# Patient Record
Sex: Male | Born: 1950
Health system: Southern US, Community
[De-identification: ages and names within clinical notes are randomized; demographics above are authoritative.]

## PROBLEM LIST (undated history)

## (undated) DIAGNOSIS — I209 Angina pectoris, unspecified: Secondary | ICD-10-CM

## (undated) DIAGNOSIS — M1990 Unspecified osteoarthritis, unspecified site: Secondary | ICD-10-CM

## (undated) DIAGNOSIS — E0590 Thyrotoxicosis, unspecified without thyrotoxic crisis or storm: Secondary | ICD-10-CM

## (undated) DIAGNOSIS — E7800 Pure hypercholesterolemia, unspecified: Secondary | ICD-10-CM

## (undated) DIAGNOSIS — K8020 Calculus of gallbladder without cholecystitis without obstruction: Secondary | ICD-10-CM

## (undated) DIAGNOSIS — K219 Gastro-esophageal reflux disease without esophagitis: Secondary | ICD-10-CM

## (undated) DIAGNOSIS — J449 Chronic obstructive pulmonary disease, unspecified: Secondary | ICD-10-CM

## (undated) DIAGNOSIS — D126 Benign neoplasm of colon, unspecified: Secondary | ICD-10-CM

## (undated) DIAGNOSIS — R51 Headache: Secondary | ICD-10-CM

## (undated) HISTORY — PX: BLADDER NECK RECONSTRUCTION: SHX1239

## (undated) HISTORY — DX: Benign neoplasm of colon, unspecified: D12.6

## (undated) HISTORY — PX: HAND SURGERY: SHX662

## (undated) HISTORY — DX: Calculus of gallbladder without cholecystitis without obstruction: K80.20

## (undated) HISTORY — PX: HERNIA REPAIR: SHX51

## (undated) HISTORY — PX: CARDIAC CATHETERIZATION: SHX172

## (undated) HISTORY — PX: TONSILLECTOMY AND ADENOIDECTOMY: SUR1326

---
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
## 1979-02-26 HISTORY — PX: HAND SURGERY: SHX662

## 1991-06-28 HISTORY — PX: CARPAL TUNNEL RELEASE: SHX101

## 1992-06-27 HISTORY — PX: ANTERIOR CERVICAL DECOMP/DISCECTOMY FUSION: SHX1161

## 1999-09-07 ENCOUNTER — Encounter: Payer: Self-pay | Admitting: Cardiology

## 1999-09-07 ENCOUNTER — Inpatient Hospital Stay (HOSPITAL_COMMUNITY): Admission: AD | Admit: 1999-09-07 | Discharge: 1999-09-08 | Payer: Self-pay | Admitting: Cardiology

## 2000-02-19 ENCOUNTER — Emergency Department (HOSPITAL_COMMUNITY): Admission: EM | Admit: 2000-02-19 | Discharge: 2000-02-19 | Payer: Self-pay | Admitting: Emergency Medicine

## 2000-02-19 ENCOUNTER — Encounter: Payer: Self-pay | Admitting: Emergency Medicine

## 2001-10-12 ENCOUNTER — Emergency Department (HOSPITAL_COMMUNITY): Admission: EM | Admit: 2001-10-12 | Discharge: 2001-10-12 | Payer: Self-pay | Admitting: Emergency Medicine

## 2001-10-12 ENCOUNTER — Encounter: Payer: Self-pay | Admitting: Emergency Medicine

## 2002-08-10 ENCOUNTER — Encounter: Payer: Self-pay | Admitting: Emergency Medicine

## 2002-08-10 ENCOUNTER — Emergency Department (HOSPITAL_COMMUNITY): Admission: EM | Admit: 2002-08-10 | Discharge: 2002-08-10 | Payer: Self-pay | Admitting: Emergency Medicine

## 2003-09-20 ENCOUNTER — Emergency Department (HOSPITAL_COMMUNITY): Admission: EM | Admit: 2003-09-20 | Discharge: 2003-09-21 | Payer: Self-pay | Admitting: Emergency Medicine

## 2005-08-02 ENCOUNTER — Ambulatory Visit: Payer: Self-pay

## 2007-10-22 ENCOUNTER — Ambulatory Visit: Payer: Self-pay | Admitting: Gastroenterology

## 2007-10-26 DIAGNOSIS — D126 Benign neoplasm of colon, unspecified: Secondary | ICD-10-CM

## 2007-10-26 HISTORY — DX: Benign neoplasm of colon, unspecified: D12.6

## 2007-11-02 ENCOUNTER — Ambulatory Visit: Payer: Self-pay | Admitting: Gastroenterology

## 2007-11-02 ENCOUNTER — Encounter: Payer: Self-pay | Admitting: Gastroenterology

## 2007-11-05 ENCOUNTER — Encounter: Payer: Self-pay | Admitting: Gastroenterology

## 2008-11-10 ENCOUNTER — Encounter: Admission: RE | Admit: 2008-11-10 | Discharge: 2008-11-10 | Payer: Self-pay | Admitting: Neurological Surgery

## 2008-11-15 ENCOUNTER — Emergency Department (HOSPITAL_COMMUNITY): Admission: EM | Admit: 2008-11-15 | Discharge: 2008-11-15 | Payer: Self-pay | Admitting: Emergency Medicine

## 2008-11-24 ENCOUNTER — Emergency Department (HOSPITAL_COMMUNITY): Admission: EM | Admit: 2008-11-24 | Discharge: 2008-11-25 | Payer: Self-pay | Admitting: Emergency Medicine

## 2010-11-12 NOTE — Cardiovascular Report (Signed)
[place]  Patient:    Gerald Salas, Gerald Salas                     MRN: [id number] Proc. Date: 09/08/99 Adm. Date:  [id number] Attending:  Talitha Givens CC:         Moshe Cipro. Gayla Doss, M.D.             Lewayne Bunting, M.D.                        Cardiac Catheterization  REFERRING PHYSICIAN:  Moshe Cipro. Gayla Doss, M.D.  DATE OF BIRTH:  July 27, 1950  DIAGNOSES: 1. No flow-limiting coronary artery disease. 2. Normal left ventricular contraction.  INDICATIONS:  [name] is a 60 year old white male with a history of tobacco use and no prior heart disease who presented with increased substernal chest pain over the last several days.  The patient was initially seen in the office and was given antianginal therapy.  Due to continued pain, however, the patient was admitted to the hospital and referred for diagnostic catheterization to access his coronary anatomy.  DESCRIPTION OF PROCEDURE:  After informed consent was obtained, the patient was  brought to the catheterization laboratory.  The right groin was sterilely prepared and draped.  Lidocaine 1% was used to infiltrate the right groin and a 6 French  arterial sheath was placed using modified Seldinger technique.  Subsequently, JL 3.5 and JR4 catheters were used to engage the left and right coronary arteries.  Selective angiography was performed in various projections using manual injections of contrast.  After coronary angiography, attention was turned to ventriculography and a 6 French pigtail catheter was advanced to the femoral artery into the left ventricle.  Appropriate left heart hemodynamics were obtained.  A left ventriculogram was then performed using power injections of contrast.  The catheter was then pulled back to evaluate for an aortic valve gradient.  After removal of the pigtail catheter, the sheath was removed and manual pressure applied until adequate hemostasis was  achieved.  The patient tolerated the procedure well and was transferred to the floor in stable condition.  FINDINGS: 1. Hemodynamics:  LV pressure is 85/16 mmHg.  Aortic pressure is 95/52 mmHg. 2. Ventriculography performed in single plane RAO projection.  No wall motion    abnormalities.  Left ventricular ejection fraction was 60%.  SELECTIVE CORONARY ANGIOGRAPHY: 1. Left main coronary artery was a large caliber vessel with no flow-limiting    disease. 2. Left anterior descending was a small caliber vessel with rapid tapering but    no flow-limiting coronary arteries. 3. Left circumflex coronary artery was a large vessel with two obtuse marginal    branches without flow-limiting coronary arteries. 4. Right coronary artery:  The coronary system is right dominant with large    posterior descending artery, 2+ marginal branches taken off from distal    right coronary artery.  There was no flow-limiting coronary artery disease.  CONCLUSIONS: 1. No evidence of flow-limiting epicardial coronary artery disease. 2. Normal left ventricular systolic function.  RECOMMENDATIONS:  The results have been discussed with the patient and his wife. The plan is to discharge the patient later tonight after adequate hemostasis has been achieved.  The patient will be followed by Dr. Andee Lineman in the future and will need further risk factor modification.  It appears that the patients substernal  chest pain, however, is not related to atherosclerotic coronary artery  disease.  DD:  09/08/99 TD:  09/08/99 Job: [id number]

## 2010-11-12 NOTE — Discharge Summary (Signed)
[place]  Patient:    Gerald Salas, Gerald Salas                     MRN: [id number] Adm. Date:  [id number] Disc. Date: 09/08/99 Attending:  Talitha Givens Dictator:   Joellyn Rued, P.A.C. CC:         Lewayne Bunting, M.D.             Moshe Cipro Gayla Doss, M.D. - Rosiland Oz                           Discharge Summary  BRIEF HISTORY:  [name] is a 60 year old white male who was referred by Dr. Lolita Lenz for evaluation and chest discomfort.  He describes the discomfort as increasing in frequency, for the past several weeks.  He called it a jabbing pain that suddenly occurs in the mid chest and lasts for only a second.  He has also noticed increasing fatigue over the last few weeks unassociated with the above symptoms.  He also describes the discomfort as a knife sticking in his chest.  He does not have any exertional component.  It is also reproducible on an abdominal exam.  PAST HISTORY:  Notable for a neck fracture in 1994 due to a work injury, tonsillectomy, hand surgery, tobacco abuse.  He was referred for a stress test but evidently due to his continuing chest discomfort he showed up at the emergency room for further evaluation.  LABORATORY DATA:  Serial CKs and enzymes negative for myocardial infarction. WBC 8.7, H&H 15.7/44.4 normal indices.  Platelets 258, sodium 140, potassium 4.2, glucose 92, BUN 12, creatinine 1.1.  Fasting cholesterols elevated 211, triglycerides at 239, HDL 35, LDL 128, ratio 6.0.  X-rays are not on the chart.  EKGs show sinus bradycardia, and LVH.  HOSPITAL COURSE:  [name] was admitted for cardiac catheterization.  This was performed on September 08, 1999.  This did not show any coronary artery disease and the ejection fraction was 60%.  Post sheath removal and bed rest it was felt that the patient could be discharged home with the diagnosis of:  DISCHARGE DIAGNOSES: 1. Noncardiac chest discomfort. 2.  Hyperlipidemia.  DISPOSITION:  He is discharged home and asked to continue his home medications which include Zantac.  He was asked not to take the beta blocker.  He is advised no lifting, driving, sexual activity, or heavy exertion for 2 days.  DIET: Maintain low salt, fat, cholesterol diet.  WOUND CARE:  If he has difficulty with his catheterization site he was asked to call immediately and advised to discontinue tobacco.  FOLLOWUP:  He will arrange a follow up appointment with Dr. Lolita Lenz.  We will leave it up to Dr. Lolita Lenz if he would like to begin cholesterol therapy. DD:  09/08/99 TD:  09/08/99 Job: [id number]

## 2010-12-25 ENCOUNTER — Emergency Department (HOSPITAL_COMMUNITY)
Admission: EM | Admit: 2010-12-25 | Discharge: 2010-12-25 | Disposition: A | Discharge status: Home / Self Care | Payer: Medicare Other | Attending: Emergency Medicine | Admitting: Emergency Medicine

## 2010-12-25 DIAGNOSIS — H5710 Ocular pain, unspecified eye: Secondary | ICD-10-CM | POA: Insufficient documentation

## 2010-12-25 DIAGNOSIS — S058X9A Other injuries of unspecified eye and orbit, initial encounter: Secondary | ICD-10-CM | POA: Insufficient documentation

## 2010-12-25 DIAGNOSIS — X58XXXA Exposure to other specified factors, initial encounter: Secondary | ICD-10-CM | POA: Insufficient documentation

## 2011-02-17 ENCOUNTER — Other Ambulatory Visit: Payer: Self-pay

## 2011-02-17 ENCOUNTER — Emergency Department (HOSPITAL_COMMUNITY): Payer: Medicare Other

## 2011-02-17 ENCOUNTER — Encounter: Payer: Self-pay | Admitting: Emergency Medicine

## 2011-02-17 ENCOUNTER — Emergency Department (HOSPITAL_COMMUNITY)
Admission: EM | Admit: 2011-02-17 | Discharge: 2011-02-17 | Disposition: A | Discharge status: Other Acute Inpatient Hospital | Payer: Medicare Other | Source: Home / Self Care | Attending: Emergency Medicine | Admitting: Emergency Medicine

## 2011-02-17 ENCOUNTER — Inpatient Hospital Stay (HOSPITAL_COMMUNITY)
Admission: AD | Admit: 2011-02-17 | Discharge: 2011-02-18 | DRG: 287 | Disposition: A | Discharge status: Home / Self Care | Payer: Medicare Other | Source: Other Acute Inpatient Hospital | Attending: Cardiovascular Disease | Admitting: Cardiovascular Disease

## 2011-02-17 DIAGNOSIS — F17200 Nicotine dependence, unspecified, uncomplicated: Secondary | ICD-10-CM | POA: Insufficient documentation

## 2011-02-17 DIAGNOSIS — R0789 Other chest pain: Principal | ICD-10-CM | POA: Diagnosis present

## 2011-02-17 DIAGNOSIS — E785 Hyperlipidemia, unspecified: Secondary | ICD-10-CM | POA: Diagnosis present

## 2011-02-17 DIAGNOSIS — R079 Chest pain, unspecified: Secondary | ICD-10-CM

## 2011-02-17 DIAGNOSIS — J449 Chronic obstructive pulmonary disease, unspecified: Secondary | ICD-10-CM | POA: Diagnosis present

## 2011-02-17 DIAGNOSIS — I200 Unstable angina: Secondary | ICD-10-CM

## 2011-02-17 DIAGNOSIS — I2510 Atherosclerotic heart disease of native coronary artery without angina pectoris: Secondary | ICD-10-CM | POA: Diagnosis present

## 2011-02-17 DIAGNOSIS — J4489 Other specified chronic obstructive pulmonary disease: Secondary | ICD-10-CM | POA: Diagnosis present

## 2011-02-17 DIAGNOSIS — E7800 Pure hypercholesterolemia, unspecified: Secondary | ICD-10-CM | POA: Insufficient documentation

## 2011-02-17 DIAGNOSIS — E0590 Thyrotoxicosis, unspecified without thyrotoxic crisis or storm: Secondary | ICD-10-CM | POA: Diagnosis present

## 2011-02-17 HISTORY — DX: Pure hypercholesterolemia, unspecified: E78.00

## 2011-02-17 LAB — CBC
Hemoglobin: 14.5 g/dL (ref 13.0–17.0)
MCH: 33.1 pg (ref 26.0–34.0)
Platelets: 194 10*3/uL (ref 150–400)
Platelets: 173 10*3/uL (ref 150–400)
RBC: 4.65 MIL/uL (ref 4.22–5.81)
RBC: 4.38 MIL/uL (ref 4.22–5.81)
RDW: 11.9 % (ref 11.5–15.5)
WBC: 9.1 10*3/uL (ref 4.0–10.5)
WBC: 9.1 10*3/uL (ref 4.0–10.5)

## 2011-02-17 LAB — BASIC METABOLIC PANEL
CO2: 28 mEq/L (ref 19–32)
CO2: 27 mEq/L (ref 19–32)
Calcium: 10.1 mg/dL (ref 8.4–10.5)
Calcium: 9.4 mg/dL (ref 8.4–10.5)
Chloride: 107 mEq/L (ref 96–112)
GFR calc non Af Amer: >60 mL/min (ref >60)
Glucose, Bld: 108 mg/dL — ABNORMAL HIGH (ref 70–99)
Glucose, Bld: 104 mg/dL — ABNORMAL HIGH (ref 70–99)
Potassium: 4.1 mEq/L (ref 3.5–5.1)
Potassium: 3.6 mEq/L (ref 3.5–5.1)
Sodium: 143 mEq/L (ref 135–145)
Sodium: 140 mEq/L (ref 135–145)

## 2011-02-17 LAB — CARDIAC PANEL(CRET KIN+CKTOT+MB+TROPI)
CK, MB: 2.5 ng/mL (ref 0.3–4.0)
CK, MB: 2 ng/mL (ref 0.3–4.0)
Relative Index: INVALID (ref 0.0–2.5)
Total CK: 83 U/L (ref 7–232)
Total CK: 74 U/L (ref 7–232)
Troponin I: <0.3 ng/mL (ref <0.30)
Troponin I: <0.3 ng/mL (ref <0.30)

## 2011-02-17 LAB — DIFFERENTIAL
Basophils Absolute: 0 10*3/uL (ref 0.0–0.1)
Eosinophils Absolute: 0.3 10*3/uL (ref 0.0–0.7)
Lymphocytes Relative: 31 % (ref 12–46)
Lymphs Abs: 2.8 10*3/uL (ref 0.7–4.0)
Neutrophils Relative %: 57 % (ref 43–77)

## 2011-02-17 LAB — PROTIME-INR
INR: 0.97 (ref 0.00–1.49)
Prothrombin Time: 13.1 seconds (ref 11.6–15.2)

## 2011-02-17 MED ORDER — ASPIRIN 81 MG PO CHEW
324.0000 mg | CHEWABLE_TABLET | Freq: Once | ORAL | Status: AC
  Administered 2011-02-17: 324 mg via ORAL
  Filled 2011-02-17: qty 4

## 2011-02-17 MED ORDER — HEPARIN BOLUS VIA INFUSION
4000.0000 [IU] | Freq: Once | INTRAVENOUS | Status: AC
  Administered 2011-02-17: 4000 [IU] via INTRAVENOUS

## 2011-02-17 MED ORDER — NITROGLYCERIN 0.4 MG SL SUBL
0.4000 mg | SUBLINGUAL_TABLET | Freq: Once | SUBLINGUAL | Status: AC
  Administered 2011-02-17: 0.4 mg via SUBLINGUAL
  Filled 2011-02-17: qty 25

## 2011-02-17 MED ORDER — HEPARIN (PORCINE) IN NACL 100-0.45 UNIT/ML-% IJ SOLN
1000.0000 [IU]/h | INTRAMUSCULAR | Status: DC
  Administered 2011-02-17: 1000 [IU]/h via INTRAVENOUS
  Filled 2011-02-17: qty 250

## 2011-02-17 NOTE — ED Notes (Signed)
C/o mid sternal chest pain--onset this a.m. After awakening.  Describes as sharp, nonradiating and intermittent---no relieving factors--rates 5 on 1-10 scale--also reports SOA and nausea with pain.  He had a heart cath back in the 90"s which showed no blockages.

## 2011-02-17 NOTE — ED Notes (Signed)
Pt c/o cp since 0700 with sob/sweating. Denies n/v. Pt states he took tums with no relief.

## 2011-02-17 NOTE — ED Provider Notes (Signed)
History     CSN: [id number] Arrival date & time: 02/17/2011  1:01 PM  Chief Complaint  Patient presents with  . Chest Pain   Patient is a 60 y.o. male presenting with chest pain. The history is provided by the patient.  Chest Pain The chest pain began 6 - 12 hours ago. Chest pain occurs constantly. The chest pain is improving. The severity of the pain is moderate. The quality of the pain is described as dull. The pain does not radiate. Primary symptoms include shortness of breath. Pertinent negatives for primary symptoms include no cough, no abdominal pain, no nausea and no vomiting.  Pertinent negatives for associated symptoms include no numbness and no weakness. He tried antacids for the symptoms.   patient has had chest pain since he woke up this morning. He states that it is pressure over his lower chest. No relief with tums. He states that it doesn't feel like his previous GERD. He states that he was sweating. Mild dyspnea. No cough. He is a smoker.   Past Medical History  Diagnosis Date  . High cholesterol     Past Surgical History  Procedure Date  . Neck surgery     Family History  Problem Relation Age of Onset  . Cancer Mother   . Cancer Father   . Cancer Sister     History  Substance Use Topics  . Smoking status: Current Everyday Smoker -- 1.0 packs/day  . Smokeless tobacco: Not on file  . Alcohol Use: No      Review of Systems  Constitutional: Negative for activity change and appetite change.  HENT: Negative for neck stiffness.   Eyes: Negative for pain.  Respiratory: Positive for chest tightness and shortness of breath. Negative for cough.   Cardiovascular: Positive for chest pain. Negative for leg swelling.  Gastrointestinal: Negative for nausea, vomiting, abdominal pain and diarrhea.  Genitourinary: Negative for flank pain.  Musculoskeletal: Negative for back pain.  Skin: Negative for rash.  Neurological: Negative for weakness, numbness and headaches.    Psychiatric/Behavioral: Negative for behavioral problems.    Physical Exam  BP 146/73  Pulse 77  Temp 98.3 F (36.8 C)  Resp 18  Ht 5\' 11"  (1.803 m)  Wt 185 lb (83.915 kg)  BMI 25.80 kg/m2  SpO2 97%  Physical Exam  Nursing note and vitals reviewed. Constitutional: He is oriented to person, place, and time. He appears well-developed and well-nourished.  HENT:  Head: Normocephalic and atraumatic.  Eyes: EOM are normal. Pupils are equal, round, and reactive to light.  Neck: Normal range of motion. Neck supple.  Cardiovascular: Normal rate, regular rhythm and normal heart sounds.   No murmur heard. Pulmonary/Chest: Effort normal and breath sounds normal.  Abdominal: Soft. Bowel sounds are normal. He exhibits no distension and no mass. There is no tenderness. There is no rebound and no guarding.  Musculoskeletal: Normal range of motion. He exhibits no edema.  Neurological: He is alert and oriented to person, place, and time. No cranial nerve deficit.  Skin: Skin is warm and dry.    ED Course  Procedures    Date: 02/17/2011  Rate: 69  Rhythm: normal sinus rhythm  QRS Axis: normal  Intervals: normal  ST/T Wave abnormalities: normal  Conduction Disutrbances:none  Narrative Interpretation:   Old EKG Reviewed: unchanged Patient's Medications  New Prescriptions   No medications on file  Previous Medications   ACETAMINOPHEN (TYLENOL) 500 MG TABLET    Take 1,000 mg by mouth  every 6 (six) hours as needed. For pain    ASPIRIN-SALICYLAMIDE-CAFFEINE (BC HEADACHE POWDER PO)    Take 1 packet by mouth every 8 (eight) hours as needed. For pain   Modified Medications   No medications on file  Discontinued Medications   No medications on file    Results for orders placed during the hospital encounter of 02/17/11  CARDIAC PANEL(CRET KIN+CKTOT+MB+TROPI)      Component Value Range   Total CK 83  7 - 232 (U/L)   CK, MB 2.5  0.3 - 4.0 (ng/mL)   Troponin I <0.30  <0.30 (ng/mL)    Relative Index RELATIVE INDEX IS INVALID  0.0 - 2.5   CBC      Component Value Range   WBC 9.1  4.0 - 10.5 (K/uL)   RBC 4.65  4.22 - 5.81 (MIL/uL)   Hemoglobin 15.3  13.0 - 17.0 (g/dL)   HCT 16.1  09.6 - 04.5 (%)   MCV 95.5  78.0 - 100.0 (fL)   MCH 32.9  26.0 - 34.0 (pg)   MCHC 34.5  30.0 - 36.0 (g/dL)   RDW 40.9  81.1 - 91.4 (%)   Platelets 194  150 - 400 (K/uL)  DIFFERENTIAL      Component Value Range   Neutrophils Relative 57  43 - 77 (%)   Neutro Abs 5.2  1.7 - 7.7 (K/uL)   Lymphocytes Relative 31  12 - 46 (%)   Lymphs Abs 2.8  0.7 - 4.0 (K/uL)   Monocytes Relative 8  3 - 12 (%)   Monocytes Absolute 0.7  0.1 - 1.0 (K/uL)   Eosinophils Relative 4  0 - 5 (%)   Eosinophils Absolute 0.3  0.0 - 0.7 (K/uL)   Basophils Relative 0  0 - 1 (%)   Basophils Absolute 0.0  0.0 - 0.1 (K/uL)  BASIC METABOLIC PANEL      Component Value Range   Sodium 143  135 - 145 (mEq/L)   Potassium 4.1  3.5 - 5.1 (mEq/L)   Chloride 107  96 - 112 (mEq/L)   CO2 28  19 - 32 (mEq/L)   Glucose, Bld 108 (*) 70 - 99 (mg/dL)   BUN 16  6 - 23 (mg/dL)   Creatinine, Ser 7.82  0.50 - 1.35 (mg/dL)   Calcium 95.6  8.4 - 10.5 (mg/dL)   GFR calc non Af Amer >60  >60 (mL/min)   GFR calc Af Amer >60  >60 (mL/min)   Dg Chest 2 View  02/17/2011  *RADIOLOGY REPORT*  Clinical Data: Chest pain.  CHEST - 2 VIEW  Comparison: None  Findings: The cardiac silhouette, mediastinal and hilar contours are within normal limits.  There is mild diffuse pleural thickening.  No definite pleural effusion and no acute pulmonary findings.  The bony thorax is intact.  IMPRESSION:  1.  No acute cardiopulmonary findings. 2.  Mild diffuse pleural thickening.  Original Report Authenticated By: P. Loralie Champagne, M.D.      MDM Chest pain. EKG reassuring. Blood reassuring. Worrisome story. D.w Dr Elease Hashimoto. tranfered to [facility] for further management.       Juliet Rude. Rubin Payor, MD 02/17/11 1759

## 2011-02-17 NOTE — ED Notes (Addendum)
B/P 118/65  SR 70 on monitor--reports complete resolution of chest pain after 1 NTG SL.--also placed on 2 liters 02--pulse 0x 97% after o2

## 2011-02-17 NOTE — ED Notes (Signed)
Continues to report resolution of chest pain---awaiting lab results

## 2011-02-18 DIAGNOSIS — I2510 Atherosclerotic heart disease of native coronary artery without angina pectoris: Secondary | ICD-10-CM

## 2011-02-18 LAB — CBC
HCT: 42.4 % (ref 39.0–52.0)
Hemoglobin: 14.6 g/dL (ref 13.0–17.0)
MCH: 32.2 pg (ref 26.0–34.0)
MCHC: 34.4 g/dL (ref 30.0–36.0)
MCV: 93.6 fL (ref 78.0–100.0)
RDW: 12 % (ref 11.5–15.5)

## 2011-02-18 LAB — LIPID PANEL
Cholesterol: 150 mg/dL (ref 0–200)
HDL: 33 mg/dL — ABNORMAL LOW (ref >39)
LDL Cholesterol: 86 mg/dL (ref 0–99)
Triglycerides: 156 mg/dL — ABNORMAL HIGH (ref <150)
VLDL: 31 mg/dL (ref 0–40)

## 2011-02-18 LAB — CARDIAC PANEL(CRET KIN+CKTOT+MB+TROPI)
CK, MB: 2 ng/mL (ref 0.3–4.0)
CK, MB: 1.9 ng/mL (ref 0.3–4.0)

## 2011-02-18 LAB — POCT ACTIVATED CLOTTING TIME: Activated Clotting Time: 133 seconds

## 2011-02-18 LAB — HEMOGLOBIN A1C: Mean Plasma Glucose: 123 mg/dL — ABNORMAL HIGH (ref <117)

## 2011-02-19 ENCOUNTER — Emergency Department (HOSPITAL_COMMUNITY): Payer: Medicare Other

## 2011-02-19 ENCOUNTER — Emergency Department (HOSPITAL_COMMUNITY)
Admission: EM | Admit: 2011-02-19 | Discharge: 2011-02-19 | Disposition: A | Discharge status: Home / Self Care | Payer: Medicare Other | Attending: Emergency Medicine | Admitting: Emergency Medicine

## 2011-02-19 ENCOUNTER — Other Ambulatory Visit: Payer: Self-pay

## 2011-02-19 ENCOUNTER — Encounter (HOSPITAL_COMMUNITY): Payer: Self-pay

## 2011-02-19 DIAGNOSIS — R079 Chest pain, unspecified: Secondary | ICD-10-CM | POA: Insufficient documentation

## 2011-02-19 DIAGNOSIS — F17200 Nicotine dependence, unspecified, uncomplicated: Secondary | ICD-10-CM | POA: Insufficient documentation

## 2011-02-19 DIAGNOSIS — K859 Acute pancreatitis without necrosis or infection, unspecified: Secondary | ICD-10-CM | POA: Insufficient documentation

## 2011-02-19 LAB — POCT I-STAT TROPONIN I

## 2011-02-19 LAB — DIFFERENTIAL
Lymphs Abs: 2.8 10*3/uL (ref 0.7–4.0)
Monocytes Relative: 8 % (ref 3–12)
Neutro Abs: 7.3 10*3/uL (ref 1.7–7.7)
Neutrophils Relative %: 64 % (ref 43–77)

## 2011-02-19 LAB — BASIC METABOLIC PANEL
BUN: 14 mg/dL (ref 6–23)
Chloride: 103 mEq/L (ref 96–112)
GFR calc Af Amer: >60 mL/min (ref >60)
Glucose, Bld: 93 mg/dL (ref 70–99)
Potassium: 3.7 mEq/L (ref 3.5–5.1)
Sodium: 140 mEq/L (ref 135–145)

## 2011-02-19 LAB — CBC
Hemoglobin: 16 g/dL (ref 13.0–17.0)
Platelets: 208 10*3/uL (ref 150–400)
RBC: 4.88 MIL/uL (ref 4.22–5.81)
WBC: 11.3 10*3/uL — ABNORMAL HIGH (ref 4.0–10.5)

## 2011-02-19 LAB — HEPATIC FUNCTION PANEL
ALT: 32 U/L (ref 0–53)
Bilirubin, Direct: 0.1 mg/dL (ref 0.0–0.3)
Indirect Bilirubin: 0.3 mg/dL (ref 0.3–0.9)
Total Protein: 7.5 g/dL (ref 6.0–8.3)

## 2011-02-19 LAB — LIPASE, BLOOD: Lipase: 67 U/L — ABNORMAL HIGH (ref 11–59)

## 2011-02-19 LAB — D-DIMER, QUANTITATIVE: D-Dimer, Quant: 0.41 ug/mL-FEU (ref 0.00–0.48)

## 2011-02-19 MED ORDER — GI COCKTAIL ~~LOC~~
30.0000 mL | Freq: Once | ORAL | Status: AC
  Administered 2011-02-19: 30 mL via ORAL
  Filled 2011-02-19: qty 30

## 2011-02-19 MED ORDER — PROMETHAZINE HCL 25 MG PO TABS: 25.0000 mg | ORAL_TABLET | Freq: Four times a day (QID) | ORAL | Status: DC | PRN

## 2011-02-19 MED ORDER — HYDROCODONE-ACETAMINOPHEN 5-325 MG PO TABS: 2.0000 | ORAL_TABLET | ORAL | Status: AC | PRN

## 2011-02-19 MED ORDER — ASPIRIN 81 MG PO CHEW
324.0000 mg | CHEWABLE_TABLET | Freq: Once | ORAL | Status: AC
  Administered 2011-02-19: 324 mg via ORAL
  Filled 2011-02-19: qty 4

## 2011-02-19 MED ORDER — HYDROMORPHONE HCL 1 MG/ML IJ SOLN
1.0000 mg | Freq: Once | INTRAMUSCULAR | Status: AC
  Administered 2011-02-19: 1 mg via INTRAVENOUS
  Filled 2011-02-19: qty 1

## 2011-02-19 NOTE — ED Notes (Signed)
PT reports had a cardiac cath yesterday and was told had a small blockage but was  "nothing to worry about" per family.  Pt says had a little chest pain when woke up this am but got much worse after eating a sausage biscuit.  Pt took 3 tums without relief, a capful of vinegar and around noon took ranitidine.

## 2011-02-19 NOTE — ED Provider Notes (Signed)
Scribed for [employer]. Bernette Mayers, MD, the patient was seen in room APA01/APA01. This chart was scribed by [employer]. The patient's care started at 13:32  CSN: [id number] Arrival date & time: 02/19/2011  1:12 PM  Chief Complaint  Patient presents with  . Chest Pain   HPI Gerald Salas is a 60 y.o. male who presents to the Emergency Department complaining of waxing and waning Chest pain starting AM of 02/19/2011. Patient states that pain starts in his (epigastric) abdomen and radiates to his chest. He states that pain got worse after eating a sausage biscuit. Patient denies back pain, shortness of breath He reports that he had a cardiac cath yesterday and was told there was nothing to worry about. There are no other associated symptoms and no other alleviating or aggravating factors. HPI ELEMENTS:  Location: Chest  Onset: AM of 02/19/2011 Timing: waxing and waning Context:  as above  Associated symptoms: as above    Past Medical History  Diagnosis Date  . High cholesterol     Past Surgical History  Procedure Date  . Neck surgery   . Cardiac catheterization     Family History  Problem Relation Age of Onset  . Cancer Mother   . Cancer Father   . Cancer Sister     History  Substance Use Topics  . Smoking status: Current Everyday Smoker -- 1.0 packs/day  . Smokeless tobacco: Not on file  . Alcohol Use: No      Review of Systems  Respiratory: Negative for shortness of breath.   Gastrointestinal: Negative for abdominal pain.  Musculoskeletal: Negative for back pain.  All other systems reviewed and are negative.    Physical Exam  BP 141/82  Pulse 59  Temp(Src) 98.1 F (36.7 C) (Oral)  Resp 22  Ht 5\' 11"  (1.803 m)  Wt 182 lb (82.555 kg)  BMI 25.38 kg/m2  Physical Exam  Constitutional: He is oriented to person, place, and time. He appears well-developed and well-nourished. He appears distressed.  HENT:  Head: Normocephalic and atraumatic.  Eyes: EOM are  normal. Pupils are equal, round, and reactive to light.  Neck: Normal range of motion. Neck supple. No tracheal deviation present.  Cardiovascular: Normal rate and normal heart sounds.   Pulmonary/Chest: Effort normal and breath sounds normal. No respiratory distress. He has no wheezes.  Abdominal: Soft. Bowel sounds are normal. He exhibits no distension. There is no tenderness. There is no rebound.  Musculoskeletal: He exhibits no edema and no tenderness.  Neurological: He is alert and oriented to person, place, and time. No cranial nerve deficit.  Skin: Skin is warm and dry. No rash noted. He is not diaphoretic. No erythema.  Psychiatric: He has a normal mood and affect. His behavior is normal.    ED Course  Procedures  OTHER DATA REVIEWED: Nursing notes, vital signs, and past medical records reviewed.   LABS / RADIOLOGY: Reviewed by me  Dg Chest Portable 1 View  02/19/2011  *RADIOLOGY REPORT*  Clinical Data: Chest pain  PORTABLE CHEST - 1 VIEW  Comparison: 02/17/2011  Findings: Coarse perihilar and bibasilar interstitial markings without confluent airspace infiltrate or overt edema.  No effusion. Heart size within normal limits for technique.  Regional bones unremarkable.  IMPRESSION:  1.  Chronic parenchymal changes without acute or superimposed abnormality.  Original Report Authenticated By: Osa Craver, M.D.     MDM: Pt with neg cath yesterday, doubt MI or CAD as the source of his pain.  D-dimer is neg. No concern for aortic dissection or injury from cath. Suspect GI source, lipase is slightly elevated, could be mild pancreatitis.   I personally performed the services described in the documentation, which were scribed in my presence. The recorded information has been reviewed and considered.   SHELDON,CHARLES B.   3:01 PM Pt feeling much better, resting comfortably. Offered admission for further eval of pancreatitis but he would prefer to go home. Advised clear liquids  for 48hrs and pain meds as needed. Advised to return for further eval if symptoms persist. Will likely need gall bladder workup as an outpatient as well.     Charles B. Bernette Mayers, MD 02/19/11 [phone]

## 2011-02-19 NOTE — ED Notes (Signed)
Pt says feels much better almost immediately after medications given.  Pain decreased from 10 to 2.

## 2011-02-21 ENCOUNTER — Other Ambulatory Visit: Payer: Self-pay | Admitting: Internal Medicine

## 2011-02-22 ENCOUNTER — Ambulatory Visit
Admission: RE | Admit: 2011-02-22 | Discharge: 2011-02-22 | Disposition: A | Discharge status: Home / Self Care | Payer: Medicare Other | Source: Ambulatory Visit | Attending: Internal Medicine | Admitting: Internal Medicine

## 2011-02-22 NOTE — Cardiovascular Report (Signed)
  NAMEAZAN, MANERI                ACCOUNT NO.:  [id number]  MEDICAL RECORD NO.:  [id number]  LOCATION:  3740                         FACILITY:  [facility]  PHYSICIAN:  Noralyn Pick. Eden Emms, MD, FACCDATE OF BIRTH:  Jul 26, 1950  DATE OF PROCEDURE: DATE OF DISCHARGE:  02/18/2011                           CARDIAC CATHETERIZATION   INDICATIONS:  A 60 year old patient with chest pain.  Cine catheterization was done with 5-French sheath from right femoral artery. At the end of the case, the arteriotomy was closed using ExoSeal with good hemostasis.  Left main coronary artery was normal.  Left anterior descending artery had a 30% tubular lesion proximally. Mid and distal vessel were normal.  There was a medium-sized intermediate branch which was normal.  There were 2 small diagonal branches which were normal.  Circumflex coronary artery was nondominant and normal.  First and second obtuse marginal branches were normal.  Right coronary artery was dominant and normal.  RAO ventriculography.  RAO ventriculography was normal.  There were no wall motion abnormalities.  EF was 60%. There was no gradient across the aortic valve and no MR.  LV pressure was 104/3 with a post A-wave EDP of 14.  Aortic pressure was 108/58.  IMPRESSION:  The patient's chest pain would appear to be noncardiac in etiology.  Smoking cessation counseling has already been given.  We had good hemostasis with ExoSeal.  He will be discharged later today to follow up with his primary care doctor.     Noralyn Pick. Eden Emms, MD, [facility]     PCN/MEDQ  D:  02/18/2011  T:  02/18/2011  Job:  [id number]  Electronically Signed by Charlton Haws MD [facility] on 02/22/2011 02:03:47 PM

## 2011-02-26 ENCOUNTER — Emergency Department (HOSPITAL_COMMUNITY): Payer: Medicare Other

## 2011-02-26 ENCOUNTER — Inpatient Hospital Stay (HOSPITAL_COMMUNITY)
Admission: EM | Admit: 2011-02-26 | Discharge: 2011-02-28 | DRG: 392 | Disposition: A | Discharge status: Home / Self Care | Payer: Medicare Other | Attending: Family Medicine | Admitting: Family Medicine

## 2011-02-26 DIAGNOSIS — F17200 Nicotine dependence, unspecified, uncomplicated: Secondary | ICD-10-CM | POA: Diagnosis present

## 2011-02-26 DIAGNOSIS — N281 Cyst of kidney, acquired: Secondary | ICD-10-CM | POA: Diagnosis present

## 2011-02-26 DIAGNOSIS — E785 Hyperlipidemia, unspecified: Secondary | ICD-10-CM | POA: Diagnosis present

## 2011-02-26 DIAGNOSIS — J449 Chronic obstructive pulmonary disease, unspecified: Secondary | ICD-10-CM | POA: Diagnosis present

## 2011-02-26 DIAGNOSIS — I2510 Atherosclerotic heart disease of native coronary artery without angina pectoris: Secondary | ICD-10-CM | POA: Diagnosis present

## 2011-02-26 DIAGNOSIS — D72829 Elevated white blood cell count, unspecified: Secondary | ICD-10-CM | POA: Diagnosis present

## 2011-02-26 DIAGNOSIS — E039 Hypothyroidism, unspecified: Secondary | ICD-10-CM | POA: Diagnosis present

## 2011-02-26 DIAGNOSIS — R109 Unspecified abdominal pain: Principal | ICD-10-CM | POA: Diagnosis present

## 2011-02-26 DIAGNOSIS — K7689 Other specified diseases of liver: Secondary | ICD-10-CM | POA: Diagnosis present

## 2011-02-26 DIAGNOSIS — K2970 Gastritis, unspecified, without bleeding: Secondary | ICD-10-CM | POA: Diagnosis present

## 2011-02-26 DIAGNOSIS — Z886 Allergy status to analgesic agent status: Secondary | ICD-10-CM

## 2011-02-26 DIAGNOSIS — J4489 Other specified chronic obstructive pulmonary disease: Secondary | ICD-10-CM | POA: Diagnosis present

## 2011-02-26 LAB — URINE MICROSCOPIC-ADD ON

## 2011-02-26 LAB — CBC
Hemoglobin: 15.9 g/dL (ref 13.0–17.0)
MCH: 33.8 pg (ref 26.0–34.0)
Platelets: 203 10*3/uL (ref 150–400)
RBC: 4.71 MIL/uL (ref 4.22–5.81)
WBC: 15.5 10*3/uL — ABNORMAL HIGH (ref 4.0–10.5)

## 2011-02-26 LAB — URINALYSIS, ROUTINE W REFLEX MICROSCOPIC
Glucose, UA: 250 mg/dL — AB
Ketones, ur: 15 mg/dL — AB
Leukocytes, UA: NEGATIVE
Nitrite: NEGATIVE
Protein, ur: NEGATIVE mg/dL
Specific Gravity, Urine: 1.025 (ref 1.005–1.030)
Urobilinogen, UA: 1 mg/dL (ref 0.0–1.0)
pH: 5.5 (ref 5.0–8.0)

## 2011-02-26 LAB — COMPREHENSIVE METABOLIC PANEL
ALT: 35 U/L (ref 0–53)
AST: 18 U/L (ref 0–37)
Alkaline Phosphatase: 26 U/L — ABNORMAL LOW (ref 39–117)
CO2: 26 mEq/L (ref 19–32)
Calcium: 9.9 mg/dL (ref 8.4–10.5)
Chloride: 106 mEq/L (ref 96–112)
GFR calc Af Amer: >60 mL/min (ref >60)
GFR calc non Af Amer: >60 mL/min (ref >60)
Glucose, Bld: 97 mg/dL (ref 70–99)
Potassium: 4.1 mEq/L (ref 3.5–5.1)
Sodium: 142 mEq/L (ref 135–145)
Total Bilirubin: 0.3 mg/dL (ref 0.3–1.2)

## 2011-02-26 LAB — DIFFERENTIAL
Basophils Absolute: 0 10*3/uL (ref 0.0–0.1)
Basophils Relative: 0 % (ref 0–1)
Eosinophils Absolute: 0.3 10*3/uL (ref 0.0–0.7)
Monocytes Relative: 7 % (ref 3–12)
Neutro Abs: 12.3 10*3/uL — ABNORMAL HIGH (ref 1.7–7.7)
Neutrophils Relative %: 79 % — ABNORMAL HIGH (ref 43–77)

## 2011-02-26 LAB — POCT I-STAT TROPONIN I

## 2011-02-26 MED ORDER — IOHEXOL 300 MG/ML  SOLN
80.0000 mL | Freq: Once | INTRAMUSCULAR | Status: AC | PRN
  Administered 2011-02-26: 80 mL via INTRAVENOUS

## 2011-02-27 DIAGNOSIS — J449 Chronic obstructive pulmonary disease, unspecified: Secondary | ICD-10-CM

## 2011-02-27 DIAGNOSIS — R109 Unspecified abdominal pain: Secondary | ICD-10-CM

## 2011-02-27 LAB — COMPREHENSIVE METABOLIC PANEL
Albumin: 3.4 g/dL — ABNORMAL LOW (ref 3.5–5.2)
Alkaline Phosphatase: 25 U/L — ABNORMAL LOW (ref 39–117)
BUN: 11 mg/dL (ref 6–23)
Chloride: 100 mEq/L (ref 96–112)
Glucose, Bld: 97 mg/dL (ref 70–99)
Potassium: 3.9 mEq/L (ref 3.5–5.1)
Total Bilirubin: 0.6 mg/dL (ref 0.3–1.2)

## 2011-02-27 LAB — LIPASE, BLOOD: Lipase: 86 U/L — ABNORMAL HIGH (ref 11–59)

## 2011-02-27 LAB — CBC
HCT: 40.1 % (ref 39.0–52.0)
Hemoglobin: 13.8 g/dL (ref 13.0–17.0)
RDW: 12.1 % (ref 11.5–15.5)
WBC: 13.3 10*3/uL — ABNORMAL HIGH (ref 4.0–10.5)

## 2011-02-28 LAB — CBC
MCV: 94.8 fL (ref 78.0–100.0)
Platelets: 186 10*3/uL (ref 150–400)
RBC: 4.25 MIL/uL (ref 4.22–5.81)
WBC: 9.6 10*3/uL (ref 4.0–10.5)

## 2011-02-28 LAB — BASIC METABOLIC PANEL
BUN: 12 mg/dL (ref 6–23)
Chloride: 106 mEq/L (ref 96–112)
GFR calc Af Amer: >60 mL/min (ref >60)
Potassium: 4.1 mEq/L (ref 3.5–5.1)
Sodium: 141 mEq/L (ref 135–145)

## 2011-02-28 LAB — URINE CULTURE: Culture  Setup Time: 201209012108

## 2011-02-28 LAB — AMYLASE: Amylase: 67 U/L (ref 0–105)

## 2011-02-28 LAB — LIPASE, BLOOD: Lipase: 69 U/L — ABNORMAL HIGH (ref 11–59)

## 2011-03-01 ENCOUNTER — Telehealth: Payer: Self-pay | Admitting: Family Medicine

## 2011-03-01 LAB — H. PYLORI ANTIBODY, IGG: H Pylori IgG: 3.94 {ISR} — ABNORMAL HIGH

## 2011-03-01 NOTE — Telephone Encounter (Signed)
Dr. Alvester Morin saw this patient in the hospital and prescribed some medications.  [facility] is needing clarification on Phenergan before they can fill the med.

## 2011-03-01 NOTE — Telephone Encounter (Signed)
Called pharmacist. Dr.Newton rx'd Phenergan 12.5mg  one every 6 hrs prn. Did not put quantity. Told the pharmacist #[phone] and no additional refills.  Fwd. To Dr.Newton for review and info. Lorenda Hatchet, Renato Battles

## 2011-03-02 ENCOUNTER — Inpatient Hospital Stay (HOSPITAL_COMMUNITY)
Admission: EM | Admit: 2011-03-02 | Discharge: 2011-03-03 | DRG: 392 | Disposition: A | Discharge status: Home / Self Care | Payer: Medicare Other | Attending: General Surgery | Admitting: General Surgery

## 2011-03-02 ENCOUNTER — Encounter (HOSPITAL_COMMUNITY)
Admission: EM | Disposition: A | Discharge status: Home / Self Care | Payer: Self-pay | Source: Home / Self Care | Attending: General Surgery

## 2011-03-02 ENCOUNTER — Encounter (HOSPITAL_COMMUNITY): Payer: Self-pay

## 2011-03-02 ENCOUNTER — Emergency Department (HOSPITAL_COMMUNITY): Payer: Medicare Other

## 2011-03-02 ENCOUNTER — Other Ambulatory Visit: Payer: Self-pay

## 2011-03-02 DIAGNOSIS — E785 Hyperlipidemia, unspecified: Secondary | ICD-10-CM | POA: Diagnosis present

## 2011-03-02 DIAGNOSIS — K8020 Calculus of gallbladder without cholecystitis without obstruction: Secondary | ICD-10-CM | POA: Diagnosis present

## 2011-03-02 DIAGNOSIS — J449 Chronic obstructive pulmonary disease, unspecified: Secondary | ICD-10-CM | POA: Diagnosis present

## 2011-03-02 DIAGNOSIS — K2970 Gastritis, unspecified, without bleeding: Principal | ICD-10-CM | POA: Diagnosis present

## 2011-03-02 DIAGNOSIS — R109 Unspecified abdominal pain: Secondary | ICD-10-CM

## 2011-03-02 DIAGNOSIS — J4489 Other specified chronic obstructive pulmonary disease: Secondary | ICD-10-CM | POA: Diagnosis present

## 2011-03-02 HISTORY — DX: Chronic obstructive pulmonary disease, unspecified: J44.9

## 2011-03-02 HISTORY — PX: ESOPHAGOGASTRODUODENOSCOPY: SHX5428

## 2011-03-02 LAB — COMPREHENSIVE METABOLIC PANEL
ALT: 25 U/L (ref 0–53)
AST: 14 U/L (ref 0–37)
Alkaline Phosphatase: 30 U/L — ABNORMAL LOW (ref 39–117)
CO2: 24 mEq/L (ref 19–32)
GFR calc Af Amer: >60 mL/min (ref >60)
GFR calc non Af Amer: >60 mL/min (ref >60)
Glucose, Bld: 105 mg/dL — ABNORMAL HIGH (ref 70–99)
Potassium: 3.9 mEq/L (ref 3.5–5.1)
Sodium: 136 mEq/L (ref 135–145)
Total Protein: 7.5 g/dL (ref 6.0–8.3)

## 2011-03-02 LAB — CBC
Platelets: 234 10*3/uL (ref 150–400)
RBC: 4.77 MIL/uL (ref 4.22–5.81)
RDW: 11.7 % (ref 11.5–15.5)
WBC: 10.6 10*3/uL — ABNORMAL HIGH (ref 4.0–10.5)

## 2011-03-02 LAB — DIFFERENTIAL
Basophils Absolute: 0 10*3/uL (ref 0.0–0.1)
Lymphocytes Relative: 28 % (ref 12–46)
Lymphs Abs: 3 10*3/uL (ref 0.7–4.0)
Neutrophils Relative %: 60 % (ref 43–77)

## 2011-03-02 SURGERY — EGD (ESOPHAGOGASTRODUODENOSCOPY)
Anesthesia: Moderate Sedation

## 2011-03-02 MED ORDER — MIDAZOLAM HCL 5 MG/5ML IJ SOLN
INTRAMUSCULAR | Status: AC
  Filled 2011-03-02: qty 10

## 2011-03-02 MED ORDER — PANTOPRAZOLE SODIUM 40 MG IV SOLR
40.0000 mg | Freq: Once | INTRAVENOUS | Status: AC
  Administered 2011-03-02: 40 mg via INTRAVENOUS
  Filled 2011-03-02: qty 40

## 2011-03-02 MED ORDER — ENOXAPARIN SODIUM 80 MG/0.8ML ~~LOC~~ SOLN: 40.0000 mg | SUBCUTANEOUS | Status: DC

## 2011-03-02 MED ORDER — HYDROMORPHONE HCL 1 MG/ML IJ SOLN
1.0000 mg | Freq: Once | INTRAMUSCULAR | Status: AC
  Administered 2011-03-02: 1 mg via INTRAVENOUS
  Filled 2011-03-02: qty 1

## 2011-03-02 MED ORDER — HYDROMORPHONE HCL 1 MG/ML IJ SOLN
2.0000 mg | INTRAMUSCULAR | Status: DC | PRN
  Administered 2011-03-02: 1 mg via INTRAVENOUS
  Filled 2011-03-02: qty 1

## 2011-03-02 MED ORDER — MEPERIDINE HCL 25 MG/ML IJ SOLN
INTRAMUSCULAR | Status: DC | PRN
  Administered 2011-03-02: 50 mg via INTRAVENOUS

## 2011-03-02 MED ORDER — CHOLESTYRAMINE LIGHT 4 G PO PACK
4.0000 g | PACK | Freq: Three times a day (TID) | ORAL | Status: DC
  Administered 2011-03-02 – 2011-03-03 (×3): 4 g via ORAL
  Filled 2011-03-02 (×6): qty 1

## 2011-03-02 MED ORDER — LACTATED RINGERS IV SOLN
INTRAVENOUS | Status: DC
  Administered 2011-03-02: 09:00:00 via INTRAVENOUS
  Administered 2011-03-03: 1000 mL via INTRAVENOUS

## 2011-03-02 MED ORDER — MEPERIDINE HCL 100 MG/ML IJ SOLN
INTRAMUSCULAR | Status: AC
  Filled 2011-03-02: qty 2

## 2011-03-02 MED ORDER — PANTOPRAZOLE SODIUM 40 MG IV SOLR
40.0000 mg | INTRAVENOUS | Status: DC
  Administered 2011-03-03: 40 mg via INTRAVENOUS
  Filled 2011-03-02: qty 40

## 2011-03-02 MED ORDER — GI COCKTAIL ~~LOC~~: 30.0000 mL | Freq: Once | ORAL | Status: DC

## 2011-03-02 MED ORDER — HYDROMORPHONE HCL 1 MG/ML IJ SOLN
1.0000 mg | INTRAMUSCULAR | Status: DC | PRN
  Administered 2011-03-02: 1 mg via INTRAVENOUS
  Filled 2011-03-02: qty 1

## 2011-03-02 MED ORDER — GI COCKTAIL ~~LOC~~
30.0000 mL | Freq: Once | ORAL | Status: AC
  Administered 2011-03-02: 30 mL via ORAL
  Filled 2011-03-02: qty 30

## 2011-03-02 MED ORDER — ONDANSETRON HCL 4 MG/2ML IJ SOLN: 4.0000 mg | Freq: Four times a day (QID) | INTRAMUSCULAR | Status: DC | PRN

## 2011-03-02 MED ORDER — MIDAZOLAM HCL 5 MG/5ML IJ SOLN
INTRAMUSCULAR | Status: DC | PRN
  Administered 2011-03-02: 3 mg via INTRAVENOUS

## 2011-03-02 NOTE — ED Notes (Signed)
Pt just released from [facility] on Monday, was in hosp. For ab pain, they were unable to find the cause, tonight woke from sleep around 1am with severe ab apin.  Describes as "feels like trapped gas"

## 2011-03-02 NOTE — ED Notes (Signed)
Floor unable to take report at this time.

## 2011-03-02 NOTE — H&P (Signed)
Subjective:   Patient is a 60 y.o. male presents with **epigastric pain*. This has been intermittent over the past 2 weeks. He has been in the emergency room 4 times between here and [facility] hospital. He was admitted at [facility] and was told that he had gallstones and indigestion. Cardiac workup was negative. He was discharged home on Zantac. He continues to have intermittent epigastric pain which radiates down his abdomen and to his back. Some nausea as noted with fatty foods. No fever, chills, or jaundice have been noted. There are no active problems to display for this patient.  Past Medical History  Diagnosis Date  . High cholesterol     Past Surgical History  Procedure Date  . Neck surgery   . Cardiac catheterization      (Not in a hospital admission) Allergies  Allergen Reactions  . Tylox (Oxycodone-Acetaminophen) Itching    History  Substance Use Topics  . Smoking status: Current Everyday Smoker -- 1.0 packs/day  . Smokeless tobacco: Not on file  . Alcohol Use: No    Family History  Problem Relation Age of Onset  . Cancer Mother   . Cancer Father   . Cancer Sister     Review of Systems A comprehensive review of systems was negative.  Objective:   Patient Vitals for the past 8 hrs:  BP Temp Temp src Pulse Resp SpO2 Height Weight  03/02/11 0449 113/52 mmHg - - - 18  95 % - -  03/02/11 0354 136/74 mmHg - - - 18  95 % - -  03/02/11 0313 - - - - - 98 % - -  03/02/11 0312 110/83 mmHg 97.7 F (36.5 C) Oral 64  24  98 % 5\' 11"  (1.803 m) 82.555 kg (182 lb)          Physical exam: Well-developed and well-nourished white male in no acute distress HEENT: No scleral icterus Neck: Supple without lymphadenopathy Lungs: Clear to auscultation with equal breath sounds bilaterally. Heart: Regular rate and rhythm without S3, S4, murmurs Abdomen: Soft with minimal tenderness in the right upper quadrant to palpation. He points to his epigastric area, though no specific tenderness is  noted. No hepatosplenomegaly, masses, rigidity are noted.  .  Data Review:  Results for orders placed during the hospital encounter of 03/02/11 (from the past 48 hour(s))  CBC     Status: Abnormal   Collection Time   03/02/11  3:37 AM      Component Value Range Comment   WBC 10.6 (*) 4.0 - 10.5 (K/uL)    RBC 4.77  4.22 - 5.81 (MIL/uL)    Hemoglobin 15.7  13.0 - 17.0 (g/dL)    HCT 81.1  91.4 - 78.2 (%)    MCV 93.9  78.0 - 100.0 (fL)    MCH 32.9  26.0 - 34.0 (pg)    MCHC 35.0  30.0 - 36.0 (g/dL)    RDW 95.6  21.3 - 08.6 (%)    Platelets 234  150 - 400 (K/uL)   DIFFERENTIAL     Status: Normal   Collection Time   03/02/11  3:37 AM      Component Value Range Comment   Neutrophils Relative 60  43 - 77 (%)    Neutro Abs 6.3  1.7 - 7.7 (K/uL)    Lymphocytes Relative 28  12 - 46 (%)    Lymphs Abs 3.0  0.7 - 4.0 (K/uL)    Monocytes Relative 8  3 - 12 (%)  Monocytes Absolute 0.8  0.1 - 1.0 (K/uL)    Eosinophils Relative 4  0 - 5 (%)    Eosinophils Absolute 0.5  0.0 - 0.7 (K/uL)    Basophils Relative 0  0 - 1 (%)    Basophils Absolute 0.0  0.0 - 0.1 (K/uL)   COMPREHENSIVE METABOLIC PANEL     Status: Abnormal   Collection Time   03/02/11  3:37 AM      Component Value Range Comment   Sodium 136  135 - 145 (mEq/L)    Potassium 3.9  3.5 - 5.1 (mEq/L)    Chloride 102  96 - 112 (mEq/L)    CO2 24  19 - 32 (mEq/L)    Glucose, Bld 105 (*) 70 - 99 (mg/dL)    BUN 16  6 - 23 (mg/dL)    Creatinine, Ser 8.11  0.50 - 1.35 (mg/dL)    Calcium 91.4  8.4 - 10.5 (mg/dL)    Total Protein 7.5  6.0 - 8.3 (g/dL)    Albumin 3.9  3.5 - 5.2 (g/dL)    AST 14  0 - 37 (U/L)    ALT 25  0 - 53 (U/L)    Alkaline Phosphatase 30 (*) 39 - 117 (U/L)    Total Bilirubin 0.4  0.3 - 1.2 (mg/dL)    GFR calc non Af Amer >60  >60 (mL/min)    GFR calc Af Amer >60  >60 (mL/min)   LIPASE, BLOOD     Status: Abnormal   Collection Time   03/02/11  3:37 AM      Component Value Range Comment   Lipase 66 (*) 11 - 59 (U/L)     LACTIC ACID, PLASMA     Status: Normal   Collection Time   03/02/11  3:38 AM      Component Value Range Comment   Lactic Acid, Venous 1.0  0.5 - 2.2 (mmol/L)      Assessment:   Epigastric pain, possible gastritis versus prior disease.  Plan:   Will admit the patient to the hospital for further evaluation treatment. Will perform an EGD today. Further management is pending those results.

## 2011-03-02 NOTE — ED Provider Notes (Signed)
History     CSN: [id number] Arrival date & time: 03/02/2011  3:05 AM  Chief Complaint  Patient presents with  . Abdominal Pain   HPI Comments: Pt recently admitted to hospital several times for r/o cardiac (cath on 02/18/11 showing no sig dz) and abd pain most recently (d/c 02/28/11) with no obvious diagnosis.  He has [place] and CT showing gall stones and R inguinal hernia containing a loop of small bowel but no obstruction, cholecystitis or pancreatitis.  no other acute findings.  He had labs showing no elevation of liver function tests or blood counts and a lipase which was borderline high he states that the pain tonight came on acutely at 1 AM, is severe, waxes and wanes, feels like "gas pains". He denies nausea or vomiting. He states that this happens when he eats certain foods and admits to eating Mindi Slicker whopper tonight and also eating [employer] sandwich in the last 24 hours.    Patient is a 60 y.o. male presenting with abdominal pain. The history is provided by the patient, a relative and medical records.  Abdominal Pain The primary symptoms of the illness include abdominal pain. The primary symptoms of the illness do not include fever, fatigue, shortness of breath, nausea, vomiting, diarrhea, hematemesis, hematochezia or dysuria. Episode onset: at 1 AM. The onset of the illness was sudden. The problem has not changed since onset. Associated with: Mindi Slicker and [employer] meals in last 24 hours. The patient has not had a change in bowel habit. Symptoms associated with the illness do not include chills, anorexia, diaphoresis, heartburn, urgency, hematuria, frequency or back pain.    Past Medical History  Diagnosis Date  . High cholesterol     Past Surgical History  Procedure Date  . Neck surgery   . Cardiac catheterization     Family History  Problem Relation Age of Onset  . Cancer Mother   . Cancer Father   . Cancer Sister     History  Substance Use Topics  . Smoking status: Current  Everyday Smoker -- 1.0 packs/day  . Smokeless tobacco: Not on file  . Alcohol Use: No      Review of Systems  Constitutional: Negative for fever, chills, diaphoresis and fatigue.  Respiratory: Negative for shortness of breath.   Gastrointestinal: Positive for abdominal pain. Negative for heartburn, nausea, vomiting, diarrhea, hematochezia, anorexia and hematemesis.  Genitourinary: Negative for dysuria, urgency, frequency and hematuria.  Musculoskeletal: Negative for back pain.  All other systems reviewed and are negative.    Physical Exam  BP 136/74  Pulse 64  Temp(Src) 97.7 F (36.5 C) (Oral)  Resp 18  Ht 5\' 11"  (1.803 m)  Wt 182 lb (82.555 kg)  BMI 25.38 kg/m2  SpO2 95%  Physical Exam  Nursing note and vitals reviewed. Constitutional: He appears well-developed and well-nourished.       Uncomfortable appearing  HENT:  Head: Normocephalic and atraumatic.  Mouth/Throat: Oropharynx is clear and moist. No oropharyngeal exudate.  Eyes: Conjunctivae and EOM are normal. Pupils are equal, round, and reactive to light. Right eye exhibits no discharge. Left eye exhibits no discharge. No scleral icterus.  Neck: Normal range of motion. Neck supple. No JVD present. No thyromegaly present.  Cardiovascular: Normal rate, regular rhythm, normal heart sounds and intact distal pulses.  Exam reveals no gallop and no friction rub.   No murmur heard. Pulmonary/Chest: Effort normal and breath sounds normal. No respiratory distress. He has no wheezes. He has no rales.  Abdominal: Soft. Bowel sounds are normal. He exhibits no distension and no mass. There is no tenderness.       Diffuse abdominal pain but no tenderness to palpation. No peritoneal signs and no guarding.  Musculoskeletal: Normal range of motion. He exhibits no edema and no tenderness.  Lymphadenopathy:    He has no cervical adenopathy.  Neurological: He is alert. Coordination normal.  Skin: Skin is warm and dry. No rash noted. No  erythema.  Psychiatric: He has a normal mood and affect. His behavior is normal.    ED Course  Procedures  MDM The patient appears in discomfort, no tachycardia or fevers. Possible biliary colic, peptic ulcer disease. According to medical records patient did test positive for helicobacter. pylori recently. He is taking a proton pump inhibitor and has some improvement with Vicodin. We'll begin workup with lactic acid in addition to CBC encumbrance of metabolic panel. GI cocktail given at this time.    ED ECG REPORT   Date: 03/02/2011 03:41 AM  Rate: 55   Rhythm: sinus bradycardia  QRS Axis: normal  Intervals: normal  ST/T Wave abnormalities: normal  Conduction Disutrbances:none  Narrative Interpretation: unchanged, normal sinus brady  Old EKG Reviewed: unchanged 02/27/11  Results for orders placed during the hospital encounter of 03/02/11  CBC      Component Value Range   WBC 10.6 (*) 4.0 - 10.5 (K/uL)   RBC 4.77  4.22 - 5.81 (MIL/uL)   Hemoglobin 15.7  13.0 - 17.0 (g/dL)   HCT 40.9  81.1 - 91.4 (%)   MCV 93.9  78.0 - 100.0 (fL)   MCH 32.9  26.0 - 34.0 (pg)   MCHC 35.0  30.0 - 36.0 (g/dL)   RDW 78.2  95.6 - 21.3 (%)   Platelets 234  150 - 400 (K/uL)  DIFFERENTIAL      Component Value Range   Neutrophils Relative 60  43 - 77 (%)   Neutro Abs 6.3  1.7 - 7.7 (K/uL)   Lymphocytes Relative 28  12 - 46 (%)   Lymphs Abs 3.0  0.7 - 4.0 (K/uL)   Monocytes Relative 8  3 - 12 (%)   Monocytes Absolute 0.8  0.1 - 1.0 (K/uL)   Eosinophils Relative 4  0 - 5 (%)   Eosinophils Absolute 0.5  0.0 - 0.7 (K/uL)   Basophils Relative 0  0 - 1 (%)   Basophils Absolute 0.0  0.0 - 0.1 (K/uL)  COMPREHENSIVE METABOLIC PANEL      Component Value Range   Sodium 136  135 - 145 (mEq/L)   Potassium 3.9  3.5 - 5.1 (mEq/L)   Chloride 102  96 - 112 (mEq/L)   CO2 24  19 - 32 (mEq/L)   Glucose, Bld 105 (*) 70 - 99 (mg/dL)   BUN 16  6 - 23 (mg/dL)   Creatinine, Ser 0.86  0.50 - 1.35 (mg/dL)   Calcium  57.8  8.4 - 10.5 (mg/dL)   Total Protein 7.5  6.0 - 8.3 (g/dL)   Albumin 3.9  3.5 - 5.2 (g/dL)   AST 14  0 - 37 (U/L)   ALT 25  0 - 53 (U/L)   Alkaline Phosphatase 30 (*) 39 - 117 (U/L)   Total Bilirubin 0.4  0.3 - 1.2 (mg/dL)   GFR calc non Af Amer >60  >60 (mL/min)   GFR calc Af Amer >60  >60 (mL/min)  LIPASE, BLOOD      Component Value Range  Lipase 66 (*) 11 - 59 (U/L)    US Abdomen Complete  02/26/2011  *RADIOLOGY REPORT*  Clinical Data:  Abdominal pain.  ABDOMINAL ULTRASOUND COMPLETE  Comparison:  CT abdomen pelvis 03/01/2005 and abdominal ultrasound 02/22/2011  Findings:  Gallbladder:  There are multiple stones in the gallbladder, witha single stone measuring up to 1.0 cm. Gallbladder wall thickness is within normal limits, 4 mm. No pericholecystic fluid .The sonographic Murphy's sign is negative.  Common Bile Duct:  Within normal limits in caliber. Measures 5 mm.  Liver: No focal mass lesion identified.  Within normal limits in parenchymal echogenicity.  IVC:  Appears normal.  Pancreas:  Although the pancreas is difficult to visualize in its entirety, no focal pancreatic abnormality is identified.  Spleen:  Within normal limits in size and echotexture.  Right kidney:  Normal in size and parenchymal echogenicity. Lateral 1.0 cm simple cyst noted. No evidence of  hydronephrosis.  Left kidney:  Normal in size and parenchymal echogenicity.  1.4 cm simple cyst in the upper pole.No evidence of hydronephrosis.  Abdominal Aorta:  No aneurysm identified.  IMPRESSION:  1.  Cholelithiasis. 2.  No sonographic evidence of cholecystitis. 3.  Bilateral simple renal cysts, one in each kidney.  Original Report Authenticated By: Britta Mccreedy, M.D.   Ct Abdomen Pelvis W Contrast  02/26/2011  *RADIOLOGY REPORT*  Clinical Data: Abdominal pain.  CT ABDOMEN AND PELVIS WITH CONTRAST  Technique:  Multidetector CT imaging of the abdomen and pelvis was performed following the standard protocol during bolus  administration of intravenous contrast.  Contrast: 80 ml Omnipaque-300  Comparison: CT abdomen pelvis 03/01/2005 and abdominal ultrasound 02/26/2011  Findings: Lung bases are clear.  Heart size is normal.  Two circumscribed low density lesions in the left lobe of the liver are present, one which was included on the CT of 2006 and can be considered benign.  The second low density lesion more superiorly measures 4 mm and is most likely a benign lesion such as hepatic cyst.  There is no biliary ductal dilatation.  There are multiple stones within the gallbladder.  No evidence of gallbladder wall thickening or pericholecystic fluid.  The there are a few small bilateral renal cyst.  No suspicious renal mass or hydronephrosis.  No visible renal calculi on contrast imaging.  The spleen, pancreas, adrenal glands are within normal limits.  Normal appendix.  Bowel loops are normal in caliber.  A few scattered diverticula of the colon, uncomplicated.  A right inguinal hernia contains a loop of small bowel.  There is no evidence of strangulation or obstruction.  The fat within the hernia sac appears normal, no evidence of fluid or inflammatory change.  There is mild prostatomegaly.  Urinary bladder is unremarkable.  The left inguinal hernia contains fat only.  There is scattered atherosclerotic disease of the abdominal aorta and iliac vasculature, without aneurysm.  Normal height alignment of vertebral bodies.  No acute bony abnormality.  IMPRESSION:  1.  Right inguinal hernia contains a loop of nonobstructed small bowel. 2.  Cholelithiasis. 3.  Abdominal aorta atherosclerosis, without aneurysm. 4.  One definite hepatic cyst, given its stability over time.  One probable tiny cyst in the liver.  5.  Small bilateral renal cysts.  Original Report Authenticated By: Britta Mccreedy, M.D.     Pt reeavluated and has no ttp and has 1/10 pain after 1mg  of dilaudid.  I have discussed the case with Dr. Lovell Sheehan of general surgery who  will see the patient in  the ER. Requests that we keep the patient comfortable until his arrival.  Vida Roller, MD 03/06/11 [phone]

## 2011-03-02 NOTE — ED Notes (Signed)
Pt was having blood drawn and per phlebotomist, pt "passed out"   X 2 when he saw the blood.  Pt awakened with sternal rub.   Pt then writhing around on the bed and again moaning out in pain.    Dr Hyacinth Meeker made aware of same and new pain med order received

## 2011-03-02 NOTE — ED Notes (Signed)
Pain now at 1.

## 2011-03-02 NOTE — ED Notes (Signed)
Dr Lovell Sheehan in to see pt and will write orders for admission

## 2011-03-02 NOTE — ED Notes (Signed)
Pt feeling better. Pt is resting.

## 2011-03-03 LAB — BASIC METABOLIC PANEL
Chloride: 103 mEq/L (ref 96–112)
Creatinine, Ser: 1.18 mg/dL (ref 0.50–1.35)
GFR calc Af Amer: >60 mL/min (ref >60)
Potassium: 3.8 mEq/L (ref 3.5–5.1)

## 2011-03-03 LAB — AMYLASE: Amylase: 95 U/L (ref 0–105)

## 2011-03-03 LAB — CBC
MCV: 96 fL (ref 78.0–100.0)
Platelets: 189 10*3/uL (ref 150–400)
RDW: 11.9 % (ref 11.5–15.5)
WBC: 8.6 10*3/uL (ref 4.0–10.5)

## 2011-03-03 LAB — CLOTEST (H. PYLORI), BIOPSY: Helicobacter screen: NEGATIVE

## 2011-03-03 MED ORDER — HYDROCODONE-ACETAMINOPHEN 5-325 MG PO TABS: 2.0000 | ORAL_TABLET | ORAL | Status: AC | PRN

## 2011-03-03 MED ORDER — CHOLESTYRAMINE 4 G PO PACK: 1.0000 | PACK | Freq: Three times a day (TID) | ORAL | Status: DC

## 2011-03-03 MED ORDER — HYDROCODONE-ACETAMINOPHEN 5-325 MG PO TABS: 2.0000 | ORAL_TABLET | Freq: Four times a day (QID) | ORAL | Status: DC | PRN

## 2011-03-03 NOTE — Discharge Summary (Signed)
Physician Discharge Summary  Patient ID: NORLAN RANN MRN: [id number] DOB/AGE: 01-Nov-1950 60 y.o.  Admit date: 03/02/2011 Discharge date: 03/03/2011  Admission Diagnoses: Epigastric pain  Discharge Diagnoses: Gastritis, cholelithiasis Active Problems:  * No active hospital problems. *    Discharged Condition: good  Hospital Course: Patient is a 60 year old white male with a known history of cholelithiasis who presented emergency room with worsening epigastric pain. He was noted to the hospital for further evaluation treatment. An EGD with biopsy was done that day which revealed gastritis. H. pylori results are still pending. He was started on Questran to 2 bile acid reflux. His pain has significantly improved. He is being discharged home in good and improving condition.  Consults: none  Significant Diagnostic Studies: endoscopy: gastroscopy: Gastritis, bile acid reflux  Treatments: IV hydration and analgesia: acetaminophen w/ codeine  Discharge Exam: Blood pressure 103/64, pulse 48, temperature 98.2 F (36.8 C), temperature source Oral, resp. rate 20, height 5\' 11"  (1.803 m), weight 84.1 kg (185 lb 6.5 oz), SpO2 98.00%. General appearance: alert, cooperative and no distress Back: negative, symmetric, no curvature. ROM normal. No CVA tenderness. Cardio: regular rate and rhythm, S1, S2 normal, no murmur, click, rub or gallop GI: soft, non-tender; bowel sounds normal; no masses,  no organomegaly As: Clear to auscultation with equal breath sounds bilaterally  Disposition: Home  Current Discharge Medication List    START taking these medications   Details  cholestyramine (QUESTRAN) 4 G packet Take 1 packet by mouth 3 (three) times daily with meals. Qty: 60 each, Refills: 3    !! HYDROcodone-acetaminophen (NORCO) 5-325 MG per tablet Take 2 tablets by mouth every 6 (six) hours as needed for pain. Qty: 40 tablet, Refills: 0     !! - Potential duplicate medications found. Please  discuss with provider.    CONTINUE these medications which have CHANGED   Details  !! HYDROcodone-acetaminophen (NORCO) 5-325 MG per tablet Take 2 tablets by mouth every 4 (four) hours as needed for pain. Qty: 10 tablet, Refills: 0     !! - Potential duplicate medications found. Please discuss with provider.    CONTINUE these medications which have NOT CHANGED   Details  acetaminophen (TYLENOL) 500 MG tablet Take 1,000 mg by mouth every 6 (six) hours as needed. For pain     atorvastatin (LIPITOR) 10 MG tablet Take 10 mg by mouth daily.      nicotine (NICODERM CQ - DOSED IN MG/24 HOURS) 21 mg/24hr patch Place 1 patch onto the skin daily.      pantoprazole (PROTONIX) 40 MG tablet Take 40 mg by mouth 2 (two) times daily.      promethazine (PHENERGAN) 12.5 MG tablet Take 12.5 mg by mouth every 6 (six) hours as needed.       STOP taking these medications     Aspirin-Salicylamide-Caffeine (BC HEADACHE POWDER PO)      ranitidine (ZANTAC) 150 MG tablet        Follow-up Information    Follow up with Lorraine Cimmino A. Make an appointment on 03/10/2011.   Contact information:   [address] [place] [address] [phone]          Signed: Dalia Heading 03/03/2011, 2:15 PM

## 2011-03-07 NOTE — H&P (Signed)
NAMEMarland Kitchen  Gerald, Salas NO.:  [id number]  MEDICAL RECORD NO.:  [id number]  LOCATION:  3740                         FACILITY:  [facility]  PHYSICIAN:  Vesta Mixer, M.D. DATE OF BIRTH:  02/25/1951  DATE OF ADMISSION:  02/17/2011 DATE OF DISCHARGE:                             HISTORY & PHYSICAL   PRIMARY CARE PHYSICIAN:  Dr. Perrin Maltese at [facility].  PRIMARY CARDIOLOGIST:  None.  CHIEF COMPLAINT:  Chest pain.  HISTORY OF PRESENT ILLNESS:  Gerald Salas is a 60 year old male with no previous history of coronary artery disease.  He was in his usual state of health recently except for some fatigue.  He has been sleeping a lot more than usual.  Today, he woke with substernal chest pain at 5/10.  He describes it as a dull pain and has a pressure.  He was able to eat.  He has some shortness of breath with it but no nausea, vomiting or diaphoresis.  When his symptoms did not resolve, he tried antacids which gave no relief.  His symptoms started at about 6:30.  His wife made him go to the emergency room when he went to pick her up at lunch.  He got to the emergency room approximately 1 p.m.  He received sublingual nitroglycerin at 1:30.  His symptoms resolved.  He has never had pain like this before.  He has been able to walk up hills and mow the yard without getting chest pain.  He has chronic dyspnea on exertion that is not changed recently.  He has had substernal chest pain similar to this before after eating certain foods, especially hotdogs.  These symptoms have always resolved promptly with antacids.  Of note, he felt that there was no change with deep inspiration or movement in the pain he had today.  Currently, he is resting comfortably.  PAST MEDICAL HISTORY: 1. History of chest pain in 2001, with cardiac catheterization showed     no evidence of flow limiting coronary artery disease and normal     left ventricular systolic function. 2. Ongoing tobacco  use. 3. Hyperlipidemia. 4. COPD.  SURGICAL HISTORY:  He is status post neck surgery after an injury in 1994, tonsillectomy and hand surgery.  ALLERGIES:  TYLOX.  MEDICATIONS:  He takes no prescription drugs but takes Tylenol and take BC powder 7-8 times a month for headaches.  SOCIAL HISTORY:  He lives in [place] with his wife and daughter.  He is disabled secondary to the broken neck.  He has greater than 40 pack-year history of ongoing tobacco use but denies alcohol or drug abuse.  FAMILY HISTORY:  His mother died at 49 of cancer and his father died at 35 with cancer but had a history of cardiac enlargement.  No siblings have coronary artery disease.  REVIEW OF SYSTEMS:  He has chronic dyspnea on exertion.  He coughs and wheezes.  He has episodic numbness in both of his arms as a result of his neck surgery.  He gets reflux symptoms which he treats with antacids but has never had melena.  He has been told that he has arthritis in his neck and has some  arthralgias.  Full 14-point review of systems is otherwise negative except as stated in the HPI.  PHYSICAL EXAMINATION:  VITAL SIGNS:  Temperature is 97.9, blood pressure 122/72, heart rate 63, respiratory rate 18, O2 saturation 99% on room air. GENERAL:  He is a well-developed, well-nourished white male in no acute distress. HEENT:  Normal. NECK:  There is no lymphadenopathy, thyromegaly, bruit or JVD noted. CV:  Heart is regular in rate and rhythm with an S1-S2 and no significant murmur, rub or gallop was noted.  Distal pulses are intact in all four extremities. LUNGS:  He has a few rales in the bases, left greater than right with good air exchange. SKIN:  No rashes or lesions are noted. ABDOMEN:  Soft and nontender with active bowel sounds. EXTREMITIES:  There is no cyanosis, clubbing or edema noted. MUSCULOSKELETAL:  There is no joint deformity or effusions and no spine or CVA tenderness. NEURO:  He is alert and oriented.   Cranial nerves II-XII grossly intact.  Chest x-ray no acute disease.  EKG sinus rhythm rate 69 with no acute ischemic changes.  LABORATORY VALUES:  Hemoglobin 15.3, hematocrit 44.4, WBCs 9.1, platelets 194.  Sodium 143, potassium 4.1, chloride 107, CO2 28, BUN 16, creatinine 1.01, glucose 108, CK-MB and troponin I negative x1.  IMPRESSION:  Gerald Salas was seen today by Dr. Elease Hashimoto, the patient evaluated and the data reviewed.  He is a 60 year old male with a history of smoking.  He woke with dull substernal chest pain that was relieved with nitroglycerin.  It was an indigestion - like pain.  It lasted several hours.  There is no acute abnormalities on physical exam.  PLAN: 1. Cycle cardiac enzymes. 2. Cardiac catheterization versus Myoview was discussed with the     patient and his wife.  The risks and benefits were reviewed and     cardiac catheterization is planned for a.m.     Theodore Demark, PA-C   ______________________________ Vesta Mixer, M.D.    RB/MEDQ  D:  02/17/2011  T:  02/18/2011  Job:  [id number]  Electronically Signed by Theodore Demark PA-C on 03/01/2011 02:10:11 PM Electronically Signed by Kristeen Miss M.D. on 03/07/2011 07:43:43 PM

## 2011-03-08 ENCOUNTER — Encounter (HOSPITAL_COMMUNITY): Payer: Self-pay | Admitting: General Surgery

## 2011-03-08 ENCOUNTER — Encounter: Payer: Self-pay | Admitting: *Deleted

## 2011-03-18 NOTE — Discharge Summary (Signed)
  NAMEMarland Kitchen  NAREG, BREIGHNER NO.:  [id number]  MEDICAL RECORD NO.:  [id number]  LOCATION:  3740                         FACILITY:  [facility]  PHYSICIAN:  Luis Abed, MD, FACCDATE OF BIRTH:  06-15-1951  DATE OF ADMISSION:  02/17/2011 DATE OF DISCHARGE:  02/18/2011                              DISCHARGE SUMMARY   PRIMARY CARDIOLOGIST:  Vesta Mixer, MD  PRIMARY CARE PROVIDER:  Jonita Albee, MD, [facility]  DISCHARGE DIAGNOSIS:  Chest pain without objective evidence of ischemia.  SECONDARY DIAGNOSES: 1. Nonobstructive coronary artery disease by catheterization this     admission. 2. Ongoing tobacco abuse. 3. Hyperlipidemia. 4. Chronic obstructive pulmonary disease. 5. Possible hyperthyroidism requiring additional outpatient     evaluation.  ALLERGIES:  TYLOX.  PROCEDURES:  Left heart cardiac catheterization performed on February 18, 2011, revealing a 30% proximal stenosis in the LAD and otherwise normal coronary arteries.  EF was 60%.  HISTORY OF PRESENT ILLNESS:  This is a 60 year old male without prior cardiac history who was in his usual state of health until day of admission when he woke with 5/10 chest discomfort which was unrelieved with antacids.  The patient presented to the ED at [facility] where symptoms resolved with nitroglycerin.  ECG was nonacute.  He was transferred to the [facility] for further evaluation and cardiology admission.  HOSPITAL COURSE:  The patient ruled out for MI.  Decision was made to pursue diagnostic catheterization which showed nonobstructive CAD as outlined above.  We have added low-dose statin as well as aspirin therapy and the patient has also been counseled on the importance of smoking cessation.  It has been noted on laboratory evaluation that the patient's TSH is low at 0.014.  I recommended followup with outpatient primary care within the next week for additional thyroid  function testing.  DISCHARGE LABORATORY DATA:  Hemoglobin 14.6, hematocrit 42.4, WBC 6.9, and platelets 183.  INR 0.97.  Sodium 143, potassium 4.1, chloride 107, CO2 of 28, BUN 65, creatinine 1.01, and glucose 108.  CK 57, MB 1.9, and troponin I less than 0.20.  Total cholesterol 150, triglycerides 156, HDL 33, and LDL 86.  TSH 0.014.  DISPOSITION:  The patient is being discharged to home today in good condition.  FOLLOWUP PLANS AND APPOINTMENTS:  The patient will follow up with his primary care provider within the next week for additional thyroid evaluation.  DISCHARGE MEDICATIONS: 1. Aspirin 81 mg daily. 2. Lipitor 10 mg nightly.  OUTSTANDING LABORATORY DATA AND STUDIES:  None.  DURATION OF DISCHARGE ENCOUNTER:  45 minutes including physician time.     Nicolasa Ducking, ANP   ______________________________ Luis Abed, MD, [facility]    CB/MEDQ  D:  02/18/2011  T:  02/19/2011  Job:  [id number]  cc:   Jonita Albee, M.D.  Electronically Signed by Nicolasa Ducking ANP on 03/10/2011 03:15:50 PM Electronically Signed by Willa Rough MD FACC on 03/18/2011 09:00:12 AM

## 2011-04-12 NOTE — H&P (Signed)
Gerald Salas, Gerald Salas NO.:  [id number]  MEDICAL RECORD NO.:  [id number]  LOCATION:  [facility]                         FACILITY:  [facility]  PHYSICIAN:  Pearlean Brownie, M.D.DATE OF BIRTH:  28-Mar-1951  DATE OF ADMISSION:  02/26/2011 DATE OF DISCHARGE:                             HISTORY & PHYSICAL   CHIEF COMPLAINT:  Abdominal pain.  HISTORY OF PRESENT ILLNESS:  Gerald Salas has had some intermittent abdominal pain for about Gerald past week.  He was found to have gallstones during his recent admission for cardiac rule out.  However, this morning, Gerald Salas became acutely worse.  He reports Gerald pain is 10/10, it is sharp pain, but it is intermittent.  He has had nausea, but no vomiting.  He has no appetite.  Denies constipation.  Denies diarrhea.  Denies blood in his stool.  Denies dysuria.  Denies chest pain.  Denies dizziness.  Denies dyspnea.  Gerald Salas and his wife felt that Gerald pain was concerning and severe and so brought him to Gerald emergency department for evaluation.  PAST MEDICAL HISTORY:  Significant for a recent admission for chest pain.  A percutaneous cath was clean.  No cardiac disease.  Gerald Salas is a smoker.  He has greater than 100 pack year history and Gerald Salas has disability for neck pain.  FAMILY HISTORY:  Noncontributory.  SOCIAL HISTORY:  Again, Gerald Salas is a smoker.  He denies alcohol or drug use.  DRUG ALLERGIES:  He is allergic to CODEINE.  MEDICATIONS:  No prescription medications at home and he takes over-Gerald- counter Tylenol and NSAIDs.  PHYSICAL EXAMINATION:  VITAL SIGNS:  Temperature 97.5, pulse 54, respiratory rate 20, blood pressure 146/97 and pulse ox is 100% on room air. GENERAL:  Gerald Salas appears uncomfortable. HEAD, EYES, EARS, NOSE AND THROAT:  He has poor dentition, but mucous membranes are moist.  Extraocular movements intact.  Pupils equal, round and reactive to light.  NECK:  Supple.  Trachea midline.  No  JVD. CHEST:  Nontender. HEART:  Regular rate and rhythm.  No murmurs, rubs or gallops. ABDOMEN:  Soft with diffuse, nonfocal tenderness.  No rebound.  He does have a reducible inguinal hernia.  There is no organomegaly or palpable masses.  Gerald Salas has normal bowel sounds. EXTREMITIES:  Warm and well-perfused with 2+ pulses. NEUROLOGIC:  Gerald Salas has no new focal deficits.  IMAGING:  Abdominal ultrasound shows cholelithiasis without cholecystitis.  CT of Gerald abdomen and pelvis shows right inguinal hernia and nonobstructed shows cholelithiasis, again no cholecystitis.  Gerald Salas has no aortic aneurysm and stable hepatic cyst.  LABORATORY DATA:  CBC with differential white blood cell count 15.5, hemoglobin 15.9, hematocrit 44.0, platelets 203, neutrophils 79% lymphocytes 12%, eosinophils 2%.  Complete metabolic panel; sodium 142, potassium 4.1, chloride 106, bicarb 26, BUN 13, creatinine 1.09, glucose 97, total bilirubin 0.3, alk phos 26, calcium 9.9, AST 18, ALT 35, total protein is 7.3, albumin is 3.9, lipase 40, troponin I 0.00.  Urinalysis was significant for an amber color cloudy with small bilirubin, 15 ketones and small blood.  Gerald urine microscopic showed rare epithelial cells, 0-2 white blood cells and 7-10  red blood cells.  ASSESSMENT AND PLAN:  This is a 60 year old male with recent workup for chest pain, negative cardiac cath who presents with severe intractable abdominal pain. 1. Abdominal pain.  No clear etiology at this time, but Gerald Salas is     requiring Dilaudid for pain control.  Therefore, requires     admission.  We will admit him for observation at this time.  We     will give Protonix for concern for gastritis.  We will monitor her     liver function tests and lipase.  For other causes of pain,     consider a HIDA scan for imaging. 2. Pain.  Again, we will control pain with IV Dilaudid. 3. Elevated white blood cell count is concerning for infection  or     inflammation, but no clear cause at this time.  Gerald Salas does     have some changes in his urine, but is not classic for a urinary     tract infection.  We will send Gerald urine for culture to rule out     infection, but there is no indication to treat empirically at this     time. 4. Fluids, Electrolytes, and Nutrition/Gastroenterology.  We will make     Gerald Salas n.p.o.  We will give maintenance IV fluids.  We will     treat for gastritis with Protonix.  We will hemoccult his stools. 5. Prophylaxis.  We will give heparin 5000 units subcu t.i.d. 6. Disposition is pending clinical improvement.    ______________________________ Ardyth Gal, MD   ______________________________ Pearlean Brownie, M.D.    CR/MEDQ  D:  02/26/2011  T:  02/26/2011  Job:  [id number]  Electronically Signed by Ardyth Gal MD on 03/23/2011 10:07:40 AM Electronically Signed by Pearlean Brownie M.D. on 04/12/2011 91:47:82 PM

## 2011-05-11 NOTE — Discharge Summary (Signed)
NAMEAVYAY, COGER NO.:  [id number]  MEDICAL RECORD NO.:  [id number]  LOCATION:  [facility]                         FACILITY:  [facility]  PHYSICIAN:  Nestor Ramp, MD        DATE OF BIRTH:  Jun 19, 1951  DATE OF ADMISSION:  02/26/2011 DATE OF DISCHARGE:  02/28/2011                              DISCHARGE SUMMARY   DISCHARGE DIAGNOSES: 1. Abdominal pain. 2. Hyperlipidemia. 3. Chronic obstructive pulmonary disease. 4. History of tobacco abuse. 5. Questionable hypothyroidism. 6. Nonobstructive coronary artery disease by catheterization, February 18, 2011.  PROCEDURES: 1. Abdominal ultrasound, February 26, 2011, showing cholelithiasis.     No sonographic evidence for cholecystitis except bilateral simple     renal cysts. 2. CT of the abdomen and pelvis with contrast, February 26, 2011,     showing right inguinal hernia containing a loop of nonobstructive     small-bowel. 3. Cholelithiasis. 4. Abdominal aortic atherosclerosis with no aneurysm. 5. One definite hepatic cyst. 6. Small bilateral renal cysts  LABORATORY DATA AND STUDIES: 1. CBC, February 27, 2011, with a white count 13.3, hemoglobin 13.8,     hematocrit 40.1, platelet count of 187. 2. CMET, February 27, 2011, showing sodium 134, potassium 2.9,     chloride 100, CO2 of 25, BUN 11, creatinine 1.01, t. bili 0.6, alk     phos 25, AST 15, ALT 31, total protein 6.2, blood albumin 8.9. 3. Lipase.  Serial lipases that trended from 40 on February 26, 2011,     to 22 on February 26, 2011, and 69 on February 28, 2011. 4. Urine culture showing 20,000 of lactobacillus. 5. CBC, February 28, 2011, showing CBC with a white count 9.6,     hemoglobin 14.1, hematocrit 40.3, platelet count of 186.  BRIEF HOSPITAL COURSE:  Briefly, this is a 60 year old male with a past medical history of obesity, hyperlipidemia, COPD, tobacco abuse, as well as a recent admission for noncardiac chest pain with a nonobstructive coronary  cath who presented with persistent abdominal pain x2 days. 1. Abdominal pain.  The patient was initially admitted with relatively     broad differential diagnosis for his presentation including     gallbladder disease, gastritis/peptic ulcer disease versus     pancreatitis.  The patient was initially made n.p.o., placed on IV     fluids on admission.  The patient reports subsequent marked     resolution of abdominal pain on the day of discharge.  Of note, the     patient was also placed on high-dose PPIs on admission.  Currently     the working diagnosis for the patient still remains relatively     broad in the setting of his imaging studies, which included     abdominal ultrasound and CT.  Both of these studies were indicative     of cholelithiasis; however, there was no acute evidence of     cholecystitis.  There may be a component of biliary colic to     overall presentation as stated before relatively broad differential     diagnosis, which includes gastritis in the setting of smoking and  obesity as well as biliary colic versus pancreatitis was noted of     trending pancreatic enzymes, noted mild bump in pancreatic enzymes     on admission.  It is recommended that the patient follow up with a     gastroenterologist in the outpatient setting.  At the time of     discharge, the patient is strongly recommended to avoid any high-     fat foods.  The patient did tolerate a low-fat lunch prior to     discharge with no abdominal pain reported with normal bowel     movements and no reported nausea.  The patient is instructed to     continue on high-dose PPI proton pump inhibitor at discharge with     planned followup with Dr. Perrin Maltese in the next 1 to 2 weeks with     recommendation for outpatient GI referral in the outpatient     setting. 2. Pain.  Given the patient's pain on presentation there was a concern     for pancreatitis.  This was negative on a CT scan; however, his     pancreatic  enzymes were elevated on presentation, but however,     trended down prior to discharge.  The patient did report eating a     relatively high-fat meal prior to the onset of the symptoms with     the patient reporting eating several pieces of fried fish the night     prior to his admission.  FOLLOWUP:  The patient is instructed to follow up with his primary care provider, Dr. Perrin Maltese at [facility].  ISSUES TO FOLLOWUP: 1. It is recommended that the patient have an outpatient again     Gastroenterology referral for evaluation of this recurrent     abdominal pain.  It is noted that the patient has also had a recent     admission for chest pain, which was of a noncardiac source based on     a nonobstructive coronary catheterization given this may be related     to GI source of his symptoms. 2. There is a recommendation for the patient to have a followup on a     moderately low TSH at his most recent hospitalization February 18, 2011, however, this will need to be followed up in the outpatient     setting.     Doree Albee, MD   ______________________________ Nestor Ramp, MD  SN/MEDQ  D:  02/28/2011  T:  02/28/2011  Job:  [id number]  cc:   Jonita Albee, M.D.  Electronically Signed by Doree Albee  on 04/29/2011 09:04:42 AM Electronically Signed by Denny Levy MD on 05/11/2011 08:00:24 AM

## 2011-07-29 ENCOUNTER — Ambulatory Visit (INDEPENDENT_AMBULATORY_CARE_PROVIDER_SITE_OTHER): Payer: Medicare Other | Admitting: Internal Medicine

## 2011-07-29 VITALS — BP 111/63 | HR 67 | Temp 98.6°F | Resp 16 | Ht 70.0 in | Wt 177.0 lb

## 2011-07-29 DIAGNOSIS — Z719 Counseling, unspecified: Secondary | ICD-10-CM

## 2011-07-29 DIAGNOSIS — K219 Gastro-esophageal reflux disease without esophagitis: Secondary | ICD-10-CM

## 2011-07-29 MED ORDER — SUCRALFATE 1 GM/10ML PO SUSP: 1.0000 g | Freq: Four times a day (QID) | ORAL | Status: AC

## 2011-07-29 NOTE — Progress Notes (Signed)
  Subjective:    Patient ID: Gerald Salas, male    DOB: 01-Nov-1950, 61 y.o.   MRN: [id number]  HPI Severe GERD   Review of Systems    NO wt loss, anorexia, fatigue Objective:   Physical Exam Minimal abd. Tenderness.       Assessment & Plan:  Progressive GERD Refer to GI Carafate slurry GERD HO

## 2011-09-21 ENCOUNTER — Encounter: Payer: Self-pay | Admitting: Gastroenterology

## 2011-09-27 ENCOUNTER — Emergency Department (HOSPITAL_COMMUNITY): Payer: Medicare Other

## 2011-09-27 ENCOUNTER — Encounter (HOSPITAL_COMMUNITY): Payer: Self-pay | Admitting: *Deleted

## 2011-09-27 ENCOUNTER — Emergency Department (HOSPITAL_COMMUNITY)
Admission: EM | Admit: 2011-09-27 | Discharge: 2011-09-27 | Disposition: A | Discharge status: Home / Self Care | Payer: Medicare Other | Source: Home / Self Care | Attending: Emergency Medicine | Admitting: Emergency Medicine

## 2011-09-27 ENCOUNTER — Inpatient Hospital Stay (HOSPITAL_COMMUNITY)
Admission: EM | Admit: 2011-09-27 | Discharge: 2011-09-29 | DRG: 419 | Disposition: A | Discharge status: Home / Self Care | Payer: Medicare Other | Source: Ambulatory Visit | Attending: Surgery | Admitting: Surgery

## 2011-09-27 DIAGNOSIS — J449 Chronic obstructive pulmonary disease, unspecified: Secondary | ICD-10-CM | POA: Diagnosis present

## 2011-09-27 DIAGNOSIS — K219 Gastro-esophageal reflux disease without esophagitis: Secondary | ICD-10-CM | POA: Diagnosis present

## 2011-09-27 DIAGNOSIS — Z23 Encounter for immunization: Secondary | ICD-10-CM

## 2011-09-27 DIAGNOSIS — K8010 Calculus of gallbladder with chronic cholecystitis without obstruction: Secondary | ICD-10-CM

## 2011-09-27 DIAGNOSIS — J4489 Other specified chronic obstructive pulmonary disease: Secondary | ICD-10-CM | POA: Diagnosis present

## 2011-09-27 DIAGNOSIS — K8000 Calculus of gallbladder with acute cholecystitis without obstruction: Principal | ICD-10-CM | POA: Diagnosis present

## 2011-09-27 HISTORY — DX: Headache: R51

## 2011-09-27 HISTORY — DX: Angina pectoris, unspecified: I20.9

## 2011-09-27 HISTORY — DX: Unspecified osteoarthritis, unspecified site: M19.90

## 2011-09-27 HISTORY — DX: Gastro-esophageal reflux disease without esophagitis: K21.9

## 2011-09-27 LAB — COMPREHENSIVE METABOLIC PANEL
ALT: 40 U/L (ref 0–53)
AST: 19 U/L (ref 0–37)
Albumin: 3.7 g/dL (ref 3.5–5.2)
Alkaline Phosphatase: 30 U/L — ABNORMAL LOW (ref 39–117)
Glucose, Bld: 117 mg/dL — ABNORMAL HIGH (ref 70–99)
Potassium: 3.9 mEq/L (ref 3.5–5.1)
Sodium: 138 mEq/L (ref 135–145)
Total Protein: 7.3 g/dL (ref 6.0–8.3)

## 2011-09-27 LAB — CBC
HCT: 42.5 % (ref 39.0–52.0)
Hemoglobin: 14.6 g/dL (ref 13.0–17.0)
MCH: 31.8 pg (ref 26.0–34.0)
MCH: 32.2 pg (ref 26.0–34.0)
MCHC: 34.4 g/dL (ref 30.0–36.0)
Platelets: 201 10*3/uL (ref 150–400)
RBC: 4.9 MIL/uL (ref 4.22–5.81)

## 2011-09-27 LAB — DIFFERENTIAL
Basophils Absolute: 0 10*3/uL (ref 0.0–0.1)
Basophils Relative: 0 % (ref 0–1)
Basophils Relative: 0 % (ref 0–1)
Eosinophils Absolute: 0.5 10*3/uL (ref 0.0–0.7)
Eosinophils Absolute: 0.5 10*3/uL (ref 0.0–0.7)
Lymphs Abs: 2.8 10*3/uL (ref 0.7–4.0)
Lymphs Abs: 3.3 10*3/uL (ref 0.7–4.0)
Monocytes Absolute: 1.2 10*3/uL — ABNORMAL HIGH (ref 0.1–1.0)
Monocytes Relative: 9 % (ref 3–12)
Neutrophils Relative %: 65 % (ref 43–77)

## 2011-09-27 MED ORDER — TRAMADOL HCL 50 MG PO TABS: 50.0000 mg | ORAL_TABLET | Freq: Four times a day (QID) | ORAL | Status: DC | PRN

## 2011-09-27 MED ORDER — SODIUM CHLORIDE 0.9 % IV SOLN
INTRAVENOUS | Status: DC
  Administered 2011-09-27: 05:00:00 via INTRAVENOUS

## 2011-09-27 MED ORDER — ONDANSETRON HCL 4 MG/2ML IJ SOLN
4.0000 mg | Freq: Once | INTRAMUSCULAR | Status: AC
  Administered 2011-09-27: 4 mg via INTRAVENOUS
  Filled 2011-09-27: qty 2

## 2011-09-27 MED ORDER — ONDANSETRON HCL 8 MG PO TABS: 8.0000 mg | ORAL_TABLET | ORAL | Status: DC | PRN

## 2011-09-27 MED ORDER — SODIUM CHLORIDE 0.9 % IV BOLUS (SEPSIS)
1000.0000 mL | Freq: Once | INTRAVENOUS | Status: AC
  Administered 2011-09-27: 1000 mL via INTRAVENOUS

## 2011-09-27 MED ORDER — HYDROMORPHONE HCL PF 1 MG/ML IJ SOLN
1.0000 mg | Freq: Once | INTRAMUSCULAR | Status: AC
  Administered 2011-09-27: 1 mg via INTRAVENOUS
  Filled 2011-09-27: qty 1

## 2011-09-27 MED ORDER — HYDROMORPHONE HCL PF 1 MG/ML IJ SOLN
1.0000 mg | Freq: Once | INTRAMUSCULAR | Status: AC
  Administered 2011-09-27: 1 mg via INTRAVENOUS

## 2011-09-27 NOTE — ED Notes (Signed)
First meeting with patient. Patient remains on monitor and sats of 100% on RA. Patient experiencing abdominal pain and states he was seen at [facility] yesterday and patient states pain x 4 days. Patient denies any pain except for abdominal pain and denies N/V/D/F. Family at bedside.

## 2011-09-27 NOTE — ED Provider Notes (Signed)
History     CSN: [id number]  Arrival date & time 09/27/11  2208   First MD Initiated Contact with Patient 09/27/11 2241      Chief Complaint  Patient presents with  . Abdominal Pain    (Consider location/radiation/quality/duration/timing/severity/associated sxs/prior treatment) HPI History provided by pt.   Pt c/o abd pain x 4 days.  Has had similar intermittently for the past 6 months.  Acutely worsened yesterday evening, he was evaluated at [facility], xray and labs were obtained and unremarkable and he was d/c'd home w/ pain/nausea medication.  Felt better today but increased in severity again, shortly after eating dinner.  Diffuse and radiates into chest.  No modifying factors.  No associated vomiting, diarrhea, blood in stool or urinary sx.  H/o bladder surgery.    Past Medical History  Diagnosis Date  . High cholesterol   . No pertinent past medical history   . COPD (chronic obstructive pulmonary disease)     Past Surgical History  Procedure Date  . Neck surgery   . Bladder neck reconstruction   . Esophagogastroduodenoscopy 03/02/2011    Procedure: ESOPHAGOGASTRODUODENOSCOPY (EGD);  Surgeon: Dalia Heading;  Location: [facility];  Service: Gastroenterology;  Laterality: N/A;  . Cardiac catheterization     Family History  Problem Relation Age of Onset  . Cancer Mother   . Malignant hyperthermia Mother   . Cancer Father   . Malignant hyperthermia Father   . Cancer Sister   . Malignant hyperthermia Sister     History  Substance Use Topics  . Smoking status: Current Everyday Smoker -- 1.0 packs/day  . Smokeless tobacco: Not on file  . Alcohol Use: No      Review of Systems  All other systems reviewed and are negative.    Allergies  Codeine and Tylox  Home Medications   Current Outpatient Rx  Name Route Sig Dispense Refill  . ACETAMINOPHEN 500 MG PO TABS Oral Take 1,000 mg by mouth every 6 (six) hours as needed. For pain     . PROMETHAZINE HCL 25 MG PO TABS  Oral Take 1 tablet (25 mg total) by mouth every 6 (six) hours as needed for nausea. 20 tablet 0    BP 140/78  Pulse 80  Temp(Src) 97.5 F (36.4 C) (Oral)  Resp 26  SpO2 99%  Physical Exam  Nursing note and vitals reviewed. Constitutional: He is oriented to person, place, and time. He appears well-developed and well-nourished. No distress.       Pt clutching abdomen and rolling in bed.  Appears very uncomfortable.   HENT:  Head: Normocephalic and atraumatic.  Eyes:       Normal appearance  Neck: Normal range of motion.  Cardiovascular: Normal rate and regular rhythm.   Pulmonary/Chest: Effort normal and breath sounds normal.  Abdominal: Soft. Bowel sounds are normal. He exhibits no distension and no mass. There is no rebound and no guarding.       Mild epigastric and moderate RUQ ttp w/ positive murphy's sign.  No CVA tenderness  Musculoskeletal: Normal range of motion.  Neurological: He is alert and oriented to person, place, and time.  Skin: Skin is warm and dry. No rash noted.  Psychiatric: He has a normal mood and affect. His behavior is normal.    ED Course  Procedures (including critical care time)  Labs Reviewed  CBC - Abnormal; Notable for the following:    WBC 13.3 (*)    All other components within  normal limits  DIFFERENTIAL - Abnormal; Notable for the following:    Neutro Abs 8.2 (*)    Monocytes Absolute 1.2 (*)    All other components within normal limits  COMPREHENSIVE METABOLIC PANEL - Abnormal; Notable for the following:    Glucose, Bld 118 (*)    Alkaline Phosphatase 29 (*) HEMOLYSIS AT THIS LEVEL MAY AFFECT RESULT   GFR calc non Af Amer 89 (*)    All other components within normal limits  URINALYSIS, ROUTINE W REFLEX MICROSCOPIC - Abnormal; Notable for the following:    Hgb urine dipstick TRACE (*)    All other components within normal limits  LIPASE, BLOOD  URINE MICROSCOPIC-ADD ON   Dg Abd Acute W/chest  09/27/2011  *RADIOLOGY REPORT*  Clinical  Data: Abdominal pain.  ACUTE ABDOMEN SERIES (ABDOMEN 2 VIEW & CHEST 1 VIEW)  Comparison: 03/02/2011  Findings: Emphysematous and chronic bronchitic changes in the lungs.  Bilateral apical pleural caps. Normal heart size and pulmonary vascularity.  No focal consolidation in the lungs.  No blunting of costophrenic angles.  Scattered gas and stool in the colon.  No small or large bowel dilatation.  No free intra-abdominal air.  No abnormal air fluid levels.  No radiopaque stones.  No significant change since previous study.  IMPRESSION: No evidence of active pulmonary disease.  Nonobstructive bowel gas pattern.  Original Report Authenticated By: Marlon Pel, M.D.     1. Cholelithiasis       MDM  Pt presents for the second time in 24 hours w/ abd pain.  Afebrile, uncomfortable appearing, epigastric and RUQ ttp w/ pos murphy's sign on exam.  Labs unremarkable.  US shows cholelithiasis w/out cholecystitis.  Results discussed w/ pt.  He received dilaudid w/ relief of pain but it returned following US exam.  Will treat with one more dose, po challenge and then d/c home w/ vicodin for pain and referral to [facility].  Pt has zofran at home.  Return precautions discussed.         Otilio Miu, [place] 09/28/11 [phone]

## 2011-09-27 NOTE — ED Notes (Signed)
C/o mid abdominal pain that radiates upward x 3 days; reports was seen last year here in August or September for same; has seen PCP for same also and has been referred to GI specialist; denies n/v/d; denies constipation; reports last BM yesterday evening and normal.

## 2011-09-27 NOTE — Discharge Instructions (Signed)
Abdominal Pain Abdominal pain can be caused by many things. Your caregiver decides the seriousness of your pain by an examination and possibly blood tests and X-rays. Many cases can be observed and treated at home. Most abdominal pain is not caused by a disease and will probably improve without treatment. However, in many cases, more time must pass before a clear cause of the pain can be found. Before that point, it may not be known if you need more testing, or if hospitalization or surgery is needed. HOME CARE INSTRUCTIONS   Do not take laxatives unless directed by your caregiver.   Take pain medicine only as directed by your caregiver.   Only take over-the-counter or prescription medicines for pain, discomfort, or fever as directed by your caregiver.   Try a clear liquid diet (broth, tea, or water) for as long as directed by your caregiver. Slowly move to a bland diet as tolerated.  SEEK IMMEDIATE MEDICAL CARE IF:   The pain does not go away.   You have a fever.   You keep throwing up (vomiting).   The pain is felt only in portions of the abdomen. Pain in the right side could possibly be appendicitis. In an adult, pain in the left lower portion of the abdomen could be colitis or diverticulitis.   You pass bloody or black tarry stools.  MAKE SURE YOU:   Understand these instructions.   Will watch your condition.   Will get help right away if you are not doing well or get worse.  Document Released: 03/23/2005 Document Revised: 06/02/2011 Document Reviewed: 01/30/2008 [facility] Patient Information 2012 [place], [place].  Clear liquids today. Medications for pain and nausea. Rest. Return of course for

## 2011-09-27 NOTE — ED Notes (Signed)
abd pain for the past 3-4 days.  He has been seen at [facility] x2 last pm and the pain started again tonight

## 2011-09-28 ENCOUNTER — Encounter (HOSPITAL_COMMUNITY)
Admission: EM | Disposition: A | Discharge status: Home / Self Care | Payer: Self-pay | Source: Ambulatory Visit | Attending: General Surgery

## 2011-09-28 ENCOUNTER — Encounter (HOSPITAL_COMMUNITY): Payer: Self-pay | Admitting: General Practice

## 2011-09-28 ENCOUNTER — Inpatient Hospital Stay (HOSPITAL_COMMUNITY): Payer: Medicare Other

## 2011-09-28 ENCOUNTER — Inpatient Hospital Stay (HOSPITAL_COMMUNITY): Payer: Medicare Other | Admitting: Anesthesiology

## 2011-09-28 ENCOUNTER — Encounter (HOSPITAL_COMMUNITY): Payer: Self-pay | Admitting: Anesthesiology

## 2011-09-28 ENCOUNTER — Emergency Department (HOSPITAL_COMMUNITY): Payer: Medicare Other

## 2011-09-28 ENCOUNTER — Encounter (HOSPITAL_COMMUNITY): Payer: Self-pay | Admitting: Radiology

## 2011-09-28 DIAGNOSIS — R1011 Right upper quadrant pain: Secondary | ICD-10-CM

## 2011-09-28 DIAGNOSIS — K8000 Calculus of gallbladder with acute cholecystitis without obstruction: Secondary | ICD-10-CM

## 2011-09-28 DIAGNOSIS — K8010 Calculus of gallbladder with chronic cholecystitis without obstruction: Secondary | ICD-10-CM

## 2011-09-28 DIAGNOSIS — D214 Benign neoplasm of connective and other soft tissue of abdomen: Secondary | ICD-10-CM

## 2011-09-28 HISTORY — PX: CHOLECYSTECTOMY: SHX55

## 2011-09-28 LAB — URINALYSIS, ROUTINE W REFLEX MICROSCOPIC
Glucose, UA: NEGATIVE mg/dL
Leukocytes, UA: NEGATIVE
Protein, ur: NEGATIVE mg/dL
pH: 7 (ref 5.0–8.0)

## 2011-09-28 LAB — URINE MICROSCOPIC-ADD ON

## 2011-09-28 LAB — COMPREHENSIVE METABOLIC PANEL
Albumin: 3.6 g/dL (ref 3.5–5.2)
BUN: 14 mg/dL (ref 6–23)
Chloride: 107 mEq/L (ref 96–112)
Creatinine, Ser: 0.94 mg/dL (ref 0.50–1.35)
GFR calc Af Amer: >90 mL/min (ref >90)
Glucose, Bld: 118 mg/dL — ABNORMAL HIGH (ref 70–99)
Total Bilirubin: 0.4 mg/dL (ref 0.3–1.2)
Total Protein: 7 g/dL (ref 6.0–8.3)

## 2011-09-28 LAB — LIPASE, BLOOD: Lipase: 57 U/L (ref 11–59)

## 2011-09-28 SURGERY — LAPAROSCOPIC CHOLECYSTECTOMY WITH INTRAOPERATIVE CHOLANGIOGRAM
Anesthesia: General | Wound class: Clean Contaminated

## 2011-09-28 MED ORDER — HEPARIN SODIUM (PORCINE) 5000 UNIT/ML IJ SOLN
5000.0000 [IU] | Freq: Three times a day (TID) | INTRAMUSCULAR | Status: DC
  Administered 2011-09-28 – 2011-09-29 (×2): 5000 [IU] via SUBCUTANEOUS
  Filled 2011-09-28 (×5): qty 1

## 2011-09-28 MED ORDER — PANTOPRAZOLE SODIUM 40 MG IV SOLR
40.0000 mg | Freq: Every day | INTRAVENOUS | Status: DC
  Filled 2011-09-28: qty 40

## 2011-09-28 MED ORDER — IOHEXOL 300 MG/ML  SOLN
20.0000 mL | INTRAMUSCULAR | Status: DC
Start: 2011-09-28 — End: 2011-09-28

## 2011-09-28 MED ORDER — LACTATED RINGERS IV SOLN
INTRAVENOUS | Status: DC | PRN
  Administered 2011-09-28 (×2): via INTRAVENOUS

## 2011-09-28 MED ORDER — ACETAMINOPHEN 325 MG PO TABS: 650.0000 mg | ORAL_TABLET | ORAL | Status: DC | PRN

## 2011-09-28 MED ORDER — ONDANSETRON HCL 4 MG/2ML IJ SOLN: 4.0000 mg | Freq: Four times a day (QID) | INTRAMUSCULAR | Status: DC | PRN

## 2011-09-28 MED ORDER — FENTANYL CITRATE 0.05 MG/ML IJ SOLN
INTRAMUSCULAR | Status: AC
  Filled 2011-09-28: qty 2

## 2011-09-28 MED ORDER — ONDANSETRON HCL 4 MG/2ML IJ SOLN
INTRAMUSCULAR | Status: DC | PRN
  Administered 2011-09-28: 4 mg via INTRAVENOUS

## 2011-09-28 MED ORDER — ROCURONIUM BROMIDE 100 MG/10ML IV SOLN
INTRAVENOUS | Status: DC | PRN
  Administered 2011-09-28: 10 mg via INTRAVENOUS
  Administered 2011-09-28: 50 mg via INTRAVENOUS

## 2011-09-28 MED ORDER — FENTANYL CITRATE 0.05 MG/ML IJ SOLN
INTRAMUSCULAR | Status: DC | PRN
  Administered 2011-09-28 (×2): 100 ug via INTRAVENOUS
  Administered 2011-09-28: 50 ug via INTRAVENOUS

## 2011-09-28 MED ORDER — NEOSTIGMINE METHYLSULFATE 1 MG/ML IJ SOLN
INTRAMUSCULAR | Status: DC | PRN
  Administered 2011-09-28: 5 mg via INTRAVENOUS

## 2011-09-28 MED ORDER — MIDAZOLAM HCL 5 MG/5ML IJ SOLN
INTRAMUSCULAR | Status: DC | PRN
  Administered 2011-09-28 (×2): 1 mg via INTRAVENOUS

## 2011-09-28 MED ORDER — MORPHINE SULFATE 4 MG/ML IJ SOLN
INTRAMUSCULAR | Status: AC
  Administered 2011-09-28: 10:00:00
  Filled 2011-09-28: qty 1

## 2011-09-28 MED ORDER — HYDROMORPHONE HCL PF 1 MG/ML IJ SOLN
0.2500 mg | INTRAMUSCULAR | Status: DC | PRN
  Administered 2011-09-28 (×2): 0.5 mg via INTRAVENOUS

## 2011-09-28 MED ORDER — CHLORHEXIDINE GLUCONATE 0.12 % MT SOLN: 15.0000 mL | Freq: Two times a day (BID) | OROMUCOSAL | Status: DC

## 2011-09-28 MED ORDER — BIOTENE DRY MOUTH MT LIQD: 15.0000 mL | Freq: Two times a day (BID) | OROMUCOSAL | Status: DC

## 2011-09-28 MED ORDER — KCL IN DEXTROSE-NACL 20-5-0.9 MEQ/L-%-% IV SOLN
INTRAVENOUS | Status: DC
  Filled 2011-09-28 (×3): qty 1000

## 2011-09-28 MED ORDER — HYDROMORPHONE HCL PF 1 MG/ML IJ SOLN
1.0000 mg | Freq: Once | INTRAMUSCULAR | Status: DC
  Filled 2011-09-28: qty 1

## 2011-09-28 MED ORDER — HYDROCODONE-ACETAMINOPHEN 5-325 MG PO TABS
1.0000 | ORAL_TABLET | ORAL | Status: DC | PRN
  Administered 2011-09-29: 2 via ORAL
  Filled 2011-09-28: qty 2
  Filled 2011-09-28: qty 1

## 2011-09-28 MED ORDER — ESMOLOL HCL 10 MG/ML IV SOLN
INTRAVENOUS | Status: DC | PRN
  Administered 2011-09-28: 20 mg via INTRAVENOUS
  Administered 2011-09-28: 10 mg via INTRAVENOUS

## 2011-09-28 MED ORDER — FENTANYL CITRATE 0.05 MG/ML IJ SOLN
50.0000 ug | INTRAMUSCULAR | Status: AC | PRN
  Administered 2011-09-28 (×2): 50 ug via INTRAVENOUS

## 2011-09-28 MED ORDER — BUPIVACAINE-EPINEPHRINE 0.25% -1:200000 IJ SOLN
INTRAMUSCULAR | Status: DC | PRN
  Administered 2011-09-28: 17 mL
  Administered 2011-09-28: 10 mL

## 2011-09-28 MED ORDER — GLYCOPYRROLATE 0.2 MG/ML IJ SOLN
INTRAMUSCULAR | Status: DC | PRN
  Administered 2011-09-28: .8 mg via INTRAVENOUS

## 2011-09-28 MED ORDER — ONDANSETRON HCL 4 MG/2ML IJ SOLN
4.0000 mg | Freq: Once | INTRAMUSCULAR | Status: AC
  Administered 2011-09-28: 4 mg via INTRAVENOUS
  Filled 2011-09-28: qty 2

## 2011-09-28 MED ORDER — MORPHINE SULFATE 2 MG/ML IJ SOLN
1.0000 mg | INTRAMUSCULAR | Status: DC | PRN
  Administered 2011-09-28 – 2011-09-29 (×2): 2 mg via INTRAVENOUS
  Filled 2011-09-28 (×2): qty 1

## 2011-09-28 MED ORDER — HYDROMORPHONE HCL PF 1 MG/ML IJ SOLN
1.0000 mg | Freq: Once | INTRAMUSCULAR | Status: AC
  Administered 2011-09-28: 1 mg via INTRAVENOUS
  Filled 2011-09-28: qty 1

## 2011-09-28 MED ORDER — ONDANSETRON HCL 4 MG PO TABS: 4.0000 mg | ORAL_TABLET | Freq: Four times a day (QID) | ORAL | Status: DC | PRN

## 2011-09-28 MED ORDER — HYDROCODONE-ACETAMINOPHEN 5-325 MG PO TABS: 2.0000 | ORAL_TABLET | ORAL | Status: DC | PRN

## 2011-09-28 MED ORDER — CIPROFLOXACIN IN D5W 400 MG/200ML IV SOLN
400.0000 mg | Freq: Two times a day (BID) | INTRAVENOUS | Status: DC
  Administered 2011-09-28: 400 mg via INTRAVENOUS
  Filled 2011-09-28 (×3): qty 200

## 2011-09-28 MED ORDER — CIPROFLOXACIN IN D5W 400 MG/200ML IV SOLN
400.0000 mg | Freq: Two times a day (BID) | INTRAVENOUS | Status: DC
  Administered 2011-09-28 – 2011-09-29 (×2): 400 mg via INTRAVENOUS
  Filled 2011-09-28 (×3): qty 200

## 2011-09-28 MED ORDER — HYDROMORPHONE HCL PF 1 MG/ML IJ SOLN
1.0000 mg | Freq: Once | INTRAMUSCULAR | Status: AC
  Administered 2011-09-28: 1 mg via INTRAVENOUS

## 2011-09-28 MED ORDER — SODIUM CHLORIDE 0.9 % IV SOLN
INTRAVENOUS | Status: DC | PRN
  Administered 2011-09-28: 17:00:00

## 2011-09-28 MED ORDER — PROPOFOL 10 MG/ML IV EMUL
INTRAVENOUS | Status: DC | PRN
  Administered 2011-09-28: 180 mg via INTRAVENOUS

## 2011-09-28 MED ORDER — SODIUM CHLORIDE 0.9 % IR SOLN
Status: DC | PRN
  Administered 2011-09-28 (×3): 1000 mL

## 2011-09-28 MED ORDER — LACTATED RINGERS IV SOLN
INTRAVENOUS | Status: DC
Start: 2011-09-28 — End: 2011-09-28
  Administered 2011-09-28: 15:00:00 via INTRAVENOUS

## 2011-09-28 MED ORDER — IOHEXOL 300 MG/ML  SOLN
100.0000 mL | Freq: Once | INTRAMUSCULAR | Status: AC | PRN
  Administered 2011-09-28: 100 mL via INTRAVENOUS

## 2011-09-28 MED ORDER — MORPHINE SULFATE 4 MG/ML IJ SOLN
4.0000 mg | INTRAMUSCULAR | Status: DC | PRN
  Administered 2011-09-28: 4 mg via INTRAVENOUS

## 2011-09-28 MED ORDER — KCL IN DEXTROSE-NACL 20-5-0.45 MEQ/L-%-% IV SOLN
INTRAVENOUS | Status: DC
  Administered 2011-09-28 – 2011-09-29 (×2): via INTRAVENOUS
  Filled 2011-09-28 (×3): qty 1000

## 2011-09-28 SURGICAL SUPPLY — 44 items
ADH SKN CLS APL DERMABOND .7 (GAUZE/BANDAGES/DRESSINGS) ×1
APPLIER CLIP ROT 10 11.4 M/L (STAPLE) ×2
APR CLP MED LRG 11.4X10 (STAPLE) ×1
BAG SPEC RTRVL LRG 6X4 10 (ENDOMECHANICALS) ×1
BLADE SURG ROTATE 9660 (MISCELLANEOUS) ×2 IMPLANT
CANISTER SUCTION 2500CC (MISCELLANEOUS) ×2 IMPLANT
CHLORAPREP W/TINT 26ML (MISCELLANEOUS) ×2 IMPLANT
CHOLANGIOGRAM CATH TAUT (CATHETERS) ×2 IMPLANT
CLIP APPLIE ROT 10 11.4 M/L (STAPLE) ×1 IMPLANT
CLOTH BEACON ORANGE TIMEOUT ST (SAFETY) ×2 IMPLANT
COVER MAYO STAND STRL (DRAPES) ×2 IMPLANT
COVER SURGICAL LIGHT HANDLE (MISCELLANEOUS) ×2 IMPLANT
DECANTER SPIKE VIAL GLASS SM (MISCELLANEOUS) ×2 IMPLANT
DERMABOND ADVANCED (GAUZE/BANDAGES/DRESSINGS) ×1
DERMABOND ADVANCED .7 DNX12 (GAUZE/BANDAGES/DRESSINGS) ×1 IMPLANT
DRAPE C-ARM 42X72 X-RAY (DRAPES) ×2 IMPLANT
DRAPE UTILITY 15X26 W/TAPE STR (DRAPE) ×4 IMPLANT
ELECT REM PT RETURN 9FT ADLT (ELECTROSURGICAL) ×2
ELECTRODE REM PT RTRN 9FT ADLT (ELECTROSURGICAL) ×1 IMPLANT
FILTER SMOKE EVAC LAPAROSHD (FILTER) ×2 IMPLANT
GLOVE SURG SIGNA 7.5 PF LTX (GLOVE) ×2 IMPLANT
GOWN STRL NON-REIN LRG LVL3 (GOWN DISPOSABLE) ×4 IMPLANT
GOWN STRL REIN XL XLG (GOWN DISPOSABLE) ×2 IMPLANT
HEMOSTAT SURGICEL 2X14 (HEMOSTASIS) ×2 IMPLANT
IV CATH 14GX2 1/4 (CATHETERS) ×2 IMPLANT
KIT BASIN OR (CUSTOM PROCEDURE TRAY) ×2 IMPLANT
KIT ROOM TURNOVER OR (KITS) ×2 IMPLANT
NS IRRIG 1000ML POUR BTL (IV SOLUTION) ×2 IMPLANT
PAD ARMBOARD 7.5X6 YLW CONV (MISCELLANEOUS) ×4 IMPLANT
POUCH SPECIMEN RETRIEVAL 10MM (ENDOMECHANICALS) ×2 IMPLANT
SCISSORS LAP 5X35 DISP (ENDOMECHANICALS) ×1 IMPLANT
SET IRRIG TUBING LAPAROSCOPIC (IRRIGATION / IRRIGATOR) ×2 IMPLANT
SLEEVE Z-THREAD 5X100MM (TROCAR) ×2 IMPLANT
SPECIMEN JAR SMALL (MISCELLANEOUS) ×3 IMPLANT
STOPCOCK 4 WAY LG BORE MALE ST (IV SETS) ×2 IMPLANT
SUT VIC AB 5-0 PS2 18 (SUTURE) ×2 IMPLANT
TOWEL OR 17X24 6PK STRL BLUE (TOWEL DISPOSABLE) ×2 IMPLANT
TOWEL OR 17X26 10 PK STRL BLUE (TOWEL DISPOSABLE) ×2 IMPLANT
TRAY LAPAROSCOPIC (CUSTOM PROCEDURE TRAY) ×2 IMPLANT
TROCAR XCEL BLUNT TIP 100MML (ENDOMECHANICALS) ×2 IMPLANT
TROCAR Z-THREAD FIOS 11X100 BL (TROCAR) ×2 IMPLANT
TROCAR Z-THREAD FIOS 5X100MM (TROCAR) ×2 IMPLANT
TUBING EXTENTION W/L.L. (IV SETS) ×2 IMPLANT
WATER STERILE IRR 1000ML POUR (IV SOLUTION) IMPLANT

## 2011-09-28 NOTE — ED Notes (Signed)
Attempted report x 1, will call back in 15

## 2011-09-28 NOTE — ED Notes (Signed)
Patient sleeping with NAD at this time and remains on monitor with sats of 98%. Family at bedside.

## 2011-09-28 NOTE — ED Provider Notes (Signed)
Medical screening examination/treatment/procedure(s) were conducted as a shared visit with non-physician practitioner(s) and myself.  I personally evaluated the patient during the encounter.. ultrasound and CT scan show cholecystitis and cholelithiasis. We'll consult surgery.  Donnetta Hutching, MD 09/28/11 0530

## 2011-09-28 NOTE — Preoperative (Signed)
Beta Blockers   Reason not to administer Beta Blockers:Not Applicable 

## 2011-09-28 NOTE — Anesthesia Postprocedure Evaluation (Signed)
Anesthesia Post Note  Patient: Gerald Salas  Procedure(s) Performed: Procedure(s) (LRB): LAPAROSCOPIC CHOLECYSTECTOMY WITH INTRAOPERATIVE CHOLANGIOGRAM (N/A)  Anesthesia type: General  Patient location: PACU  Post pain: Pain level controlled and Adequate analgesia  Post assessment: Post-op Vital signs reviewed, Patient's Cardiovascular Status Stable, Respiratory Function Stable, Patent Airway and Pain level controlled  Last Vitals:  Filed Vitals:   09/28/11 1718  BP:   Pulse:   Temp: 36.7 C  Resp:     Post vital signs: Reviewed and stable  Level of consciousness: awake, alert  and oriented  Complications: No apparent anesthesia complications

## 2011-09-28 NOTE — H&P (Signed)
Gerald Salas is an 61 y.o. male.   Chief Complaint: abd pain HPI: the pt is a 61 yo wm who presents with ruq pain since Friday. Pain has been constant and persistent. No nausea or vomiting. Pain radiates to back  Past Medical History  Diagnosis Date  . High cholesterol   . No pertinent past medical history   . COPD (chronic obstructive pulmonary disease)     Past Surgical History  Procedure Date  . Neck surgery   . Bladder neck reconstruction   . Esophagogastroduodenoscopy 03/02/2011    Procedure: ESOPHAGOGASTRODUODENOSCOPY (EGD);  Surgeon: Dalia Heading;  Location: [facility];  Service: Gastroenterology;  Laterality: N/A;  . Cardiac catheterization     Family History  Problem Relation Age of Onset  . Cancer Mother   . Malignant hyperthermia Mother   . Cancer Father   . Malignant hyperthermia Father   . Cancer Sister   . Malignant hyperthermia Sister    Social History:  reports that he has been smoking.  He does not have any smokeless tobacco history on file. He reports that he does not drink alcohol or use illicit drugs.  Allergies:  Allergies  Allergen Reactions  . Codeine Other (See Comments)    Veins pop out real bad  . Tylox (Oxycodone-Acetaminophen) Itching    Medications Prior to Admission  Medication Dose Route Frequency Provider Last Rate Last Dose  . HYDROmorphone (DILAUDID) injection 1 mg  1 mg Intravenous Once Donnetta Hutching, MD   1 mg at 09/27/11 0453  . HYDROmorphone (DILAUDID) injection 1 mg  1 mg Intravenous Once Donnetta Hutching, MD   1 mg at 09/27/11 2309  . HYDROmorphone (DILAUDID) injection 1 mg  1 mg Intravenous Once Otilio Miu, PA   1 mg at 09/28/11 0058  . HYDROmorphone (DILAUDID) injection 1 mg  1 mg Intravenous Once Donnetta Hutching, MD   1 mg at 09/28/11 0318  . iohexol (OMNIPAQUE) 300 MG/ML solution 100 mL  100 mL Intravenous Once PRN Medication Radiologist, MD   100 mL at 09/28/11 0427  . iohexol (OMNIPAQUE) 300 MG/ML solution 20 mL  20 mL  Oral Q1 Hr x 2 Medication Radiologist, MD      . ondansetron (ZOFRAN) injection 4 mg  4 mg Intravenous Once Donnetta Hutching, MD   4 mg at 09/27/11 0451  . sodium chloride 0.9 % bolus 1,000 mL  1,000 mL Intravenous Once Donnetta Hutching, MD   1,000 mL at 09/27/11 0450  . DISCONTD: 0.9 %  sodium chloride infusion   Intravenous Continuous Donnetta Hutching, MD       Medications Prior to Admission  Medication Sig Dispense Refill  . acetaminophen (TYLENOL) 500 MG tablet Take 1,000 mg by mouth every 6 (six) hours as needed. For pain       . HYDROcodone-acetaminophen (NORCO) 5-325 MG per tablet Take 2 tablets by mouth every 4 (four) hours as needed for pain.  24 tablet  0    Results for orders placed during the hospital encounter of 09/27/11 (from the past 48 hour(s))  CBC     Status: Abnormal   Collection Time   09/27/11 11:11 PM      Component Value Range Comment   WBC 13.3 (*) 4.0 - 10.5 (K/uL)    RBC 4.54  4.22 - 5.81 (MIL/uL)    Hemoglobin 14.6  13.0 - 17.0 (g/dL)    HCT 96.0  45.4 - 09.8 (%)    MCV 93.6  78.0 -  100.0 (fL)    MCH 32.2  26.0 - 34.0 (pg)    MCHC 34.4  30.0 - 36.0 (g/dL)    RDW 16.1  09.6 - 04.5 (%)    Platelets 191  150 - 400 (K/uL)   DIFFERENTIAL     Status: Abnormal   Collection Time   09/27/11 11:11 PM      Component Value Range Comment   Neutrophils Relative 62  43 - 77 (%)    Neutro Abs 8.2 (*) 1.7 - 7.7 (K/uL)    Lymphocytes Relative 25  12 - 46 (%)    Lymphs Abs 3.3  0.7 - 4.0 (K/uL)    Monocytes Relative 9  3 - 12 (%)    Monocytes Absolute 1.2 (*) 0.1 - 1.0 (K/uL)    Eosinophils Relative 4  0 - 5 (%)    Eosinophils Absolute 0.5  0.0 - 0.7 (K/uL)    Basophils Relative 0  0 - 1 (%)    Basophils Absolute 0.0  0.0 - 0.1 (K/uL)   COMPREHENSIVE METABOLIC PANEL     Status: Abnormal   Collection Time   09/27/11 11:11 PM      Component Value Range Comment   Sodium 141  135 - 145 (mEq/L)    Potassium 4.9  3.5 - 5.1 (mEq/L) HEMOLYSIS AT THIS LEVEL MAY AFFECT RESULT   Chloride 107  96 -  112 (mEq/L)    CO2 23  19 - 32 (mEq/L)    Glucose, Bld 118 (*) 70 - 99 (mg/dL)    BUN 14  6 - 23 (mg/dL)    Creatinine, Ser 4.09  0.50 - 1.35 (mg/dL)    Calcium 9.6  8.4 - 10.5 (mg/dL)    Total Protein 7.0  6.0 - 8.3 (g/dL)    Albumin 3.6  3.5 - 5.2 (g/dL)    AST 31  0 - 37 (U/L) HEMOLYSIS AT THIS LEVEL MAY AFFECT RESULT   ALT 32  0 - 53 (U/L) HEMOLYSIS AT THIS LEVEL MAY AFFECT RESULT   Alkaline Phosphatase 29 (*) 39 - 117 (U/L) HEMOLYSIS AT THIS LEVEL MAY AFFECT RESULT   Total Bilirubin 0.4  0.3 - 1.2 (mg/dL)    GFR calc non Af Amer 89 (*) >90 (mL/min)    GFR calc Af Amer >90  >90 (mL/min)   LIPASE, BLOOD     Status: Normal   Collection Time   09/27/11 11:11 PM      Component Value Range Comment   Lipase 57  11 - 59 (U/L)   URINALYSIS, ROUTINE W REFLEX MICROSCOPIC     Status: Abnormal   Collection Time   09/28/11  1:16 AM      Component Value Range Comment   Color, Urine YELLOW  YELLOW     APPearance CLEAR  CLEAR     Specific Gravity, Urine 1.014  1.005 - 1.030     pH 7.0  5.0 - 8.0     Glucose, UA NEGATIVE  NEGATIVE (mg/dL)    Hgb urine dipstick TRACE (*) NEGATIVE     Bilirubin Urine NEGATIVE  NEGATIVE     Ketones, ur NEGATIVE  NEGATIVE (mg/dL)    Protein, ur NEGATIVE  NEGATIVE (mg/dL)    Urobilinogen, UA 1.0  0.0 - 1.0 (mg/dL)    Nitrite NEGATIVE  NEGATIVE     Leukocytes, UA NEGATIVE  NEGATIVE    URINE MICROSCOPIC-ADD ON     Status: Normal   Collection Time   09/28/11  1:16  AM      Component Value Range Comment   WBC, UA 0-2  <3 (WBC/hpf)    RBC / HPF 3-6  <3 (RBC/hpf)    Bacteria, UA RARE  RARE     US Abdomen Complete  09/28/2011  *RADIOLOGY REPORT*  Clinical Data:  Right upper quadrant abdominal pain.  COMPLETE ABDOMINAL ULTRASOUND  Comparison:  02/22/2011.  Findings:  Gallbladder:  Contracted gallbladder with shadowing gallstones and gallbladder sludge.  No wall thickening or pericholecystic fluid. Positive sonographic Murphy's sign.  Common bile duct:  Normal in caliber  measuring a maximum of 5.84mm.  Liver:  The liver is sonographically unremarkable.  There is normal echogenicity without focal lesions or intrahepatic biliary dilatation.  IVC:  Normal caliber.  Pancreas:  Sonographically unremarkable.  Spleen:  Normal size and echogenicity without focal lesions.  Right Kidney:  11.0 cm in length. Normal renal cortical thickness and echogenicity without focal lesions or hydronephrosis. A 9 mm cyst is noted laterally.  Left Kidney:  11.3 cm in length. Normal renal cortical thickness and echogenicity without focal lesions or hydronephrosis.  A simple appearing 2 cm upper pole cyst is noted.  Abdominal aorta:  Normal caliber.  IMPRESSION:  1.  Stone and sludge filled contracted gallbladder.  No wall thickening or pericholecystic fluid but positive sonographic Murphy's sign. 2.  Normal caliber common bile duct. 3.  Unremarkable sonographic appearance of the liver, spleen, pancreas and both kidneys.  Small renal cysts are noted.  Original Report Authenticated By: P. Loralie Champagne, M.D.   Ct Abdomen Pelvis W Contrast  09/28/2011  *RADIOLOGY REPORT*  Clinical Data: Abdominal pain for 4 days.  CT ABDOMEN AND PELVIS WITH CONTRAST  Technique:  Multidetector CT imaging of the abdomen and pelvis was performed following the standard protocol during bolus administration of intravenous contrast.  Contrast:  100 ml Omnipaque 300  Comparison: 02/26/2011  Findings: Slight fibrosis in the lung bases.  Cholelithiasis with mild pericholecystic edema and infiltration in the surrounding fat.  Changes suggest cholecystitis in the appropriate clinical setting.  Punctate low attenuation lesions in the liver consistent with sub centimeter cysts and stable since previous study.  Spleen size is normal.  The pancreas, adrenal glands, and retroperitoneal lymph nodes are unremarkable.  Small sub centimeter renal parenchymal cysts are unchanged.  No hydronephrosis or solid mass in the kidneys.  Calcification and  mural thrombus in the aorta without aneurysm.  The stomach is unremarkable.  Small bowel are not significantly dilated.  No free fluid or free air in the abdomen.  Pelvis:  Prostate enlargement.  Right inguinal hernia containing nonobstructed small bowel loop.  Left inguinal hernia containing fat.  No free or loculated pelvic fluid collections.  No significant pelvic lymphadenopathy.  The appendix is normal. Degenerative changes in the lumbar spine.  IMPRESSION: Cholelithiasis with inflammatory changes in the gallbladder suggesting cholecystitis.  Right inguinal hernia contains a small bowel but without evidence of proximal obstruction.  Original Report Authenticated By: Marlon Pel, M.D.   Dg Abd Acute W/chest  09/27/2011  *RADIOLOGY REPORT*  Clinical Data: Abdominal pain.  ACUTE ABDOMEN SERIES (ABDOMEN 2 VIEW & CHEST 1 VIEW)  Comparison: 03/02/2011  Findings: Emphysematous and chronic bronchitic changes in the lungs.  Bilateral apical pleural caps. Normal heart size and pulmonary vascularity.  No focal consolidation in the lungs.  No blunting of costophrenic angles.  Scattered gas and stool in the colon.  No small or large bowel dilatation.  No free intra-abdominal air.  No abnormal air fluid levels.  No radiopaque stones.  No significant change since previous study.  IMPRESSION: No evidence of active pulmonary disease.  Nonobstructive bowel gas pattern.  Original Report Authenticated By: Marlon Pel, M.D.    Review of Systems  Constitutional: Negative.   HENT: Negative.   Eyes: Negative.   Respiratory: Negative.   Cardiovascular: Negative.   Gastrointestinal: Positive for abdominal pain.  Genitourinary: Negative.   Musculoskeletal: Negative.   Skin: Negative.   Neurological: Negative.   Endo/Heme/Allergies: Negative.   Psychiatric/Behavioral: Negative.     Blood pressure 121/67, pulse 62, temperature 98.1 F (36.7 C), temperature source Oral, resp. rate 11, SpO2  96.00%. Physical Exam  Constitutional: He is oriented to person, place, and time. He appears well-developed and well-nourished.  HENT:  Head: Normocephalic and atraumatic.  Eyes: Conjunctivae and EOM are normal. Pupils are equal, round, and reactive to light.  Neck: Normal range of motion. Neck supple.  Cardiovascular: Normal rate, regular rhythm and normal heart sounds.   Respiratory: Effort normal and breath sounds normal.  GI: Soft. There is tenderness.  Musculoskeletal: Normal range of motion.  Neurological: He is alert and oriented to person, place, and time.  Skin: Skin is warm and dry.  Psychiatric: He has a normal mood and affect. His behavior is normal.     Assessment/Plan Cholecystitis with cholelithiasis. Because of further painful episodes and possible pancreatitis i think he would benefit from having gallbladder removed. We will plan to admit and will talk with primary team about timing of possible cholecystectomy  TOTH III,Dayle Mcnerney S 09/28/2011, 5:21 AM

## 2011-09-28 NOTE — ED Notes (Signed)
Patient returned from CT

## 2011-09-28 NOTE — ED Notes (Signed)
Patient resting at this time with NAD. Patient remains on monitor and sats of 98% RA.

## 2011-09-28 NOTE — Transfer of Care (Signed)
Immediate Anesthesia Transfer of Care Note  Patient: Gerald Salas  Procedure(s) Performed: Procedure(s) (LRB): LAPAROSCOPIC CHOLECYSTECTOMY WITH INTRAOPERATIVE CHOLANGIOGRAM (N/A)  Patient Location: PACU  Anesthesia Type: General  Level of Consciousness: awake, alert  and patient cooperative  Airway & Oxygen Therapy: Patient Spontanous Breathing and Patient connected to face mask oxygen  Post-op Assessment: Report given to PACU RN  Post vital signs: Reviewed and stable  Complications: No apparent anesthesia complications

## 2011-09-28 NOTE — Brief Op Note (Signed)
09/27/2011 - 09/28/2011  5:12 PM  PATIENT:  Gerald Salas, 61 y.o., male, MRN: [id number]  PREOP DIAGNOSIS:  gb disease  POSTOP DIAGNOSIS:   Acute and chronic cholecystitis, cholelithiasis, RUQ/epigastric abdominal wall mass - 4 cm.  PROCEDURE:   Procedure(s): LAPAROSCOPIC CHOLECYSTECTOMY WITH INTRAOPERATIVE CHOLANGIOGRAM, excision of RUQ/epigastric abdominal wall mass.excision - 4 cm  SURGEON:   Ovidio Kin, M.D.  ASSISTANT:   Barrie Dunker, MD  ANESTHESIA:   general  Raiford Simmonds, MD - Anesthesiologist Marni Griffon, CRNA - CRNA Ervin Knack, CRNA - CRNA  General  EBL:  Minimal  ml  BLOOD ADMINISTERED: none  DRAINS: none   LOCAL MEDICATIONS USED:   25 cc 1/4% marcaine  SPECIMEN:   Gall bladder and abdominal wall mass.  COUNTS CORRECT:  YES  INDICATIONS FOR PROCEDURE:  Gerald Salas is a 61 y.o. (DOB: 1951-03-09) white male whose primary care physician is GUEST, Loretha Stapler, MD, MD and comes for gall bladder surgery.   The indications and risks of the surgery were explained to the patient.  The risks include, but are not limited to, infection, bleeding, and nerve injury.  Note dictated to:   [phone]

## 2011-09-28 NOTE — ED Notes (Signed)
Per Dr Ezzard Standing will plan on surgery for today around 2pm, pt continues to be NPO until further notice. Pt and wife aware of plan of care.

## 2011-09-28 NOTE — ED Notes (Signed)
Consent placed on chart.

## 2011-09-28 NOTE — Op Note (Signed)
Gerald Salas NO.:  [id number]  MEDICAL RECORD NO.:  [id number]  LOCATION:  5124                         FACILITY:  [facility]  PHYSICIAN:  Gerald Salas, M.D.  DATE OF BIRTH:  02/04/1951  DATE OF PROCEDURE:  09/28/2011                              OPERATIVE REPORT   PREOPERATIVE DIAGNOSIS:  Cholecystitis, cholelithiasis.  POSTOPERATIVE DIAGNOSES:  Acute and chronic cholecystitis, cholelithiasis and right upper quadrant/epigastric mass (lipoma) approximately 4 cm in size.  PROCEDURES:  Laparoscopic cholecystectomy with intraoperative cholangiogram, excision of right upper quadrant/epigastric abdominal wall mass, 4 cm in size.  SURGEON:  Gerald Salas, M.D.  FIRST ASSISTANT:  Gerald Salas, M.D.  ANESTHESIA:  General endotracheal supervised by Gerald Salas.  ESTIMATED BLOOD LOSS:  Minimal.  Local use was 25 mL of 0.25% Marcaine with epinephrine.  COMPLICATIONS:  None.  INDICATION FOR PROCEDURE:  Gerald Salas is a 61 year old white male who sees Gerald Salas, is his primary medical doctor, developed pain on Friday, September 23, 2011, with worsening abdominal pain, came to the emergency room today where on evaluation was seen by Gerald Salas earlier this morning.  Evaluation revealed cholelithiasis with cholecystitis.  I discussed with the patient the indications, potential complications of gallbladder surgery.  Potential complications include, but are not limited to bleeding, infection, common bile duct injury, and the possibility of open surgery.  OPERATIVE NOTE:  The patient placed in a supine position in room #17 with general anesthesia supervised by Gerald Salas.  A time-out was held and surgical checklist was run.    His abdomen was prepped with ChloraPrep and sterilely draped.  Infraumbilical incision with sharp dissection was carried down into the abdominal cavity.  A 0 degree 10-mm laparoscope was inserted through a 12-mm Hasson  trocar, and the Hasson trocar was secured with a 0 Vicryl suture.  I placed an 11-mm trocar in the subxiphoid to right subcostal area and this incision went through a 4-cm lipoma.  I had to go right through to put a trocar site, so I made an incision big enough to remove the lipoma and I removed it.  This was sent off to Pathology, and this measured about 4 cm, and then I put the 11-mm trocar into the abdominal cavity.  I placed a 5-mm right mid subcostal, 5 mm lateral subcostal.  The abdomen was explored.  He has a right inguinal hernia seen laparoscopically.  His right and left lobes of liver unremarkable.  His stomach was unremarkable.  His gallbladder was acute and chronic inflammation, was very thick-walled and scarred.  It was actually hard to grasp with a 1000 dollar raspers.  The gallbladder was grasped.  I dissected out and identified a cystic duct and cystic artery.  I placed a clip at the cystic artery.  I placed a single clip on the gallbladder side of the cystic duct and shot intraoperative cholangiogram.  Intraoperative cholangiogram was shot using a cutoff taut catheter and inserted through this cut cystic duct and secured with an Endoclip.  I used about 12 mL of Omnipaque that showed free flow of contrast down the cystic duct into the common bile duct  and up the hepatic radicals.  The cystic duct comes in right at the bifurcation of the right and left hepatic radicals.  There is free flow into the duodenum.  There is no obstruction.  This is felt to be a normal intraoperative cholangiogram. The taut catheter was then removed.    The cystic duct, triply endo clipped and divided the cystic artery.  The artery had been clipped and was divided.  The gallbladder was then sharply and bluntly dissected from the gallbladder bed, again noting there was really thick wall gallbladder in the gallbladder bed.  After completely dividing the gallbladder, it was placed in EndoCatch bag  and delivered through the umbilicus.  I then reinspected the gallbladder bed before I irrigated this.  He did have some bleeding from the gallbladder bed that I controlled with electrocautery.  I did place Surgicel in the gallbladder bed.  I irrigated the abdomen with about a liter and half of saline, total.  I closed the umbilical port with 0 Vicryl suture.  I infiltrated each port with 0.25% Marcaine with epinephrine, used a total about 25 mL.  I closed the skin with a 5-0 Vicryl suture and painted the wound with Dermabond.    The patient tolerated the procedure well, was transported to the recovery room in good condition.  A photo from the cholangiogram was placed in the chart.  His sponge and needle count were correct.  He was doing well.   Gerald Salas, M.D.,FACS   DHN/MEDQ  D:  09/28/2011  T:  09/28/2011  Job:  [id number]

## 2011-09-28 NOTE — Progress Notes (Signed)
Dr. Billey Chang note reviewed.   Pt is a patient of Dr. Patrina Levering.   He's on disability since 2010 for "numbness" in his arms after neck problems.  Dr. Danielle Dess did neck surgery in about 1994.  He smokes and knows it is bad for his health.  He has incidental right inguinal hernia.   He has cholecystitis/cholelithiasis.  I discussed with the patient the indications and risks of gall bladder surgery.  The primary risks of gall bladder surgery include, but are not limited to, bleeding, infection, common bile duct injury, and open surgery.  There is also the risk that the patient may have continued symptoms after surgery.  However, the likelihood of improvement in symptoms and return to the patient's normal status is good. We discussed the typical post-operative recovery course. I tried to answer the patient's questions.    His wife was in the room during the discussion.  They live in [place] (near [place]).

## 2011-09-28 NOTE — ED Notes (Signed)
Patient transported to CT 

## 2011-09-28 NOTE — ED Notes (Addendum)
Patient given ginger ale to try and drink. Patient remains on monitor and sats of 97% on RA. Patient c/o abdominal pain at this time. Family at bedside.

## 2011-09-28 NOTE — ED Notes (Signed)
CT called and advised patient has finished drinking contrast and is ready for CT.

## 2011-09-28 NOTE — ED Provider Notes (Signed)
History     CSN: [id number]  Arrival date & time 09/27/11  2208   First MD Initiated Contact with Patient 09/27/11 2241      Chief Complaint  Patient presents with  . Abdominal Pain    (Consider location/radiation/quality/duration/timing/severity/associated sxs/prior treatment) HPI  Past Medical History  Diagnosis Date  . High cholesterol   . No pertinent past medical history   . COPD (chronic obstructive pulmonary disease)     Past Surgical History  Procedure Date  . Neck surgery   . Bladder neck reconstruction   . Esophagogastroduodenoscopy 03/02/2011    Procedure: ESOPHAGOGASTRODUODENOSCOPY (EGD);  Surgeon: Dalia Heading;  [facility];  Service: Gastroenterology;  Laterality: N/A;  . Cardiac catheterization     Family History  Problem Relation Age of Onset  . Cancer Mother   . Malignant hyperthermia Mother   . Cancer Father   . Malignant hyperthermia Father   . Cancer Sister   . Malignant hyperthermia Sister     History  Substance Use Topics  . Smoking status: Current Everyday Smoker -- 1.0 packs/day  . Smokeless tobacco: Not on file  . Alcohol Use: No      Review of Systems  Allergies  Codeine and Tylox  Home Medications   Current Outpatient Rx  Name Route Sig Dispense Refill  . ACETAMINOPHEN 500 MG PO TABS Oral Take 1,000 mg by mouth every 6 (six) hours as needed. For pain     . HYDROCODONE-ACETAMINOPHEN 5-325 MG PO TABS Oral Take 2 tablets by mouth every 4 (four) hours as needed for pain. 24 tablet 0  . PROMETHAZINE HCL 25 MG PO TABS Oral Take 1 tablet (25 mg total) by mouth every 6 (six) hours as needed for nausea. 20 tablet 0    BP 127/75  Pulse 58  Temp(Src) 98.1 F (36.7 C) (Oral)  Resp 15  SpO2 99%  Physical Exam  ED Course  Procedures (including critical care time)  Labs Reviewed  CBC - Abnormal; Notable for the following:    WBC 13.3 (*)    All other components within normal limits  DIFFERENTIAL - Abnormal;  Notable for the following:    Neutro Abs 8.2 (*)    Monocytes Absolute 1.2 (*)    All other components within normal limits  COMPREHENSIVE METABOLIC PANEL - Abnormal; Notable for the following:    Glucose, Bld 118 (*)    Alkaline Phosphatase 29 (*) HEMOLYSIS AT THIS LEVEL MAY AFFECT RESULT   GFR calc non Af Amer 89 (*)    All other components within normal limits  URINALYSIS, ROUTINE W REFLEX MICROSCOPIC - Abnormal; Notable for the following:    Hgb urine dipstick TRACE (*)    All other components within normal limits  LIPASE, BLOOD  URINE MICROSCOPIC-ADD ON   US Abdomen Complete  09/28/2011  *RADIOLOGY REPORT*  Clinical Data:  Right upper quadrant abdominal pain.  COMPLETE ABDOMINAL ULTRASOUND  Comparison:  02/22/2011.  Findings:  Gallbladder:  Contracted gallbladder with shadowing gallstones and gallbladder sludge.  No wall thickening or pericholecystic fluid. Positive sonographic Murphy's sign.  Common bile duct:  Normal in caliber measuring a maximum of 5.65mm.  Liver:  The liver is sonographically unremarkable.  There is normal echogenicity without focal lesions or intrahepatic biliary dilatation.  IVC:  Normal caliber.  Pancreas:  Sonographically unremarkable.  Spleen:  Normal size and echogenicity without focal lesions.  Right Kidney:  11.0 cm in length. Normal renal cortical thickness and echogenicity without  focal lesions or hydronephrosis. A 9 mm cyst is noted laterally.  Left Kidney:  11.3 cm in length. Normal renal cortical thickness and echogenicity without focal lesions or hydronephrosis.  A simple appearing 2 cm upper pole cyst is noted.  Abdominal aorta:  Normal caliber.  IMPRESSION:  1.  Stone and sludge filled contracted gallbladder.  No wall thickening or pericholecystic fluid but positive sonographic Murphy's sign. 2.  Normal caliber common bile duct. 3.  Unremarkable sonographic appearance of the liver, spleen, pancreas and both kidneys.  Small renal cysts are noted.  Original  Report Authenticated By: P. Loralie Champagne, M.D.   Ct Abdomen Pelvis W Contrast  09/28/2011  *RADIOLOGY REPORT*  Clinical Data: Abdominal pain for 4 days.  CT ABDOMEN AND PELVIS WITH CONTRAST  Technique:  Multidetector CT imaging of the abdomen and pelvis was performed following the standard protocol during bolus administration of intravenous contrast.  Contrast:  100 ml Omnipaque 300  Comparison: 02/26/2011  Findings: Slight fibrosis in the lung bases.  Cholelithiasis with mild pericholecystic edema and infiltration in the surrounding fat.  Changes suggest cholecystitis in the appropriate clinical setting.  Punctate low attenuation lesions in the liver consistent with sub centimeter cysts and stable since previous study.  Spleen size is normal.  The pancreas, adrenal glands, and retroperitoneal lymph nodes are unremarkable.  Small sub centimeter renal parenchymal cysts are unchanged.  No hydronephrosis or solid mass in the kidneys.  Calcification and mural thrombus in the aorta without aneurysm.  The stomach is unremarkable.  Small bowel are not significantly dilated.  No free fluid or free air in the abdomen.  Pelvis:  Prostate enlargement.  Right inguinal hernia containing nonobstructed small bowel loop.  Left inguinal hernia containing fat.  No free or loculated pelvic fluid collections.  No significant pelvic lymphadenopathy.  The appendix is normal. Degenerative changes in the lumbar spine.  IMPRESSION: Cholelithiasis with inflammatory changes in the gallbladder suggesting cholecystitis.  Right inguinal hernia contains a small bowel but without evidence of proximal obstruction.  Original Report Authenticated By: Marlon Pel, M.D.   Dg Abd Acute W/chest  09/27/2011  *RADIOLOGY REPORT*  Clinical Data: Abdominal pain.  ACUTE ABDOMEN SERIES (ABDOMEN 2 VIEW & CHEST 1 VIEW)  Comparison: 03/02/2011  Findings: Emphysematous and chronic bronchitic changes in the lungs.  Bilateral apical pleural caps. Normal  heart size and pulmonary vascularity.  No focal consolidation in the lungs.  No blunting of costophrenic angles.  Scattered gas and stool in the colon.  No small or large bowel dilatation.  No free intra-abdominal air.  No abnormal air fluid levels.  No radiopaque stones.  No significant change since previous study.  IMPRESSION: No evidence of active pulmonary disease.  Nonobstructive bowel gas pattern.  Original Report Authenticated By: Marlon Pel, M.D.     1. Cholelithiasis       MDM  Medical screening examination/treatment/procedure(s) were conducted as a shared visit with non-physician practitioner(s) and myself.  I personally evaluated the patient during the encounter.... examined patient and reviewed ultrasound and CT scan.  CT scan suggests cholelithiasis and cholecystitis. White count 13 K. Liver functions normal. We'll consult general surgery.        Donnetta Hutching, MD 09/28/11 [phone]

## 2011-09-28 NOTE — Anesthesia Preprocedure Evaluation (Signed)
Anesthesia Evaluation  Patient identified by MRN, date of birth, ID band Patient awake    Reviewed: Allergy & Precautions, H&P , NPO status , Patient's Chart, lab work & pertinent test results  Airway Mallampati: II  Neck ROM: full    Dental   Pulmonary COPDCurrent Smoker,          Cardiovascular     Neuro/Psych    GI/Hepatic GERD-  ,  Endo/Other    Renal/GU      Musculoskeletal   Abdominal   Peds  Hematology   Anesthesia Other Findings   Reproductive/Obstetrics                           Anesthesia Physical Anesthesia Plan  ASA: II  Anesthesia Plan: General   Post-op Pain Management:    Induction: Intravenous  Airway Management Planned: Oral ETT  Additional Equipment:   Intra-op Plan:   Post-operative Plan: Extubation in OR  Informed Consent: I have reviewed the patients History and Physical, chart, labs and discussed the procedure including the risks, benefits and alternatives for the proposed anesthesia with the patient or authorized representative who has indicated his/her understanding and acceptance.     Plan Discussed with: CRNA and Surgeon  Anesthesia Plan Comments:         Anesthesia Quick Evaluation

## 2011-09-28 NOTE — ED Notes (Signed)
Patient returned from CT.  Patient ambulated independently to the bathroom.

## 2011-09-28 NOTE — ED Notes (Addendum)
Wrong patient

## 2011-09-29 ENCOUNTER — Encounter (HOSPITAL_COMMUNITY): Payer: Self-pay | Admitting: Surgery

## 2011-09-29 MED ORDER — HYDROCODONE-ACETAMINOPHEN 5-325 MG PO TABS: 1.0000 | ORAL_TABLET | ORAL | Status: AC | PRN

## 2011-09-29 NOTE — Discharge Summary (Signed)
Patient ID: Gerald Salas MRN: [id number] DOB/AGE: 61/30/1952 61 y.o.  Admit date: 09/27/2011 Discharge date: 09/29/2011  Procedures: lap chole with IOC (negative)  Consults: None  Reason for Admission: This is a 61 yo male who presented to the [facility] with c/o RUQ abdominal pain.  He was found to have acute cholecystitis and was admitted.  Please see admitting H&P for further details.   Admission Diagnoses:  1. Acute cholecystitis 2. COPD 3. GERD  Hospital Course: The patient was admitted and taken to the operating room where he underwent a laparoscopic cholecystectomy with IOC, which was negative.  The patient tolerated the procedure well.  Postoperatively, his diet was advanced as tolerated.  By POD#1, he was tolerating solid food and oral pain medication.  He was felt stable for discharge home at this time.  PE: Abd: soft, appropriately tender, +BS, ND, incisions were c/d/i with dermabond present.  Discharge Diagnoses:  Active Problems:  Cholecystitis with cholelithiasis s/p lap chole COPD GERD  Looks good.  Wife in room.  Discharge instructions reviewed.  Home today.  DN  Discharge Medications: Medication List  As of 09/29/2011  8:30 AM   TAKE these medications         acetaminophen 500 MG tablet   Commonly known as: TYLENOL   Take 1,000 mg by mouth every 6 (six) hours as needed. For pain      HYDROcodone-acetaminophen 5-325 MG per tablet   Commonly known as: NORCO   Take 1-2 tablets by mouth every 4 (four) hours as needed.      promethazine 25 MG tablet   Commonly known as: PHENERGAN   Take 1 tablet (25 mg total) by mouth every 6 (six) hours as needed for nausea.            Discharge Instructions: Follow-up Information    Follow up with Jhayden Demuro H, MD. Schedule an appointment as soon as possible for a visit in 3 weeks.   Contact information:   [employer] [address] [place] [address] [phone]            Signed: Letha Cape 09/29/2011, 8:30 AM  Ovidio Kin, MD, [facility] Surgery Pager: [phone] Office phone:  [phone]

## 2011-09-29 NOTE — Progress Notes (Signed)
Utilization review completed. Gerald Salas Diane4/09/2011  

## 2011-09-29 NOTE — Discharge Instructions (Signed)
[facility] ______[facility] °LAPAROSCOPIC SURGERY: POST OP INSTRUCTIONS °Always review your discharge instruction sheet given to you by the facility where your surgery was performed. °IF YOU HAVE DISABILITY OR FAMILY LEAVE FORMS, YOU MUST BRING THEM TO THE OFFICE FOR PROCESSING.   °DO NOT GIVE THEM TO YOUR DOCTOR. ° °1. A prescription for pain medication may be given to you upon discharge.  Take your pain medication as prescribed, if needed.  If narcotic pain medicine is not needed, then you may take acetaminophen (Tylenol) or ibuprofen (Advil) as needed. °2. Take your usually prescribed medications unless otherwise directed. °3. If you need a refill on your pain medication, please contact your pharmacy.  They will contact our office to request authorization. Prescriptions will not be filled after 5pm or on week-ends. °4. You should follow a light diet the first few days after arrival home, such as soup and crackers, etc.  Be sure to include lots of fluids daily. °5. Most patients will experience some swelling and bruising in the area of the incisions.  Ice packs will help.  Swelling and bruising can take several days to resolve.  °6. It is common to experience some constipation if taking pain medication after surgery.  Increasing fluid intake and taking a stool softener (such as Colace) will usually help or prevent this problem from occurring.  A mild laxative (Milk of Magnesia or Miralax) should be taken according to package instructions if there are no bowel movements after 48 hours. °7. Unless discharge instructions indicate otherwise, you may remove your bandages 24-48 hours after surgery, and you may shower at that time.  You may have steri-strips (small skin tapes) in place directly over the incision.  These strips should be left on the skin for 7-10 days.  If your surgeon used skin glue on the incision, you may shower in 24 hours.  The glue will flake off over the next 2-3 weeks.  Any sutures or  staples will be removed at the office during your follow-up visit. °8. ACTIVITIES:  You may resume regular (light) daily activities beginning the next day--such as daily self-care, walking, climbing stairs--gradually increasing activities as tolerated.  You may have sexual intercourse when it is comfortable.  Refrain from any heavy lifting or straining until approved by your doctor. °a. You may drive when you are no longer taking prescription pain medication, you can comfortably wear a seatbelt, and you can safely maneuver your car and apply brakes. °b. RETURN TO WORK:  __________________________________________________________ °9. You should see your doctor in the office for a follow-up appointment approximately 2-3 weeks after your surgery.  Make sure that you call for this appointment within a day or two after you arrive home to insure a convenient appointment time. °10. OTHER INSTRUCTIONS: __________________________________________________________________________________________________________________________ __________________________________________________________________________________________________________________________ °WHEN TO CALL YOUR DOCTOR: °1. Fever over 101.0 °2. Inability to urinate °3. Continued bleeding from incision. °4. Increased pain, redness, or drainage from the incision. °5. Increasing abdominal pain ° °The clinic staff is available to answer your questions during regular business hours.  Please don’t hesitate to call and ask to speak to one of the nurses for clinical concerns.  If you have a medical emergency, go to the nearest emergency room or call 911.  A surgeon from [facility] is always on call at the hospital. °[address], [address], [place], [place]  [address] ? [address] [address], [place], [place]   [address] °[phone] ? [phone] ? FAX [phone] °Web site:   [email or web address] °

## 2011-09-29 NOTE — Progress Notes (Signed)
DC home with wife,DC instructions given to pt/wife/son. Verbally understood. No questions/concerns at DC.

## 2011-09-30 ENCOUNTER — Telehealth (INDEPENDENT_AMBULATORY_CARE_PROVIDER_SITE_OTHER): Payer: Self-pay

## 2011-09-30 NOTE — Telephone Encounter (Signed)
I called the patient and gave him a postop appointment for 10/20/11 at 4:30.

## 2011-10-15 NOTE — ED Provider Notes (Signed)
History     CSN: [id number]  Arrival date & time 09/27/11  7829   First MD Initiated Contact with Patient 09/27/11 0310      Chief Complaint  Patient presents with  . Abdominal Pain    (Consider location/radiation/quality/duration/timing/severity/associated sxs/prior treatment) HPI.Marland KitchenMarland Kitchenperiumbilical abdominal pain for 3 days with radiation to epigastrium.  No nausea, vomiting, diarrhea, fever. Nothing makes symptoms better or worse. Described as minimal to moderate. Described as sharp.  Past Medical History  Diagnosis Date  . High cholesterol   . COPD (chronic obstructive pulmonary disease)   . Angina   . GERD (gastroesophageal reflux disease)   . Headache   . Arthritis   . Adenomatous colon polyp 10/2007  . Gallstones     Past Surgical History  Procedure Date  . Bladder neck reconstruction   . Esophagogastroduodenoscopy 03/02/2011    Procedure: ESOPHAGOGASTRODUODENOSCOPY (EGD);  Surgeon: Dalia Heading;  [facility];  Service: Gastroenterology;  Laterality: N/A;  . Cardiac catheterization   . Anterior cervical decomp/discectomy fusion 1994    S/P neck fracture  . Tonsillectomy and adenoidectomy 1960's  . Hand surgery 1980's    "got it crushed"  . Carpal tunnel release 1993    left  . Cholecystectomy 09/28/11  . Cholecystectomy 09/28/2011    Procedure: LAPAROSCOPIC CHOLECYSTECTOMY WITH INTRAOPERATIVE CHOLANGIOGRAM;  Surgeon: Kandis Cocking, MD;  Location: [facility] OR;  Service: General;  Laterality: N/A;    Family History  Problem Relation Age of Onset  . Cancer Mother   . Malignant hyperthermia Mother   . Cancer Father   . Malignant hyperthermia Father   . Cancer Sister   . Malignant hyperthermia Sister     History  Substance Use Topics  . Smoking status: Current Everyday Smoker -- 1.0 packs/day for 34 years    Types: Cigarettes  . Smokeless tobacco: Former Neurosurgeon    Types: Chew    Quit date: 06/27/1970  . Alcohol Use: No      Review of Systems  All  other systems reviewed and are negative.    Allergies  Codeine and Tylox  Home Medications   Current Outpatient Rx  Name Route Sig Dispense Refill  . ACETAMINOPHEN 500 MG PO TABS Oral Take 1,000 mg by mouth every 6 (six) hours as needed. For pain     . PROMETHAZINE HCL 25 MG PO TABS Oral Take 1 tablet (25 mg total) by mouth every 6 (six) hours as needed for nausea. 20 tablet 0    BP 102/50  Pulse 63  Temp(Src) 97.5 F (36.4 C) (Oral)  Resp 20  Ht 5\' 11"  (1.803 m)  Wt 185 lb (83.915 kg)  BMI 25.80 kg/m2  SpO2 95%  Physical Exam  Nursing note and vitals reviewed. Constitutional: He is oriented to person, place, and time. He appears well-developed and well-nourished.  HENT:  Head: Normocephalic and atraumatic.  Eyes: Conjunctivae and EOM are normal. Pupils are equal, round, and reactive to light.  Neck: Normal range of motion. Neck supple.  Cardiovascular: Normal rate and regular rhythm.   Pulmonary/Chest: Effort normal and breath sounds normal.  Abdominal: Bowel sounds are normal.       Slight periumbilical tenderness  Musculoskeletal: Normal range of motion.  Neurological: He is alert and oriented to person, place, and time.  Skin: Skin is warm and dry.  Psychiatric: He has a normal mood and affect.    ED Course  Procedures (including critical care time)  Labs Reviewed  CBC -  Abnormal; Notable for the following:    WBC 12.5 (*)    All other components within normal limits  DIFFERENTIAL - Abnormal; Notable for the following:    Neutro Abs 8.1 (*)    All other components within normal limits  COMPREHENSIVE METABOLIC PANEL - Abnormal; Notable for the following:    Glucose, Bld 117 (*)    Alkaline Phosphatase 30 (*)    GFR calc non Af Amer 88 (*)    All other components within normal limits  LIPASE, BLOOD - Abnormal; Notable for the following:    Lipase 64 (*)    All other components within normal limits  LAB REPORT - SCANNED  No results found. No results  found.   1. Abdominal pain       MDM  No acute abdomen. X-ray shows no obstruction.  Patient and well enough  to go home.        Donnetta Hutching, MD 10/15/11 [phone]

## 2011-10-18 ENCOUNTER — Ambulatory Visit: Payer: Medicare Other | Admitting: Gastroenterology

## 2011-10-20 ENCOUNTER — Ambulatory Visit (INDEPENDENT_AMBULATORY_CARE_PROVIDER_SITE_OTHER): Payer: Medicare Other | Admitting: Surgery

## 2011-10-20 ENCOUNTER — Encounter (INDEPENDENT_AMBULATORY_CARE_PROVIDER_SITE_OTHER): Payer: Self-pay | Admitting: Surgery

## 2011-10-20 VITALS — BP 118/73 | HR 70 | Temp 98.0°F | Ht 71.0 in | Wt 172.4 lb

## 2011-10-20 DIAGNOSIS — K8010 Calculus of gallbladder with chronic cholecystitis without obstruction: Secondary | ICD-10-CM

## 2011-10-20 NOTE — Progress Notes (Signed)
Post Op Visit   Surgery:  Lap chole with IOC  Patient had a gangrenous GB.  Date of Surgery: 4/3/20133  Location of Surgery: [place]  BP 118/73  Pulse 70  Temp(Src) 98 F (36.7 C) (Temporal)  Ht 5\' 11"  (1.803 m)  Wt 172 lb 6.4 oz (78.2 kg)  BMI 24.04 kg/m2  SpO2 98%  Findings: He's on disability since 2010 for "numbness" in his arms after neck problems. Dr. Danielle Dess did neck surgery in about 1994.  He smokes and knows it is bad for his health.  He has incidental right inguinal hernia.  He has done well from the surgery.  His wounds look good and he had no concerns.  His wife is with him.  I gave him a copy of his path report.  Return to office PRN  Gerald Kin, MD, [facility] Surgery Pager: [phone] Office phone:  [phone]

## 2011-11-07 ENCOUNTER — Encounter: Payer: Self-pay | Admitting: Gastroenterology

## 2011-11-07 ENCOUNTER — Ambulatory Visit (INDEPENDENT_AMBULATORY_CARE_PROVIDER_SITE_OTHER): Payer: Medicare Other | Admitting: Gastroenterology

## 2011-11-07 VITALS — BP 120/64 | HR 72 | Ht 71.0 in | Wt 171.0 lb

## 2011-11-07 DIAGNOSIS — Z86010 Personal history of colonic polyps: Secondary | ICD-10-CM

## 2011-11-07 DIAGNOSIS — K8010 Calculus of gallbladder with chronic cholecystitis without obstruction: Secondary | ICD-10-CM

## 2011-11-07 NOTE — Patient Instructions (Addendum)
You will be due for a recall colonoscopy in 10/2012. We will send you a reminder in the mail when it gets closer to that time.  cc: Robert Bellow, MD

## 2011-11-07 NOTE — Progress Notes (Signed)
History of Present Illness: This is a 61 year old male who relates a several month history of epigastric pain that worsened in severity. I reviewed records over the past few months including his hospitalization in [facility]. He underwent upper endoscopy in September 2012 by Dr. Franky Macho in [place] that was negative. He underwent cholecystectomy with intraoperative cholangiogram for a gangrenous gallbladder in April by Dr. Ovidio Kin. His abdominal complaints completely resolved following surgery. He has no gastrointestinal complaints today. Denies weight loss, abdominal pain, constipation, diarrhea, change in stool caliber, melena, hematochezia, nausea, vomiting, dysphagia, reflux symptoms, chest pain.  Review of Systems: Pertinent positive and negative review of systems were noted in the above HPI section. All other review of systems were otherwise negative.  Current Medications, Allergies, Past Medical History, Past Surgical History, Family History and Social History were reviewed in [employer] record.  Physical Exam: General: Well developed , well nourished, no acute distress Head: Normocephalic and atraumatic Eyes:  sclerae anicteric, EOMI Ears: Normal auditory acuity Mouth: No deformity or lesions Neck: Supple, no masses or thyromegaly Lungs: Clear throughout to auscultation Heart: Regular rate and rhythm; no murmurs, rubs or bruits Abdomen: Soft, non tender and non distended. No masses, hepatosplenomegaly or hernias noted. Normal Bowel sounds Musculoskeletal: Symmetrical with no gross deformities  Skin: No lesions on visible extremities Pulses:  Normal pulses noted Extremities: No clubbing, cyanosis, edema or deformities noted Neurological: Alert oriented x 4, grossly nonfocal Cervical Nodes:  No significant cervical adenopathy Inguinal Nodes: No significant inguinal adenopathy Psychological:  Alert and cooperative. Normal mood and affect  Assessment and  Recommendations:  1. Abd pain-resolved post cholecystectomy.   2. History of adenomatous colon polyps. He is due for a five-year surveillance colonoscopy in may 2014.

## 2012-10-29 ENCOUNTER — Encounter: Payer: Self-pay | Admitting: Gastroenterology

## 2014-03-07 ENCOUNTER — Encounter: Payer: Self-pay | Admitting: Gastroenterology

## 2014-08-16 ENCOUNTER — Encounter: Payer: Self-pay | Admitting: Gastroenterology

## 2014-10-24 DIAGNOSIS — E0500 Thyrotoxicosis with diffuse goiter without thyrotoxic crisis or storm: Secondary | ICD-10-CM | POA: Diagnosis not present

## 2014-10-24 DIAGNOSIS — H16123 Filamentary keratitis, bilateral: Secondary | ICD-10-CM | POA: Diagnosis not present

## 2014-10-24 DIAGNOSIS — H04123 Dry eye syndrome of bilateral lacrimal glands: Secondary | ICD-10-CM | POA: Diagnosis not present

## 2015-02-26 ENCOUNTER — Encounter (HOSPITAL_COMMUNITY): Payer: Self-pay | Admitting: Emergency Medicine

## 2015-02-26 ENCOUNTER — Observation Stay (HOSPITAL_COMMUNITY)
Admission: EM | Admit: 2015-02-26 | Discharge: 2015-02-27 | Disposition: A | Discharge status: Home / Self Care | Payer: Medicare Other | Attending: Family Medicine | Admitting: Family Medicine

## 2015-02-26 ENCOUNTER — Emergency Department (HOSPITAL_COMMUNITY): Payer: Medicare Other

## 2015-02-26 DIAGNOSIS — I209 Angina pectoris, unspecified: Secondary | ICD-10-CM | POA: Diagnosis not present

## 2015-02-26 DIAGNOSIS — Z720 Tobacco use: Secondary | ICD-10-CM | POA: Insufficient documentation

## 2015-02-26 DIAGNOSIS — E780 Pure hypercholesterolemia: Secondary | ICD-10-CM | POA: Insufficient documentation

## 2015-02-26 DIAGNOSIS — E0590 Thyrotoxicosis, unspecified without thyrotoxic crisis or storm: Secondary | ICD-10-CM | POA: Insufficient documentation

## 2015-02-26 DIAGNOSIS — Z79899 Other long term (current) drug therapy: Secondary | ICD-10-CM | POA: Insufficient documentation

## 2015-02-26 DIAGNOSIS — F17200 Nicotine dependence, unspecified, uncomplicated: Secondary | ICD-10-CM | POA: Insufficient documentation

## 2015-02-26 DIAGNOSIS — M1990 Unspecified osteoarthritis, unspecified site: Secondary | ICD-10-CM | POA: Insufficient documentation

## 2015-02-26 DIAGNOSIS — J449 Chronic obstructive pulmonary disease, unspecified: Secondary | ICD-10-CM | POA: Diagnosis not present

## 2015-02-26 DIAGNOSIS — K8020 Calculus of gallbladder without cholecystitis without obstruction: Secondary | ICD-10-CM | POA: Diagnosis not present

## 2015-02-26 DIAGNOSIS — K219 Gastro-esophageal reflux disease without esophagitis: Secondary | ICD-10-CM | POA: Insufficient documentation

## 2015-02-26 DIAGNOSIS — M542 Cervicalgia: Secondary | ICD-10-CM | POA: Insufficient documentation

## 2015-02-26 DIAGNOSIS — H538 Other visual disturbances: Secondary | ICD-10-CM | POA: Diagnosis not present

## 2015-02-26 DIAGNOSIS — G8929 Other chronic pain: Secondary | ICD-10-CM | POA: Diagnosis present

## 2015-02-26 DIAGNOSIS — R29810 Facial weakness: Secondary | ICD-10-CM | POA: Diagnosis not present

## 2015-02-26 DIAGNOSIS — Z86010 Personal history of colonic polyps: Secondary | ICD-10-CM | POA: Insufficient documentation

## 2015-02-26 DIAGNOSIS — G459 Transient cerebral ischemic attack, unspecified: Secondary | ICD-10-CM

## 2015-02-26 DIAGNOSIS — I639 Cerebral infarction, unspecified: Principal | ICD-10-CM | POA: Insufficient documentation

## 2015-02-26 HISTORY — DX: Thyrotoxicosis, unspecified without thyrotoxic crisis or storm: E05.90

## 2015-02-26 LAB — COMPREHENSIVE METABOLIC PANEL
ALK PHOS: 28 U/L — AB (ref 38–126)
ALT: 32 U/L (ref 17–63)
AST: 34 U/L (ref 15–41)
Albumin: 4.5 g/dL (ref 3.5–5.0)
Anion gap: 6 (ref 5–15)
BUN: 17 mg/dL (ref 6–20)
CALCIUM: 9.1 mg/dL (ref 8.9–10.3)
CHLORIDE: 106 mmol/L (ref 101–111)
CO2: 26 mmol/L (ref 22–32)
CREATININE: 1.34 mg/dL — AB (ref 0.61–1.24)
GFR calc Af Amer: >60 mL/min (ref >60)
GFR calc non Af Amer: 55 mL/min — ABNORMAL LOW (ref >60)
Glucose, Bld: 89 mg/dL (ref 65–99)
Potassium: 3.8 mmol/L (ref 3.5–5.1)
SODIUM: 138 mmol/L (ref 135–145)
Total Bilirubin: 0.4 mg/dL (ref 0.3–1.2)
Total Protein: 7.8 g/dL (ref 6.5–8.1)

## 2015-02-26 LAB — I-STAT CHEM 8, ED
BUN: 18 mg/dL (ref 6–20)
CHLORIDE: 103 mmol/L (ref 101–111)
CREATININE: 1.4 mg/dL — AB (ref 0.61–1.24)
Calcium, Ion: 1.2 mmol/L (ref 1.13–1.30)
Glucose, Bld: 83 mg/dL (ref 65–99)
HEMATOCRIT: 48 % (ref 39.0–52.0)
Hemoglobin: 16.3 g/dL (ref 13.0–17.0)
POTASSIUM: 3.8 mmol/L (ref 3.5–5.1)
Sodium: 141 mmol/L (ref 135–145)
TCO2: 25 mmol/L (ref 0–100)

## 2015-02-26 LAB — DIFFERENTIAL
BASOS ABS: 0 10*3/uL (ref 0.0–0.1)
BASOS PCT: 0 % (ref 0–1)
Eosinophils Absolute: 0.3 10*3/uL (ref 0.0–0.7)
Eosinophils Relative: 3 % (ref 0–5)
LYMPHS PCT: 34 % (ref 12–46)
Lymphs Abs: 3.2 10*3/uL (ref 0.7–4.0)
MONOS PCT: 8 % (ref 3–12)
Monocytes Absolute: 0.8 10*3/uL (ref 0.1–1.0)
NEUTROS ABS: 5.1 10*3/uL (ref 1.7–7.7)
Neutrophils Relative %: 55 % (ref 43–77)

## 2015-02-26 LAB — CBC
HEMATOCRIT: 46.4 % (ref 39.0–52.0)
Hemoglobin: 16.4 g/dL (ref 13.0–17.0)
MCH: 34.8 pg — ABNORMAL HIGH (ref 26.0–34.0)
MCHC: 35.3 g/dL (ref 30.0–36.0)
MCV: 98.5 fL (ref 78.0–100.0)
PLATELETS: 169 10*3/uL (ref 150–400)
RBC: 4.71 MIL/uL (ref 4.22–5.81)
RDW: 12.8 % (ref 11.5–15.5)
WBC: 9.3 10*3/uL (ref 4.0–10.5)

## 2015-02-26 LAB — PROTIME-INR
INR: 1.01 (ref 0.00–1.49)
Prothrombin Time: 13.5 seconds (ref 11.6–15.2)

## 2015-02-26 LAB — I-STAT TROPONIN, ED: Troponin i, poc: 0 ng/mL (ref 0.00–0.08)

## 2015-02-26 LAB — APTT: APTT: 51 s — AB (ref 24–37)

## 2015-02-26 LAB — CBG MONITORING, ED: Glucose-Capillary: 88 mg/dL (ref 65–99)

## 2015-02-26 MED ORDER — ASPIRIN 81 MG PO CHEW: 324.0000 mg | CHEWABLE_TABLET | Freq: Once | ORAL | Status: DC

## 2015-02-26 NOTE — ED Provider Notes (Signed)
CSN: [id number]     Arrival date & time 02/26/15  2104 History  This chart was scribed for Gerald Sorrow, MD by Helane Gunther, ED Scribe. This patient was seen in room APA05/APA05 and the patient's care was started at 10:32 PM.     Chief Complaint  Patient presents with  . Facial Droop   The history is provided by the patient, the spouse and a relative. No language interpreter was used.   HPI Comments: Gerald Xiong. is a 64 y.o. male smoker at 1 ppd with a PMHx of COPD and high cholesterol who presents to the Emergency Department complaining of a right-sided facial droop onset 1:30 PM today. He states it began as right-sided weakness of the neck, arm, and leg. He reports associated right-sided neck stiffness. He states he did not notice the right facial droop. Per wife, she noticed it at 8:00 PM. Per daughter, he was last seen normal at 1:00 PM. He denies a PMHx of stroke. Pt denies any other pains.  Past Medical History  Diagnosis Date  . High cholesterol   . COPD (chronic obstructive pulmonary disease)   . Angina   . GERD (gastroesophageal reflux disease)   . Headache(784.0)   . Arthritis   . Adenomatous colon polyp 10/2007  . Gallstones   . Thyroid disease    Past Surgical History  Procedure Laterality Date  . Bladder neck reconstruction    . Esophagogastroduodenoscopy  03/02/2011    Procedure: ESOPHAGOGASTRODUODENOSCOPY (EGD);  Surgeon: Jamesetta So;  Location: [facility];  Service: Gastroenterology;  Laterality: N/A;  . Cardiac catheterization    . Anterior cervical decomp/discectomy fusion  1994    S/P neck fracture  . Tonsillectomy and adenoidectomy  1960's  . Hand surgery  1980's    "got it crushed"  . Carpal tunnel release  1993    left  . Cholecystectomy  09/28/2011    Procedure: LAPAROSCOPIC CHOLECYSTECTOMY WITH INTRAOPERATIVE CHOLANGIOGRAM;  Surgeon: Shann Medal, MD;  Location: [facility] OR;  Service: General;  Laterality: N/A;   Family History  Problem  Relation Age of Onset  . Cancer Mother   . Malignant hyperthermia Mother   . Cancer Father     brain tumor/?prostate  . Malignant hyperthermia Father   . Cancer Sister   . Malignant hyperthermia Sister   . Cancer Son     breast  . Cancer Paternal Aunt     breast   Social History  Substance Use Topics  . Smoking status: Current Every Day Smoker -- 1.00 packs/day for 34 years    Types: Cigarettes  . Smokeless tobacco: Former Systems developer    Types: [place] date: 06/27/1970     Comment: Counseling sheet given 10-2011   . Alcohol Use: No    Review of Systems  Constitutional: Negative for fever and chills.  HENT: Negative for rhinorrhea and sore throat.   Eyes: Positive for visual disturbance (Blurred vision).  Respiratory: Negative for cough and shortness of breath.   Cardiovascular: Negative for chest pain and leg swelling.  Gastrointestinal: Negative for nausea, vomiting, abdominal pain and diarrhea.  Genitourinary: Negative for dysuria and hematuria.  Musculoskeletal: Positive for neck pain and neck stiffness.  Skin: Negative for rash.  Neurological: Positive for facial asymmetry, speech difficulty (rthis morning), weakness (R arm and leg) and numbness (bilateral hands). Negative for headaches.  Hematological: Does not bruise/bleed easily.  Psychiatric/Behavioral: Negative for confusion.    Allergies  Codeine; Tylox;  and Oxycodone-acetaminophen  Home Medications   Prior to Admission medications   Medication Sig Start Date End Date Taking? Authorizing Provider  methimazole (TAPAZOLE) 10 MG tablet Take 40 mg by mouth daily.   Yes Historical Provider, MD  acetaminophen (TYLENOL) 500 MG tablet Take 1,000 mg by mouth every 6 (six) hours as needed. For pain     Historical Provider, MD   BP 107/94 mmHg  Resp 14 Physical Exam  Constitutional: He is oriented to person, place, and time. He appears well-developed and well-nourished.  HENT:  Head: Normocephalic and atraumatic.   Mouth/Throat: Oropharynx is clear and moist.  Eyes: Conjunctivae and EOM are normal. Pupils are equal, round, and reactive to light. Right eye exhibits no discharge. Left eye exhibits no discharge. No scleral icterus.  Peripheral vision seems to be grossly intact.  Cardiovascular: Normal rate, regular rhythm and normal heart sounds.   Pulmonary/Chest: Effort normal and breath sounds normal. No respiratory distress.  Abdominal: Soft. Bowel sounds are normal. There is no tenderness.  Musculoskeletal: He exhibits no edema.  No swelling to the ankles  Neurological: He is alert and oriented to person, place, and time. Coordination normal.  Slightly weaker on the right side for straight leg raise. Weaker in the right hand. Weaker in the right arm. Right facial weakness/droop.  Skin: Skin is warm and dry. No rash noted. He is not diaphoretic. No erythema.  Psychiatric: He has a normal mood and affect.  Nursing note and vitals reviewed.   ED Course  Procedures   COORDINATION OF CARE: 10:40 PM - Discussed probable stroke. Discussed normal head CT. Discussed plans to admit to the hospital. May order MRI tonight, if available. Pt advised of plan for treatment and pt agrees.  Labs Review Labs Reviewed  APTT - Abnormal; Notable for the following:    aPTT 51 (*)    All other components within normal limits  CBC - Abnormal; Notable for the following:    MCH 34.8 (*)    All other components within normal limits  COMPREHENSIVE METABOLIC PANEL - Abnormal; Notable for the following:    Creatinine, Ser 1.34 (*)    Alkaline Phosphatase 28 (*)    GFR calc non Af Amer 55 (*)    All other components within normal limits  I-STAT CHEM 8, ED - Abnormal; Notable for the following:    Creatinine, Ser 1.40 (*)    All other components within normal limits  PROTIME-INR  DIFFERENTIAL  I-STAT TROPOININ, ED  CBG MONITORING, ED   Results for orders placed or performed during the hospital encounter of  02/26/15  Protime-INR  Result Value Ref Range   Prothrombin Time 13.5 11.6 - 15.2 seconds   INR 1.01 0.00 - 1.49  APTT  Result Value Ref Range   aPTT 51 (H) 24 - 37 seconds  CBC  Result Value Ref Range   WBC 9.3 4.0 - 10.5 K/uL   RBC 4.71 4.22 - 5.81 MIL/uL   Hemoglobin 16.4 13.0 - 17.0 g/dL   HCT 46.4 39.0 - 52.0 %   MCV 98.5 78.0 - 100.0 fL   MCH 34.8 (H) 26.0 - 34.0 pg   MCHC 35.3 30.0 - 36.0 g/dL   RDW 12.8 11.5 - 15.5 %   Platelets 169 150 - 400 K/uL  Differential  Result Value Ref Range   Neutrophils Relative % 55 43 - 77 %   Neutro Abs 5.1 1.7 - 7.7 K/uL   Lymphocytes Relative 34 12 - 46 %  Lymphs Abs 3.2 0.7 - 4.0 K/uL   Monocytes Relative 8 3 - 12 %   Monocytes Absolute 0.8 0.1 - 1.0 K/uL   Eosinophils Relative 3 0 - 5 %   Eosinophils Absolute 0.3 0.0 - 0.7 K/uL   Basophils Relative 0 0 - 1 %   Basophils Absolute 0.0 0.0 - 0.1 K/uL  Comprehensive metabolic panel  Result Value Ref Range   Sodium 138 135 - 145 mmol/L   Potassium 3.8 3.5 - 5.1 mmol/L   Chloride 106 101 - 111 mmol/L   CO2 26 22 - 32 mmol/L   Glucose, Bld 89 65 - 99 mg/dL   BUN 17 6 - 20 mg/dL   Creatinine, Ser 1.34 (H) 0.61 - 1.24 mg/dL   Calcium 9.1 8.9 - 10.3 mg/dL   Total Protein 7.8 6.5 - 8.1 g/dL   Albumin 4.5 3.5 - 5.0 g/dL   AST 34 15 - 41 U/L   ALT 32 17 - 63 U/L   Alkaline Phosphatase 28 (L) 38 - 126 U/L   Total Bilirubin 0.4 0.3 - 1.2 mg/dL   GFR calc non Af Amer 55 (L) >60 mL/min   GFR calc Af Amer >60 >60 mL/min   Anion gap 6 5 - 15  I-stat troponin, ED (not at [facility], [facility])  Result Value Ref Range   Troponin i, poc 0.00 0.00 - 0.08 ng/mL   Comment 3          CBG monitoring, ED  Result Value Ref Range   Glucose-Capillary 88 65 - 99 mg/dL  I-Stat Chem 8, ED  (not at [facility], [facility])  Result Value Ref Range   Sodium 141 135 - 145 mmol/L   Potassium 3.8 3.5 - 5.1 mmol/L   Chloride 103 101 - 111 mmol/L   BUN 18 6 - 20 mg/dL   Creatinine, Ser 1.40 (H) 0.61 - 1.24 mg/dL   Glucose,  Bld 83 65 - 99 mg/dL   Calcium, Ion 1.20 1.13 - 1.30 mmol/L   TCO2 25 0 - 100 mmol/L   Hemoglobin 16.3 13.0 - 17.0 g/dL   HCT 48.0 39.0 - 52.0 %     Imaging Review Ct Head Wo Contrast  02/26/2015   CLINICAL DATA:  Right-sided facial droop.  EXAM: CT HEAD WITHOUT CONTRAST  TECHNIQUE: Contiguous axial images were obtained from the base of the skull through the vertex without intravenous contrast.  COMPARISON:  None  FINDINGS: There is no intracranial hemorrhage, mass or evidence of acute infarction. There is no extra-axial fluid collection. Gray matter and white matter appear normal. Cerebral volume is normal for age. Brainstem and posterior fossa are unremarkable. The CSF spaces appear normal.  The bony structures are intact. The visible portions of the paranasal sinuses are clear.  IMPRESSION: Normal brain   Electronically Signed   By: Andreas Newport M.D.   On: 02/26/2015 21:53   I have personally reviewed and evaluated these images and lab results as part of my medical decision-making.   EKG Interpretation   Date/Time:  Thursday February 26 2015 21:18:57 EDT Ventricular Rate:  59 PR Interval:  180 QRS Duration: 100 QT Interval:  429 QTC Calculation: 425 R Axis:   76 Text Interpretation:  Sinus rhythm Probable left ventricular hypertrophy  Confirmed by Sonji Starkes  MD, Deondre Marinaro ([id number]) on 02/26/2015 10:23:59 PM      CRITICAL CARE Performed by: Gerald Salas Total critical care time: 30 Critical care time was exclusive of separately billable procedures and treating other  patients. Critical care was necessary to treat or prevent imminent or life-threatening deterioration. Critical care was time spent personally by me on the following activities: development of treatment plan with patient and/or surrogate as well as nursing, discussions with consultants, evaluation of patient's response to treatment, examination of patient, obtaining history from patient or surrogate, ordering and  performing treatments and interventions, ordering and review of laboratory studies, ordering and review of radiographic studies, pulse oximetry and re-evaluation of patient's condition.    MDM   Final diagnoses:  Cerebral infarction due to unspecified mechanism   patient discussed with Dr. Rochele Pages the neural hospitalist down to [place] patient stable to stay here can have MRI tomorrow. Will contact hospital service for admission. Patient last seen normal according to daughter at 1:00 everything was fine. Some time after that he started to get right arm and right leg weakness. His wife noted that he had right facial droop at 8 in the evening. Not exactly sure when that started. Head CT negative. Patient without any prior history of strokes however does have risk factors high cholesterol history of coronary artery disease. Blood pressure here is well controlled. Labs without significant abnormalities. EKG without any acute changes.     I personally performed the services described in this documentation, which was scribed in my presence. The recorded information has been reviewed and is accurate.    Gerald Sorrow, MD 02/26/15 [phone]

## 2015-02-26 NOTE — ED Notes (Addendum)
Patient states he was outside mowing today at 1330 and felt "a stiffness" on the right side of my face. States he laid down and when wife came home this evening she noticed the right facial droop. Patient alert and oriented at this time. Patient also complaining of right sided neck pain starting upon awakening this morning.

## 2015-02-26 NOTE — ED Notes (Signed)
Dr.zackoski at bedside

## 2015-02-27 ENCOUNTER — Observation Stay (HOSPITAL_COMMUNITY): Payer: Medicare Other

## 2015-02-27 ENCOUNTER — Encounter (HOSPITAL_COMMUNITY): Payer: Self-pay

## 2015-02-27 ENCOUNTER — Observation Stay (HOSPITAL_BASED_OUTPATIENT_CLINIC_OR_DEPARTMENT_OTHER): Payer: Medicare Other

## 2015-02-27 DIAGNOSIS — E0590 Thyrotoxicosis, unspecified without thyrotoxic crisis or storm: Secondary | ICD-10-CM | POA: Diagnosis present

## 2015-02-27 DIAGNOSIS — I639 Cerebral infarction, unspecified: Secondary | ICD-10-CM | POA: Diagnosis not present

## 2015-02-27 DIAGNOSIS — J449 Chronic obstructive pulmonary disease, unspecified: Secondary | ICD-10-CM | POA: Diagnosis present

## 2015-02-27 DIAGNOSIS — R531 Weakness: Secondary | ICD-10-CM | POA: Diagnosis not present

## 2015-02-27 DIAGNOSIS — M542 Cervicalgia: Secondary | ICD-10-CM | POA: Diagnosis not present

## 2015-02-27 DIAGNOSIS — I6350 Cerebral infarction due to unspecified occlusion or stenosis of unspecified cerebral artery: Secondary | ICD-10-CM

## 2015-02-27 DIAGNOSIS — H538 Other visual disturbances: Secondary | ICD-10-CM | POA: Diagnosis not present

## 2015-02-27 DIAGNOSIS — F17200 Nicotine dependence, unspecified, uncomplicated: Secondary | ICD-10-CM | POA: Insufficient documentation

## 2015-02-27 DIAGNOSIS — E785 Hyperlipidemia, unspecified: Secondary | ICD-10-CM

## 2015-02-27 DIAGNOSIS — K219 Gastro-esophageal reflux disease without esophagitis: Secondary | ICD-10-CM

## 2015-02-27 DIAGNOSIS — G8929 Other chronic pain: Secondary | ICD-10-CM

## 2015-02-27 DIAGNOSIS — J438 Other emphysema: Secondary | ICD-10-CM

## 2015-02-27 DIAGNOSIS — Z720 Tobacco use: Secondary | ICD-10-CM

## 2015-02-27 DIAGNOSIS — G459 Transient cerebral ischemic attack, unspecified: Secondary | ICD-10-CM

## 2015-02-27 DIAGNOSIS — I63232 Cerebral infarction due to unspecified occlusion or stenosis of left carotid arteries: Secondary | ICD-10-CM | POA: Diagnosis not present

## 2015-02-27 DIAGNOSIS — I63231 Cerebral infarction due to unspecified occlusion or stenosis of right carotid arteries: Secondary | ICD-10-CM | POA: Diagnosis not present

## 2015-02-27 LAB — BASIC METABOLIC PANEL
ANION GAP: 4 — AB (ref 5–15)
BUN: 17 mg/dL (ref 6–20)
CHLORIDE: 107 mmol/L (ref 101–111)
CO2: 25 mmol/L (ref 22–32)
Calcium: 8.5 mg/dL — ABNORMAL LOW (ref 8.9–10.3)
Creatinine, Ser: 1.25 mg/dL — ABNORMAL HIGH (ref 0.61–1.24)
GFR calc non Af Amer: 60 mL/min — ABNORMAL LOW (ref >60)
Glucose, Bld: 79 mg/dL (ref 65–99)
POTASSIUM: 3.7 mmol/L (ref 3.5–5.1)
Sodium: 136 mmol/L (ref 135–145)

## 2015-02-27 LAB — LIPID PANEL
CHOLESTEROL: 208 mg/dL — AB (ref 0–200)
HDL: 30 mg/dL — ABNORMAL LOW (ref >40)
LDL Cholesterol: 134 mg/dL — ABNORMAL HIGH (ref 0–99)
Total CHOL/HDL Ratio: 6.9 RATIO
Triglycerides: 220 mg/dL — ABNORMAL HIGH (ref <150)
VLDL: 44 mg/dL — ABNORMAL HIGH (ref 0–40)

## 2015-02-27 LAB — MAGNESIUM: MAGNESIUM: 2 mg/dL (ref 1.7–2.4)

## 2015-02-27 MED ORDER — ASPIRIN 81 MG PO CHEW: 324.0000 mg | CHEWABLE_TABLET | Freq: Once | ORAL | Status: DC

## 2015-02-27 MED ORDER — ASPIRIN 325 MG PO TABS: 325.0000 mg | ORAL_TABLET | Freq: Every day | ORAL | Status: DC

## 2015-02-27 MED ORDER — SODIUM CHLORIDE 0.9 % IJ SOLN
INTRAMUSCULAR | Status: AC
  Filled 2015-02-27: qty 15

## 2015-02-27 MED ORDER — STROKE: EARLY STAGES OF RECOVERY BOOK
Freq: Once | Status: AC
  Administered 2015-02-27: 10:00:00
  Filled 2015-02-27: qty 1

## 2015-02-27 MED ORDER — METHIMAZOLE 5 MG PO TABS
40.0000 mg | ORAL_TABLET | Freq: Every day | ORAL | Status: DC
  Administered 2015-02-27: 40 mg via ORAL
  Filled 2015-02-27: qty 4
  Filled 2015-02-27 (×3): qty 8

## 2015-02-27 MED ORDER — ACETAMINOPHEN 500 MG PO TABS: 500.0000 mg | ORAL_TABLET | Freq: Four times a day (QID) | ORAL | Status: DC | PRN

## 2015-02-27 MED ORDER — ACETAMINOPHEN 500 MG PO TABS
1000.0000 mg | ORAL_TABLET | Freq: Four times a day (QID) | ORAL | Status: DC | PRN
  Administered 2015-02-27: 1000 mg via ORAL
  Filled 2015-02-27: qty 2

## 2015-02-27 MED ORDER — ATORVASTATIN CALCIUM 40 MG PO TABS
40.0000 mg | ORAL_TABLET | Freq: Every day | ORAL | Status: DC
  Administered 2015-02-27: 40 mg via ORAL
  Filled 2015-02-27: qty 1

## 2015-02-27 MED ORDER — ASPIRIN 300 MG RE SUPP
300.0000 mg | Freq: Every day | RECTAL | Status: DC
Start: 2015-02-27 — End: 2015-02-27
  Filled 2015-02-27 (×4): qty 1

## 2015-02-27 MED ORDER — ATORVASTATIN CALCIUM 40 MG PO TABS: 40.0000 mg | ORAL_TABLET | Freq: Every day | ORAL | Status: DC

## 2015-02-27 MED ORDER — ASPIRIN 325 MG PO TABS
325.0000 mg | ORAL_TABLET | Freq: Every day | ORAL | Status: DC
  Administered 2015-02-27 (×2): 325 mg via ORAL
  Filled 2015-02-27 (×2): qty 1

## 2015-02-27 MED ORDER — SODIUM CHLORIDE 0.9 % IV SOLN
INTRAVENOUS | Status: AC
  Filled 2015-02-27: qty 150

## 2015-02-27 NOTE — Progress Notes (Signed)
Patient with orders to be discharge home. Discharge instructions given, patient verbalized understanding. Prescriptions given. Patient stable. Patient left in private vehicle with family.  

## 2015-02-27 NOTE — Progress Notes (Signed)
PROGRESS NOTE  Gerald Salas. ZSW:[id number] DOB: 04-Feb-1951 DOA: 02/26/2015 PCP: Kennon Portela, MD  Summary: 53 yom PMH CAD, hyperlipidemia presented with right facial droop, right-sided weakness arm and leg onset noticed 8 PM 9/1, last seen normal 1 PM 9/1. Per Dr. Nicole Kindred, neurology, tpa was not recommended. Admitted for cva work up.  Assessment/Plan: 1. Right facial droop, RUE/RLE weakness. Resolved. Suspect TIA. Not on anticoagulation or antiplatelet therapy. CT Head normal brain. LDL 134. MRI revealed no acute intracranial abnormalities. Carotid u/s and echo unremarkable. Risk factor: hyperlipidemia, smoker. Blood pressure normal.  2. Suspected TIA as above. 3. Hyperlipidemia. Start statin. 4. AKI vs CKD, suspect CKD stage II; last lab 2013 with normal creatinine. BUN normal on admission.  5. COPD, stable. 6. GERD, stable. 7. Hyperthyroidism.  8. Tobacco use disorder 1 ppd.  9. Asymptomatic bradycardia. Electrolytes normal. 10. Possible OSA.   Overall improved. Plan outpatient PT, await ST evaluation, failed swallowing screen.  Start daily ASA and statin therapy   Anticipate discharge this afternoon with outpatient f/u at [place] outpatient sleep study   Code Status: FULL DVT prophylaxis: SCDs Family Communication: Wife bedside.  Disposition Plan: Discharge home this afternoon  Murray Hodgkins, MD  [facility]  Pager [phone] If 7PM-7AM, please contact night-coverage at [email or web address], password [facility] 02/27/2015, 6:26 AM    Consultants:    Procedures:  ECHO - Left ventricle: The cavity size was normal. Wall thickness was normal. Systolic function was normal. The estimated ejection fraction was in the range of 55% to 60%. Wall motion was normal; there were no regional wall motion abnormalities. Left ventricular diastolic function parameters were normal. - Aortic valve: Valve area (VTI): 1.73 cm^2. Valve area (Vmax):  1.9 cm^2. - Atrial septum: No defect or patent foramen ovale was identified. - Technically adequate study.  Antibiotics:    HPI/Subjective: Feeling ok. No paraesthesias. Ambulating without difficulty. Some residual RUE weakness. Denies having trouble walking and talking, nausea, vomiting and SOB. Wife confirms that right facial droop has resolved.  Wife reports yesterday when she saw him he had facial drooping and noticeable limp when walking.   Objective: Filed Vitals:   02/27/15 0015 02/27/15 0045 02/27/15 0245 02/27/15 0445  BP: 109/76 112/77 111/74 97/65  Pulse: 47 80 60 50  Temp:  98 F (36.7 C) 98 F (36.7 C) 98 F (36.7 C)  TempSrc:  Oral Oral   Resp: 12 14 16 18   Height:  5\' 9"  (1.753 m)    Weight:  82.9 kg (182 lb 12.2 oz)    SpO2: 100% 100% 100% 99%   No intake or output data in the 24 hours ending 02/27/15 0626   Filed Weights   02/27/15 0045  Weight: 82.9 kg (182 lb 12.2 oz)    Exam: Afebrile, VSS, Not hypoxic General:  Appears comfortable, calm. Eyes: PERRL, normal lids, irises ENT: grossly normal hearing, lips, tongue Neck: no LAD, masses, thyromegaly Cardiovascular: Regular rate and rhythm, no murmur, rub or gallop. No lower extremity edema. Telemetry: SR, SB, intermittent bigeminy  Respiratory: Clear to auscultation bilaterally, no wheezes, rales or rhonchi. Normal respiratory effort. Abdomen: soft, ntnd Musculoskeletal:  BUE 5/5 symmetric, LLE 5/5, RLE 4+/5 Psychiatric: grossly normal mood and affect, speech fluent and appropriate Neurologic: grossly non-focal. CN 2-12 intact normal finger to nose and heel to shin. No pronator drift  New data reviewed:  Elevated LDL 134  BUN 17, creatinine 1.25, improved  Magnesium WNL  Pertinent data since admission:  CT Head: normal brain  MRI Brain: No acute intracranial abnormalities   EKG independently reviewed: SR, suspected LVH, no acute changes. Repeat EKG SR/bigeminy.  Carotid U/S revealed  mild amount of plaque at the levl of both carotid bulbs and proximal internal carotid arteries. Estimate bilateral ICA stenoses are less than 50%  Pending data:    Scheduled Meds: .  stroke: mapping our early stages of recovery book   Does not apply Once  . aspirin  300 mg Rectal Daily   Or  . aspirin  325 mg Oral Daily  . methimazole  40 mg Oral Daily   Continuous Infusions:   Principal Problem:   CVA (cerebral infarction) Active Problems:   GERD (gastroesophageal reflux disease)   Chronic neck pain   Hyperthyroidism   COPD (chronic obstructive pulmonary disease)   I, Jessica D. Leonie Green, acting as scribe, recorded this note contemporaneously in the presence of Dr. Melene Plan. Sarajane Jews, M.D. on 02/27/2015 .  I have reviewed the above documentation for accuracy and completeness, and I agree with the above. Murray Hodgkins, MD

## 2015-02-27 NOTE — Progress Notes (Signed)
OT Screen  Patient Details Name: Gerald Salas. MRN: [id number] DOB: 1951-04-23   OT Screen:     Reason evaluation not completed: Pt awake and alert this am, wife in room. Pt reports symptoms have resolved this am. Pt demonstrates BUE range of motion WNL, strength 4+/5. Pt is at baseline with ADL tasks, no futher OT services necessary.   Guadelupe Sabin, OTR/L  [phone] 02/27/2015, 11:15 AM

## 2015-02-27 NOTE — Discharge Summary (Signed)
Physician Discharge Summary  Gerald Salas. XIP:[id number] DOB: 1950-08-24 DOA: 02/26/2015  PCP: [place] date: 02/26/2015 Discharge date: 02/27/2015  Recommendations for Outpatient Follow-up:   Plan outpatient PT.  Follow up with PCP for suspected TIA. Aspirin and statin started. Consider outpatient neurology follow-up in 2 months  Consider outpatient sleep study for suspected OSA.  Consider repeat BMP on follow-up, possible CKD stage II  Recommend smoking cessation    Follow-up Information    Follow up with [facility]. Schedule an appointment as soon as possible for a visit in 1 month.   Specialty:  General Practice   Contact information:   [place] [address] [phone]       Discharge Diagnoses:  1. Right facial droop, RUE/RLE weakness 2. Suspected TIA 3. Hyperlipidemia  4. Suspected CKD stage II 5. COPD  6. GERD 7. Hyperthyroidism  8. Tobacco use disorder  Discharge Condition: Improved  Disposition: Discharge home   Diet recommendation: Regular   Filed Weights   02/27/15 0045  Weight: 82.9 kg (182 lb 12.2 oz)    History of present illness:  19 yom PMH CAD, hyperlipidemia presented with right facial droop, right-sided weakness arm and leg onset noticed 8 PM 9/1, last seen normal 1 PM 9/1. Per Dr. Nicole Kindred, neurology, tpa was not recommended. Admitted for cva work up.  Hospital Course:  Right facial droop resolved spontaneously. RUE and RLE strength near baseline. Suspect TIA as MRI brain/MRA head, echo, and carotid US were all unremarkable. Assessed by PT, OT and ST. Outpatient  PT recommended, otherwise no follow-up needed. Plan start ASA and statin. Mild asymptomatic bradycardia intermittent noted.   Individual issues as below:  1. Right facial droop, RUE/RLE weakness. Resolved. Suspect TIA. Not on anticoagulation or antiplatelet therapy. CT Head normal brain. LDL 134. MRI revealed no acute  intracranial abnormalities. Carotid u/s and echo unremarkable. Risk factors: hyperlipidemia, smoker. Blood pressure normal.  2. Suspected TIA as above. 3. Hyperlipidemia. Start statin. 4. AKI vs CKD, suspect CKD stage II; last lab 2013 with normal creatinine. BUN normal on admission.  5. COPD, stable. 6. GERD, stable. 7. Hyperthyroidism.  8. Tobacco use disorder 1 ppd.  9. Asymptomatic bradycardia. Electrolytes normal. 10. Possible OSA.  Consultants: 11. None   Procedures:  ECHO - Left ventricle: The cavity size was normal. Wall thickness was normal. Systolic function was normal. The estimated ejection fraction was in the range of 55% to 60%. Wall motion was normal; there were no regional wall motion abnormalities. Left ventricular diastolic function parameters were normal. - Aortic valve: Valve area (VTI): 1.73 cm^2. Valve area (Vmax): 1.9 cm^2. - Atrial septum: No defect or patent foramen ovale was identified. - Technically adequate study.  Antibiotics:  None   Discharge Instructions Discharge Instructions    Activity as tolerated - No restrictions    Complete by:  As directed      Diet - low sodium heart healthy    Complete by:  As directed      Discharge instructions    Complete by:  As directed   Call your physician or seek immediate medical attention for weakness, numbness, pain or worsening of condition. Recommend a sleep study to assess for sleep apnea, can discuss with your regular doctor.            Current Discharge Medication List    START taking these medications   Details  aspirin 325 MG tablet Take  1 tablet (325 mg total) by mouth daily.    atorvastatin (LIPITOR) 40 MG tablet Take 1 tablet (40 mg total) by mouth daily at 6 PM. Qty: 30 tablet, Refills: 0      CONTINUE these medications which have NOT CHANGED   Details  methimazole (TAPAZOLE) 10 MG tablet Take 40 mg by mouth daily.    acetaminophen (TYLENOL) 500 MG tablet Take  1,000 mg by mouth every 6 (six) hours as needed. For pain        Allergies  Allergen Reactions  . Codeine Other (See Comments), Itching and Rash    Veins pop out real bad Veins pop out real bad  . Tylox [Oxycodone-Acetaminophen] Itching  . Oxycodone-Acetaminophen Itching    The results of significant diagnostics from this hospitalization (including imaging, microbiology, ancillary and laboratory) are listed below for reference.    Significant Diagnostic Studies: Ct Head Wo Contrast  02/26/2015   CLINICAL DATA:  Right-sided facial droop.  EXAM: CT HEAD WITHOUT CONTRAST  TECHNIQUE: Contiguous axial images were obtained from the base of the skull through the vertex without intravenous contrast.  COMPARISON:  None  FINDINGS: There is no intracranial hemorrhage, mass or evidence of acute infarction. There is no extra-axial fluid collection. Gray matter and white matter appear normal. Cerebral volume is normal for age. Brainstem and posterior fossa are unremarkable. The CSF spaces appear normal.  The bony structures are intact. The visible portions of the paranasal sinuses are clear.  IMPRESSION: Normal brain   Electronically Signed   By: Andreas Newport M.D.   On: 02/26/2015 21:53   Mr [name] Head Wo Contrast  02/27/2015   CLINICAL DATA:  Right-sided weakness.  EXAM: MRI HEAD WITHOUT CONTRAST  MRA HEAD WITHOUT CONTRAST  TECHNIQUE: Multiplanar, multiecho pulse sequences of the brain and surrounding structures were obtained without intravenous contrast. Angiographic images of the head were obtained using MRA technique without contrast.  COMPARISON:  Head CT 02/26/2015  FINDINGS: MRI HEAD FINDINGS  There is no evidence of acute infarct, intracranial hemorrhage, mass, midline shift, or extra-axial fluid collection. Ventricles and sulci are within normal limits. Scattered, punctate foci of T2 hyperintensity in the predominantly subcortical cerebral white matter bilaterally are nonspecific but compatible with  minimal chronic small vessel ischemic disease.  Orbits are unremarkable. Mild mucosal thickening is present in the ethmoid air cells and maxillary sinuses bilaterally. There are trace right and small left mastoid effusions. Major intracranial vascular flow voids are preserved.  MRA HEAD FINDINGS  The visualized distal vertebral arteries are patent and codominant without stenosis. Right PICA origin is patent. Left PICA is not clearly identified. Left AICA is dominant. A persistent left-sided trigeminal artery is noted. SCA origins are patent. Basilar artery is patent without stenosis. There is a fetal origin of the right PCA. There is mild distal left PCA branch vessel attenuation without proximal PCA stenosis.  Internal carotid arteries are patent from skullbase to carotid termini without stenosis. ACAs and MCAs are unremarkable. No intracranial aneurysm is identified.  IMPRESSION: 1. No acute intracranial abnormality. 2. Minimal chronic small vessel ischemic disease. 3. Normal variant head MRA without large vessel occlusion or significant stenosis.   Electronically Signed   By: Logan Bores M.D.   On: 02/27/2015 11:36   Mr Brain Wo Contrast  02/27/2015   CLINICAL DATA:  Right-sided weakness.  EXAM: MRI HEAD WITHOUT CONTRAST  MRA HEAD WITHOUT CONTRAST  TECHNIQUE: Multiplanar, multiecho pulse sequences of the brain and surrounding structures were obtained without  intravenous contrast. Angiographic images of the head were obtained using MRA technique without contrast.  COMPARISON:  Head CT 02/26/2015  FINDINGS: MRI HEAD FINDINGS  There is no evidence of acute infarct, intracranial hemorrhage, mass, midline shift, or extra-axial fluid collection. Ventricles and sulci are within normal limits. Scattered, punctate foci of T2 hyperintensity in the predominantly subcortical cerebral white matter bilaterally are nonspecific but compatible with minimal chronic small vessel ischemic disease.  Orbits are unremarkable. Mild  mucosal thickening is present in the ethmoid air cells and maxillary sinuses bilaterally. There are trace right and small left mastoid effusions. Major intracranial vascular flow voids are preserved.  MRA HEAD FINDINGS  The visualized distal vertebral arteries are patent and codominant without stenosis. Right PICA origin is patent. Left PICA is not clearly identified. Left AICA is dominant. A persistent left-sided trigeminal artery is noted. SCA origins are patent. Basilar artery is patent without stenosis. There is a fetal origin of the right PCA. There is mild distal left PCA branch vessel attenuation without proximal PCA stenosis.  Internal carotid arteries are patent from skullbase to carotid termini without stenosis. ACAs and MCAs are unremarkable. No intracranial aneurysm is identified.  IMPRESSION: 1. No acute intracranial abnormality. 2. Minimal chronic small vessel ischemic disease. 3. Normal variant head MRA without large vessel occlusion or significant stenosis.   Electronically Signed   By: Logan Bores M.D.   On: 02/27/2015 11:36   US Carotid Bilateral  02/27/2015   CLINICAL DATA:  CVA, hyperlipidemia and current smoker.  EXAM: BILATERAL CAROTID DUPLEX ULTRASOUND  TECHNIQUE: Pearline Cables scale imaging, color Doppler and duplex ultrasound were performed of bilateral carotid and vertebral arteries in the neck.  COMPARISON:  None.  FINDINGS: Criteria: Quantification of carotid stenosis is based on velocity parameters that correlate the residual internal carotid diameter with NASCET-based stenosis levels, using the diameter of the distal internal carotid lumen as the denominator for stenosis measurement.  The following velocity measurements were obtained:  RIGHT  ICA:  66/19 cm/sec  CCA:  03/00 cm/sec  SYSTOLIC ICA/CCA RATIO:  1.0  DIASTOLIC ICA/CCA RATIO:  1.0  ECA:  98 cm/sec  LEFT  ICA:  65/19 cm/sec  CCA:  92/33 cm/sec  SYSTOLIC ICA/CCA RATIO:  1.1  DIASTOLIC ICA/CCA RATIO:  1.1  ECA:  67 cm/sec  RIGHT  CAROTID ARTERY: There is a mild amount of partially calcified plaque at the level of the carotid bulb and proximal internal carotid artery. Velocities and waveforms are within normal limits. Estimated right ICA stenosis is less than 50%.  RIGHT VERTEBRAL ARTERY: Antegrade flow with normal waveform and velocity.  LEFT CAROTID ARTERY: There is a mild amount of partially calcified plaque at the level of the carotid bulb and proximal ICA. Velocities and waveforms are normal and estimated left ICA stenosis is less than 50%.  LEFT VERTEBRAL ARTERY: Antegrade flow with normal waveform and velocity.  IMPRESSION: Mild amount of plaque at the level of both carotid bulbs and proximal internal carotid arteries. Estimated bilateral ICA stenoses are less than 50%.   Electronically Signed   By: Aletta Edouard M.D.   On: 02/27/2015 13:06     Labs: Basic Metabolic Panel:  Recent Labs Lab 02/26/15 2120 02/26/15 2141 02/27/15 1303  NA 138 141 136  K 3.8 3.8 3.7  CL 106 103 107  CO2 26  --  25  GLUCOSE 89 83 79  BUN 17 18 17   CREATININE 1.34* 1.40* 1.25*  CALCIUM 9.1  --  8.5*  MG  --   --  2.0   Liver Function Tests:  Recent Labs Lab 02/26/15 2120  AST 34  ALT 32  ALKPHOS 28*  BILITOT 0.4  PROT 7.8  ALBUMIN 4.5     Recent Labs Lab 02/26/15 2120 02/26/15 2141  WBC 9.3  --   NEUTROABS 5.1  --   HGB 16.4 16.3  HCT 46.4 48.0  MCV 98.5  --   PLT 169  --    CBG:  Recent Labs Lab 02/26/15 2140  GLUCAP 88    Principal Problem:   TIA (transient ischemic attack) Active Problems:   GERD (gastroesophageal reflux disease)   Chronic neck pain   Hyperthyroidism   COPD (chronic obstructive pulmonary disease)   Time coordinating discharge: 25 minutes   Signed:  Murray Hodgkins, MD [facility] 02/27/2015, 12:17 PM   I, Laban Emperor. Leonie Green, acting as scribe, recorded this note contemporaneously in the presence of Dr. Melene Plan. Sarajane Jews, M.D. on 02/27/2015 .  I have reviewed  the above documentation for accuracy and completeness, and I agree with the above. Murray Hodgkins, MD

## 2015-02-27 NOTE — H&P (Signed)
PCP:   Kennon Portela, MD   Chief Complaint:  Right side weak and rt facial weak  HPI: 64 yo male was mowing his lawn today when he started having right sided numbness and weakness around 130pm.  He got off his mower and walked into the house, felt like his right arm was mainly numb but says he had no problem walking.  Decided to take a nap on the couch.  Woke up at 430pm and was still having right sided numbness and weakness in his arm.  Drove to his wifes work.  His wife noticed he was walking funny.  He denied any problem, but she noticed his face was drooping on the right so she made him come to the ED.  Since arrival his face in not drooping, he is complaining still of numbness and mild weakness to his right arm.  Neuro at [facility] cone dr Nicole Kindred called who did not recommend tpa.  Pt referred for admission for cva work up.  Review of Systems:  Positive and negative as per HPI otherwise all other systems are negative  Past Medical History: Past Medical History  Diagnosis Date  . High cholesterol   . COPD (chronic obstructive pulmonary disease)   . Angina   . GERD (gastroesophageal reflux disease)   . Headache(784.0)   . Arthritis   . Adenomatous colon polyp 10/2007  . Gallstones   . Thyroid disease    Past Surgical History  Procedure Laterality Date  . Bladder neck reconstruction    . Esophagogastroduodenoscopy  03/02/2011    Procedure: ESOPHAGOGASTRODUODENOSCOPY (EGD);  Surgeon: Jamesetta So;  Location: [facility];  Service: Gastroenterology;  Laterality: N/A;  . Cardiac catheterization    . Anterior cervical decomp/discectomy fusion  1994    S/P neck fracture  . Tonsillectomy and adenoidectomy  1960's  . Hand surgery  1980's    "got it crushed"  . Carpal tunnel release  1993    left  . Cholecystectomy  09/28/2011    Procedure: LAPAROSCOPIC CHOLECYSTECTOMY WITH INTRAOPERATIVE CHOLANGIOGRAM;  Surgeon: Shann Medal, MD;  Location: [place];  Service: General;   Laterality: N/A;    Medications: Prior to Admission medications   Medication Sig Start Date End Date Taking? Authorizing Provider  methimazole (TAPAZOLE) 10 MG tablet Take 40 mg by mouth daily.   Yes Historical Provider, MD  acetaminophen (TYLENOL) 500 MG tablet Take 1,000 mg by mouth every 6 (six) hours as needed. For pain     Historical Provider, MD    Allergies:   Allergies  Allergen Reactions  . Codeine Other (See Comments), Itching and Rash    Veins pop out real bad Veins pop out real bad  . Tylox [Oxycodone-Acetaminophen] Itching  . Oxycodone-Acetaminophen Itching    Social History:  reports that he has been smoking Cigarettes.  He has a 34 pack-year smoking history. He quit smokeless tobacco use about 44 years ago. His smokeless tobacco use included Chew. He reports that he does not drink alcohol or use illicit drugs.  Family History: Family History  Problem Relation Age of Onset  . Cancer Mother   . Malignant hyperthermia Mother   . Cancer Father     brain tumor/?prostate  . Malignant hyperthermia Father   . Cancer Sister   . Malignant hyperthermia Sister   . Cancer Son     breast  . Cancer Paternal Aunt     breast    Physical Exam: Filed Vitals:   02/26/15  2345 02/27/15 0000 02/27/15 0015 02/27/15 0045  BP: 124/73 115/70 109/76 112/77  Pulse: 50 52 47 80  Resp: 18 13 12 14   Height:    5\' 9"  (1.753 m)  SpO2: 100% 99% 100% 100%   General appearance: alert, cooperative and no distress Head: Normocephalic, without obvious abnormality, atraumatic Eyes: negative Nose: Nares normal. Septum midline. Mucosa normal. No drainage or sinus tenderness. Neck: no JVD and supple, symmetrical, trachea midline Lungs: clear to auscultation bilaterally Heart: regular rate and rhythm, S1, S2 normal, no murmur, click, rub or gallop Abdomen: soft, non-tender; bowel sounds normal; no masses,  no organomegaly Extremities: extremities normal, atraumatic, no cyanosis or  edema Pulses: 2+ and symmetric Skin: Skin color, texture, turgor normal. No rashes or lesions Neurologic: Mental status: Alert, oriented, thought content appropriate Cranial nerves: normal  4/5 strength rue nml all else    Labs on Admission:   Recent Labs  02/26/15 2120 02/26/15 2141  NA 138 141  K 3.8 3.8  CL 106 103  CO2 26  --   GLUCOSE 89 83  BUN 17 18  CREATININE 1.34* 1.40*  CALCIUM 9.1  --     Recent Labs  02/26/15 2120  AST 34  ALT 32  ALKPHOS 28*  BILITOT 0.4  PROT 7.8  ALBUMIN 4.5    Recent Labs  02/26/15 2120 02/26/15 2141  WBC 9.3  --   NEUTROABS 5.1  --   HGB 16.4 16.3  HCT 46.4 48.0  MCV 98.5  --   PLT 169  --     Radiological Exams on Admission: Ct Head Wo Contrast  02/26/2015   CLINICAL DATA:  Right-sided facial droop.  EXAM: CT HEAD WITHOUT CONTRAST  TECHNIQUE: Contiguous axial images were obtained from the base of the skull through the vertex without intravenous contrast.  COMPARISON:  None  FINDINGS: There is no intracranial hemorrhage, mass or evidence of acute infarction. There is no extra-axial fluid collection. Gray matter and white matter appear normal. Cerebral volume is normal for age. Brainstem and posterior fossa are unremarkable. The CSF spaces appear normal.  The bony structures are intact. The visible portions of the paranasal sinuses are clear.  IMPRESSION: Normal brain   Electronically Signed   By: Andreas Newport M.D.   On: 02/26/2015 21:53   ekg nsr reviewed by myself  Assessment/Plan  64 yo male with right sided numbness and rt facial drooping concerning for stroke  Principal Problem:   CVA (cerebral infarction)-  cva pathway.  Mri brain, cardiac echo and carotid dopplers.  Does not take daily aspirin, place on daily aspirin.  Obtain neurology consult in am.  Ck fasting lipids and hga1c. freq neuro checks.  Active Problems:   GERD (gastroesophageal reflux disease)- stable   Chronic neck pain-  apap prn    Hyperthyroidism- noted, check tsh   COPD (chronic obstructive pulmonary disease)- stable at this time  obs on tele.  Full code  [place] A 02/27/2015, 1:21 AM

## 2015-02-27 NOTE — Evaluation (Signed)
Clinical/Bedside Swallow Evaluation Patient Details  Name: Gerald Salas. MRN: [id number] Date of Birth: Dec 06, 1950  Today's Date: 02/27/2015 Time: SLP Start Time (ACUTE ONLY): 7829 SLP Stop Time (ACUTE ONLY): 1555 SLP Time Calculation (min) (ACUTE ONLY): 20 min  Past Medical History:  Past Medical History  Diagnosis Date  . High cholesterol   . COPD (chronic obstructive pulmonary disease)   . Angina   . GERD (gastroesophageal reflux disease)   . Headache(784.0)   . Arthritis   . Adenomatous colon polyp 10/2007  . Gallstones   . Hyperthyroidism    Past Surgical History:  Past Surgical History  Procedure Laterality Date  . Bladder neck reconstruction    . Esophagogastroduodenoscopy  03/02/2011    Procedure: ESOPHAGOGASTRODUODENOSCOPY (EGD);  Surgeon: Jamesetta So;  [facility];  Service: Gastroenterology;  Laterality: N/A;  . Cardiac catheterization    . Anterior cervical decomp/discectomy fusion  1994    S/P neck fracture  . Tonsillectomy and adenoidectomy  1960's  . Hand surgery  1980's    "got it crushed"  . Carpal tunnel release  1993    left  . Cholecystectomy  09/28/2011    Procedure: LAPAROSCOPIC CHOLECYSTECTOMY WITH INTRAOPERATIVE CHOLANGIOGRAM;  Surgeon: Shann Medal, MD;  Location: [place];  Service: General;  Laterality: N/A;   HPI:  64 yo male was mowing his lawn today when he started having right sided numbness and weakness around 130pm. He got off his mower and walked into the house, felt like his right arm was mainly numb but says he had no problem walking. Decided to take a nap on the couch. Woke up at 430pm and was still having right sided numbness and weakness in his arm. Drove to his wifes work. His wife noticed he was walking funny. He denied any problem, but she noticed his face was drooping on the right so she made him come to the ED. Since arrival his face in not drooping, he is complaining still of numbness and mild weakness to his  right arm.   Assessment / Plan / Recommendation Clinical Impression  The pt demonstrates no observed overt s/sx of dysphagia after referral to skilled ST d/t suspected TIA; although CT Head showed normal brain and MRI revealed no acute intracranial abnormalities. Pt was administered ice chips and thin liquids by means of spoon, cup and straw with no overt s/sx of aspiration. With regular and puree textures, the pt also demonstrated no overt s/sx of aspiration. The pt did report things sometimes "go down the wrong way". The SLP provided education of basic swallowing precautions. Recommend regular/thin diet and meds to be administered whole with thin liquids. Intermittent supervision recommended.    Aspiration Risk  Mild    Diet Recommendation Thin;Age appropriate regular solids   Medication Administration: Whole meds with liquid Compensations: Slow rate;Small sips/bites    Other  Recommendations Oral Care Recommendations: Oral care BID   Follow Up Recommendations       Frequency and Duration        Pertinent Vitals/Pain     SLP Swallow Goals     Swallow Study Prior Functional Status       General Date of Onset: 02/27/15 Other Pertinent Information: 64 yo male was mowing his lawn today when he started having right sided numbness and weakness around 130pm. He got off his mower and walked into the house, felt like his right arm was mainly numb but says he had no problem walking. Decided  to take a nap on the couch. Woke up at 430pm and was still having right sided numbness and weakness in his arm. Drove to his wifes work. His wife noticed he was walking funny. He denied any problem, but she noticed his face was drooping on the right so she made him come to the ED. Since arrival his face in not drooping, he is complaining still of numbness and mild weakness to his right arm. Type of Study: Bedside swallow evaluation Temperature Spikes Noted: No History of Recent Intubation:  No Behavior/Cognition: Alert;Cooperative Oral Cavity - Dentition: Edentulous Self-Feeding Abilities: Able to feed self Patient Positioning: Upright in bed Baseline Vocal Quality: Normal Volitional Cough: Strong Volitional Swallow: Able to elicit    Oral/Motor/Sensory Function Overall Oral Motor/Sensory Function: Appears within functional limits for tasks assessed Labial ROM: Within Functional Limits Lingual Symmetry: Within Functional Limits Lingual Strength: Within Functional Limits Lingual Sensation: Within Functional Limits Facial ROM: Within Functional Limits Facial Symmetry: Within Functional Limits Facial Strength: Within Functional Limits Facial Sensation: Within Functional Limits   Ice Chips Ice chips: Within functional limits Presentation: Self Fed;Spoon   Thin Liquid Thin Liquid: Within functional limits Presentation: Cup;Straw;Self Fed    Nectar Thick Nectar Thick Liquid: Not tested   Honey Thick Honey Thick Liquid: Not tested   Puree Puree: Within functional limits Presentation: Self Fed;Spoon   Solid    Jadira Nierman H. Roddie Mc, CCC-SLP Speech Language Pathologist Solid: Within functional limits Presentation: Self Fed       Wende Bushy 02/27/2015,4:11 PM

## 2015-02-27 NOTE — Progress Notes (Addendum)
Patient sinus brady in the 30s. Dr. Sarajane Jews notified. New order for EKG.

## 2015-02-27 NOTE — Care Management Note (Signed)
Case Management Note  Patient Details  Name: Gerald Salas. MRN: [id number] Date of Birth: 1951/06/15  Expected Discharge Date:  03/02/15               Expected Discharge Plan:  Home/Self Care  In-House Referral:  NA  Discharge planning Services  CM Consult  Post Acute Care Choice:  NA Choice offered to:  NA  DME Arranged:    DME Agency:     HH Arranged:    HH Agency:     Status of Service:  Completed, signed off  Medicare Important Message Given:    Date Medicare IM Given:    Medicare IM give by:    Date Additional Medicare IM Given:    Additional Medicare Important Message give by:     If discussed at [place] of Stay Meetings, dates discussed:    Additional Comments: Pt is from home, lives with wife and is independent with ADL's. Pt admitted OBS for CVA workup. Pt plans to discharge home with self care and OP PT follow up today. Pt has chosen [facility] for PT services. Referral has been faxed and they will contact pt via phone after DC. No further CM needs.  Sherald Barge, RN 02/27/2015, 1:20 PM

## 2015-02-27 NOTE — Progress Notes (Signed)
**Note De-Identified Hallelujah Wysong Obfuscation** EKG done and reported to RN.  Patients wife told me that her husband is sometimes hard to wake-up, snores and wants to sleep all day.  She sometimes can not sleep herself for having to wake him up from pauses in his breathing.  She has not discussed this with the MD

## 2015-02-27 NOTE — Evaluation (Signed)
Physical Therapy Evaluation Patient Details Name: Gerald C Rashad Jr. MRN: [id number] DOB: 12/06/1950 Today's Date: 02/27/2015   History of Present Illness  63 yo male was mowing his lawn today when he started having right sided numbness and weakness around 130pm. He got off his mower and walked into the house, felt like his right arm was mainly numb but says he had no problem walking. Decided to take a nap on the couch. Woke up at 430pm and was still having right sided numbness and weakness in his arm. Drove to his wifes work. His wife noticed he was walking funny. He denied any problem, but she noticed his face was drooping on the right so she made him come to the ED. Since arrival his face in not drooping, he is complaining still of numbness and mild weakness to his right arm. Neuro at [place] dr stewart called who did not recommend tpa. Pt referred for admission for cva work up.  Clinical Impression  Pt has some high balance deficits but is safe for ambulation in hospital and home.  Therapist recommends out-patient PT to work on high balance activities to return to prior functional status.  Follow Up Recommendations Outpatient PT    Equipment Recommendations  None recommended by PT    Recommendations for Other Services   none    Precautions / Restrictions Precautions Precautions: None Restrictions Weight Bearing Restrictions: No      Mobility  Bed Mobility Overal bed mobility: Independent                Transfers Overall transfer level: Independent                  Ambulation/Gait Ambulation/Gait assistance: Independent Ambulation Distance (Feet): 150 Feet Assistive device: None Gait Pattern/deviations: WFL(Within Functional Limits)   Gait velocity interpretation: at or above normal speed for age/gender    Stairs            Wheelchair Mobility    Modified Rankin (Stroke Patients Only)       Balance   Loses balance with quick  turns.  Slight deviation to right when walking                                          Pertinent Vitals/Pain Pain Assessment: No/denies pain    Home Living                        Prior Function Level of Independence: Independent               Hand Dominance   Dominant Hand: Right    Extremity/Trunk Assessment               Lower Extremity Assessment: Overall WFL for tasks assessed         Communication   Communication: No difficulties  Cognition Arousal/Alertness: Awake/alert Behavior During Therapy: WFL for tasks assessed/performed Overall Cognitive Status: Within Functional Limits for tasks assessed                               Assessment/Plan    PT Assessment All further PT needs can be met in the next venue of care  PT Diagnosis     PT Problem List Decreased balance  PT Treatment Interventions       PT Goals (Current goals can be found in the Care Plan section)                 End of Session Equipment Utilized During Treatment: Gait belt Activity Tolerance: Patient tolerated treatment well Patient left: in chair;with call bell/phone within reach;with family/visitor present      Functional Limitation: Mobility: Walking and moving around Mobility: Walking and Moving Around Current Status (D3220): At least 1 percent but less than 20 percent impaired, limited or restricted Mobility: Walking and Moving Around Goal Status [phone]): At least 1 percent but less than 20 percent impaired, limited or restricted Mobility: Walking and Moving Around Discharge Status [phone]): At least 1 percent but less than 20 percent impaired, limited or restricted    Time: 0818-0839 PT Time Calculation (min) (ACUTE ONLY): 21 min   Charges:   PT Evaluation $Initial PT Evaluation Tier I: 1 Procedure     PT G Codes:   PT G-Codes **NOT FOR INPATIENT CLASS** Functional Limitation: Mobility: Walking and moving around Mobility:  Walking and Moving Around Current Status (S2831): At least 1 percent but less than 20 percent impaired, limited or restricted Mobility: Walking and Moving Around Goal Status [phone]): At least 1 percent but less than 20 percent impaired, limited or restricted Mobility: Walking and Moving Around Discharge Status [phone]): At least 1 percent but less than 20 percent impaired, limited or restricted    Yareni Creps,CINDY 02/27/2015, 8:39 AM

## 2015-02-28 LAB — HEMOGLOBIN A1C
HEMOGLOBIN A1C: 5.6 % (ref 4.8–5.6)
MEAN PLASMA GLUCOSE: 114 mg/dL

## 2015-12-04 ENCOUNTER — Emergency Department (HOSPITAL_COMMUNITY): Payer: Medicare Other

## 2015-12-04 ENCOUNTER — Emergency Department (HOSPITAL_COMMUNITY)
Admission: EM | Admit: 2015-12-04 | Discharge: 2015-12-04 | Disposition: A | Discharge status: Home / Self Care | Payer: Medicare Other | Attending: Emergency Medicine | Admitting: Emergency Medicine

## 2015-12-04 ENCOUNTER — Encounter (HOSPITAL_COMMUNITY): Payer: Self-pay | Admitting: *Deleted

## 2015-12-04 DIAGNOSIS — J029 Acute pharyngitis, unspecified: Secondary | ICD-10-CM

## 2015-12-04 DIAGNOSIS — E7800 Pure hypercholesterolemia, unspecified: Secondary | ICD-10-CM | POA: Diagnosis not present

## 2015-12-04 DIAGNOSIS — J449 Chronic obstructive pulmonary disease, unspecified: Secondary | ICD-10-CM | POA: Insufficient documentation

## 2015-12-04 DIAGNOSIS — J069 Acute upper respiratory infection, unspecified: Secondary | ICD-10-CM | POA: Diagnosis not present

## 2015-12-04 DIAGNOSIS — R911 Solitary pulmonary nodule: Secondary | ICD-10-CM | POA: Diagnosis not present

## 2015-12-04 DIAGNOSIS — F17210 Nicotine dependence, cigarettes, uncomplicated: Secondary | ICD-10-CM | POA: Insufficient documentation

## 2015-12-04 DIAGNOSIS — R918 Other nonspecific abnormal finding of lung field: Secondary | ICD-10-CM | POA: Insufficient documentation

## 2015-12-04 DIAGNOSIS — R05 Cough: Secondary | ICD-10-CM | POA: Diagnosis not present

## 2015-12-04 LAB — BASIC METABOLIC PANEL
ANION GAP: 5 (ref 5–15)
BUN: 12 mg/dL (ref 6–20)
CHLORIDE: 108 mmol/L (ref 101–111)
CO2: 26 mmol/L (ref 22–32)
CREATININE: 1.22 mg/dL (ref 0.61–1.24)
Calcium: 9.1 mg/dL (ref 8.9–10.3)
GFR calc non Af Amer: >60 mL/min (ref >60)
Glucose, Bld: 115 mg/dL — ABNORMAL HIGH (ref 65–99)
POTASSIUM: 3.6 mmol/L (ref 3.5–5.1)
Sodium: 139 mmol/L (ref 135–145)

## 2015-12-04 LAB — RAPID STREP SCREEN (MED CTR MEBANE ONLY): Streptococcus, Group A Screen (Direct): NEGATIVE

## 2015-12-04 MED ORDER — DOXYCYCLINE HYCLATE 100 MG PO CAPS: 100.0000 mg | ORAL_CAPSULE | Freq: Two times a day (BID) | ORAL | Status: DC

## 2015-12-04 MED ORDER — DOXYCYCLINE HYCLATE 100 MG PO TABS
100.0000 mg | ORAL_TABLET | Freq: Once | ORAL | Status: AC
  Administered 2015-12-04: 100 mg via ORAL
  Filled 2015-12-04: qty 1

## 2015-12-04 MED ORDER — IOPAMIDOL (ISOVUE-300) INJECTION 61%
100.0000 mL | Freq: Once | INTRAVENOUS | Status: AC | PRN
  Administered 2015-12-04: 100 mL via INTRAVENOUS

## 2015-12-04 MED ORDER — ACETAMINOPHEN 500 MG PO TABS
1000.0000 mg | ORAL_TABLET | Freq: Once | ORAL | Status: AC
  Administered 2015-12-04: 1000 mg via ORAL
  Filled 2015-12-04: qty 2

## 2015-12-04 NOTE — ED Provider Notes (Signed)
CSN: [id number]     Arrival date & time 12/04/15  1900 History   First MD Initiated Contact with Patient 12/04/15 1926     Chief Complaint  Patient presents with  . Sore Throat     (Consider location/radiation/quality/duration/timing/severity/associated sxs/prior Treatment) HPI Comments: Patient is a 65 year old male who presents to the emergency department with a complaint of sore throat and cough. The patient states that approximately 8:30 this morning he began to have problem with some sore throat. He was able to swallow. He is able to get liquids down on. He complains of pain however when he swallows. He denies any high fever to be reported. It is also of note that he has had cough, and over the last 3 days has been coughing up greenish looking phlegm. Patient is a smoker. The patient is concerned about his sore throat because he has a history of hyperthyroidism. He said he was told by his doctors that if he developed a sore throat to see his doctor or to see the emergency department. He is also concerned because he recently had a change in his thyroid medicine of Tapazole to 5 mg daily.  Patient is a 65 y.o. male presenting with pharyngitis. The history is provided by the patient.  Sore Throat This is a new problem. The current episode started today. Associated symptoms include arthralgias, congestion, coughing, headaches and a sore throat.    Past Medical History  Diagnosis Date  . High cholesterol   . COPD (chronic obstructive pulmonary disease) ([place])   . Angina   . GERD (gastroesophageal reflux disease)   . Headache(784.0)   . Arthritis   . Adenomatous colon polyp 10/2007  . Gallstones   . Hyperthyroidism    Past Surgical History  Procedure Laterality Date  . Bladder neck reconstruction    . Esophagogastroduodenoscopy  03/02/2011    Procedure: ESOPHAGOGASTRODUODENOSCOPY (EGD);  Surgeon: Jamesetta So;  Location: [facility];  Service: Gastroenterology;  Laterality: N/A;  .  Cardiac catheterization    . Anterior cervical decomp/discectomy fusion  1994    S/P neck fracture  . Tonsillectomy and adenoidectomy  1960's  . Hand surgery  1980's    "got it crushed"  . Carpal tunnel release  1993    left  . Cholecystectomy  09/28/2011    Procedure: LAPAROSCOPIC CHOLECYSTECTOMY WITH INTRAOPERATIVE CHOLANGIOGRAM;  Surgeon: Shann Medal, MD;  Location: [facility] OR;  Service: General;  Laterality: N/A;   Family History  Problem Relation Age of Onset  . Cancer Mother   . Malignant hyperthermia Mother   . Cancer Father     brain tumor/?prostate  . Malignant hyperthermia Father   . Cancer Sister   . Malignant hyperthermia Sister   . Cancer Son     breast  . Cancer Paternal Aunt     breast   Social History  Substance Use Topics  . Smoking status: Current Every Day Smoker -- 1.00 packs/day for 34 years    Types: Cigarettes  . Smokeless tobacco: Former Systems developer    Types: Stallings date: 06/27/1970     Comment: Counseling sheet given 10-2011   . Alcohol Use: No    Review of Systems  HENT: Positive for congestion, postnasal drip and sore throat.   Respiratory: Positive for cough.   Musculoskeletal: Positive for arthralgias.  Neurological: Positive for headaches.  All other systems reviewed and are negative.     Allergies  Codeine; Tylox; and Oxycodone-acetaminophen  Home  Medications   Prior to Admission medications   Medication Sig Start Date End Date Taking? Authorizing Provider  acetaminophen (TYLENOL) 500 MG tablet Take 1,000 mg by mouth every 6 (six) hours as needed. For pain     Historical Provider, MD  aspirin 325 MG tablet Take 1 tablet (325 mg total) by mouth daily. 02/27/15   Samuella Cota, MD  atorvastatin (LIPITOR) 40 MG tablet Take 1 tablet (40 mg total) by mouth daily at 6 PM. 02/27/15   Samuella Cota, MD  methimazole (TAPAZOLE) 10 MG tablet Take 5 mg by mouth daily.     Historical Provider, MD   BP 126/71 mmHg  Pulse 67  Temp(Src) 97.5 F  (36.4 C) (Oral)  Resp 20  Ht 5\' 11"  (1.803 m)  Wt 89.5 kg  BMI 27.53 kg/m2  SpO2 100% Physical Exam  Constitutional: He is oriented to person, place, and time. He appears well-developed and well-nourished.  Non-toxic appearance.  HENT:  Head: Normocephalic.  Right Ear: Tympanic membrane and external ear normal.  Left Ear: Tympanic membrane and external ear normal.  Nasal congestion present. Moderate increased redness of the posterior pharynx. The uvula is in the midline, but has some swelling present. The speech is understandable. The airway is patent.  Eyes: EOM and lids are normal. Pupils are equal, round, and reactive to light.  Neck: Normal range of motion. Neck supple. Carotid bruit is not present. No thyromegaly present.  Cardiovascular: Normal rate, regular rhythm, normal heart sounds, intact distal pulses and normal pulses.   Pulmonary/Chest: No respiratory distress. He has rhonchi.  Abdominal: Soft. Bowel sounds are normal. There is no tenderness. There is no guarding.  Musculoskeletal: Normal range of motion.  Lymphadenopathy:       Head (right side): No submandibular adenopathy present.       Head (left side): No submandibular adenopathy present.    He has no cervical adenopathy.  Neurological: He is alert and oriented to person, place, and time. He has normal strength. No cranial nerve deficit or sensory deficit.  Skin: Skin is warm and dry.  Psychiatric: He has a normal mood and affect. His speech is normal.  Nursing note and vitals reviewed.   ED Course  Procedures (including critical care time) Labs Review Labs Reviewed  BASIC METABOLIC PANEL - Abnormal; Notable for the following:    Glucose, Bld 115 (*)    All other components within normal limits  RAPID STREP SCREEN (NOT AT [facility])  CULTURE, GROUP A STREP [facility])    Imaging Review Dg Chest 2 View  12/04/2015  CLINICAL DATA:  Sore throat since this morning.  Cough. EXAM: CHEST  2 VIEW COMPARISON:  02/19/2011.   02/17/2011. FINDINGS: Heart size is normal. Mediastinal shadows are normal. Interstitial pulmonary markings are slightly prominent diffusely. Question 1 cm mass or nodule in the left upper lobe. Chest CT with contrast suggested to evaluate this further. There is biapical pleural thickening. No effusions. No acute bone findings. IMPRESSION: Suspicion of 1 cm mass/nodule left upper lobe.  Chest CT suggested. Background pattern of prominent diffuse interstitial markings likely relating to chronic lung disease. Electronically Signed   By: Nelson Chimes M.D.   On: 12/04/2015 19:59   I have personally reviewed and evaluated these images and lab results as part of my medical decision-making.   EKG Interpretation None      MDM  Strep test is negative. Chest x-ray shows some bronchitic changes on. There is also suspicion of a  1 cm mass/nodule in the left upper lobe of the lung. A CT scan with contrast has been suggested. I discussed this with the patient, and he would like to proceed with the CT scan.  Basic metabolic panel is well within normal limits.  CT scan shows small scattered pulmonary nodules measuring up to 5 mm in the left lower lobe on. It was felt that the nodular density seen on the chest x-ray probably corresponded to a subpleural fat protruding area of the left major fissure.  I discussed the findings with the patient in terms which he understands. The patient received a copy of the CT report. [facility] We discussed the need to stop smoking. The patient is going to be treated with doxycycline because of a sinusitis and otherwise clinically a bronchitis. We discussed increasing fluids. The patient will see his physicians at the [facility], or return to the emergency department if any changes, problems, or concerns.    Final diagnoses:  Pharyngitis  URI (upper respiratory infection)  Multiple lung  nodules on CT    *I have reviewed nursing notes, vital signs, and all appropriate lab and imaging results for this patient.[address], PA-C 12/04/15 2238  Nat Christen, MD 12/05/15 [phone]

## 2015-12-04 NOTE — ED Notes (Signed)
Pt c/o sore throat since 8:30 am this morning and a cough in which he is coughing up green phlegm.

## 2015-12-04 NOTE — Discharge Instructions (Signed)
Your strep test is negative. The CT scan of your chest reveals multiple nodules on throughout the lung areas. At this time they appear benign. Please see your physicians at the [facility] so that they might follow-up on this report. Please stop smoking. Use doxycycline 2 times daily for your sinus infection and what appears to be some bronchitis clinically. Use the decongesting medication of your choice for your nasal decongestion. Please increase fluids to thin the secretions. See your [facility] physician, or return to the emergency department if any changes, problems, or concerns. Upper Respiratory Infection, Adult Most upper respiratory infections (URIs) are caused by a virus. A URI affects the nose, throat, and upper air passages. The most common type of URI is often called "the common cold." HOME CARE   Take medicines only as told by your doctor.  Gargle warm saltwater or take cough drops to comfort your throat as told by your doctor.  Use a warm mist humidifier or inhale steam from a shower to increase air moisture. This may make it easier to breathe.  Drink enough fluid to keep your pee (urine) clear or pale yellow.  Eat soups and other clear broths.  Have a healthy diet.  Rest as needed.  Go back to work when your fever is gone or your doctor says it is okay.  You may need to stay home longer to avoid giving your URI to others.  You can also wear a face mask and wash your hands often to prevent spread of the virus.  Use your inhaler more if you have asthma.  Do not use any tobacco products, including cigarettes, chewing tobacco, or electronic cigarettes. If you need help quitting, ask your doctor. GET HELP IF:  You are getting worse, not better.  Your symptoms are not helped by medicine.  You have chills.  You are getting more short of breath.  You have brown or red mucus.  You have yellow or brown discharge from your nose.  You  have pain in your face, especially when you bend forward.  You have a fever.  You have puffy (swollen) neck glands.  You have pain while swallowing.  You have white areas in the back of your throat. GET HELP RIGHT AWAY IF:   You have very bad or constant:  Headache.  Ear pain.  Pain in your forehead, behind your eyes, and over your cheekbones (sinus pain).  Chest pain.  You have long-lasting (chronic) lung disease and any of the following:  Wheezing.  Long-lasting cough.  Coughing up blood.  A change in your usual mucus.  You have a stiff neck.  You have changes in your:  Vision.  Hearing.  Thinking.  Mood. MAKE SURE YOU:   Understand these instructions.  Will watch your condition.  Will get help right away if you are not doing well or get worse.   This information is not intended to replace advice given to you by your health care provider. Make sure you discuss any questions you have with your health care provider.   Document Released: 11/30/2007 Document Revised: 10/28/2014 Document Reviewed: 09/18/2013 Elsevier Interactive Patient Education 2016 Elsevier Inc.  Pulmonary Nodule  A pulmonary nodule is a small, round spot in your lung. It is usually found when pictures of your lungs are taken for other reasons. Most pulmonary nodules are not cancerous and do not cause symptoms. Tests will be done to make sure the nodule is not cancerous. Pulmonary nodules that  are not cancerous usually do not require treatment. HOME CARE   Only take medicine as told by your doctor.  Follow up with your doctor as told. GET HELP IF:  You have trouble breathing when doing activities.  You feel sick or more tired than normal.  You do not feel like eating.  You lose weight without trying to.  You have chills.  You have night sweats. GET HELP RIGHT AWAY IF:  You cannot catch your breath.  You start making whistling sounds when breathing (wheezing).  You have  a cough that does not go away.  You cough up blood.  You are dizzy or feel like you are going to pass out.  You have sudden chest pain.  You have a fever or lasting symptoms for more than 2-3 days.  You have a fever and your symptoms suddenly get worse. MAKE SURE YOU:  Understand these instructions.  Will watch your condition.  Will get help right away if you are not doing well or get worse.   This information is not intended to replace advice given to you by your health care provider. Make sure you discuss any questions you have with your health care provider.   Document Released: 07/16/2010 Document Revised: 10/28/2014 Document Reviewed: 12/03/2012 Elsevier Interactive Patient Education [employer].

## 2015-12-07 LAB — CULTURE, GROUP A STREP (THRC)

## 2016-05-30 DIAGNOSIS — Z9889 Other specified postprocedural states: Secondary | ICD-10-CM | POA: Diagnosis not present

## 2016-05-30 DIAGNOSIS — Z9049 Acquired absence of other specified parts of digestive tract: Secondary | ICD-10-CM | POA: Diagnosis not present

## 2016-05-30 DIAGNOSIS — Z79899 Other long term (current) drug therapy: Secondary | ICD-10-CM | POA: Diagnosis not present

## 2016-05-30 DIAGNOSIS — H01002 Unspecified blepharitis right lower eyelid: Secondary | ICD-10-CM | POA: Diagnosis not present

## 2016-05-30 DIAGNOSIS — H01004 Unspecified blepharitis left upper eyelid: Secondary | ICD-10-CM | POA: Diagnosis not present

## 2016-05-30 DIAGNOSIS — Z9089 Acquired absence of other organs: Secondary | ICD-10-CM | POA: Diagnosis not present

## 2016-05-30 DIAGNOSIS — H01001 Unspecified blepharitis right upper eyelid: Secondary | ICD-10-CM | POA: Diagnosis not present

## 2016-05-30 DIAGNOSIS — Z7982 Long term (current) use of aspirin: Secondary | ICD-10-CM | POA: Diagnosis not present

## 2016-05-30 DIAGNOSIS — H2513 Age-related nuclear cataract, bilateral: Secondary | ICD-10-CM | POA: Diagnosis not present

## 2016-05-30 DIAGNOSIS — Z885 Allergy status to narcotic agent status: Secondary | ICD-10-CM | POA: Diagnosis not present

## 2016-05-30 DIAGNOSIS — H02403 Unspecified ptosis of bilateral eyelids: Secondary | ICD-10-CM | POA: Diagnosis not present

## 2016-05-30 DIAGNOSIS — Z83518 Family history of other specified eye disorder: Secondary | ICD-10-CM | POA: Diagnosis not present

## 2016-05-30 DIAGNOSIS — H01005 Unspecified blepharitis left lower eyelid: Secondary | ICD-10-CM | POA: Diagnosis not present

## 2016-05-30 DIAGNOSIS — H04123 Dry eye syndrome of bilateral lacrimal glands: Secondary | ICD-10-CM | POA: Diagnosis not present

## 2016-05-30 DIAGNOSIS — E0500 Thyrotoxicosis with diffuse goiter without thyrotoxic crisis or storm: Secondary | ICD-10-CM | POA: Diagnosis not present

## 2016-06-09 DIAGNOSIS — Z888 Allergy status to other drugs, medicaments and biological substances status: Secondary | ICD-10-CM | POA: Diagnosis not present

## 2016-06-09 DIAGNOSIS — E7800 Pure hypercholesterolemia, unspecified: Secondary | ICD-10-CM | POA: Diagnosis not present

## 2016-06-09 DIAGNOSIS — H02423 Myogenic ptosis of bilateral eyelids: Secondary | ICD-10-CM | POA: Diagnosis not present

## 2016-06-09 DIAGNOSIS — H04123 Dry eye syndrome of bilateral lacrimal glands: Secondary | ICD-10-CM | POA: Diagnosis not present

## 2016-06-09 DIAGNOSIS — Z886 Allergy status to analgesic agent status: Secondary | ICD-10-CM | POA: Diagnosis not present

## 2016-06-09 DIAGNOSIS — Z885 Allergy status to narcotic agent status: Secondary | ICD-10-CM | POA: Diagnosis not present

## 2016-06-09 DIAGNOSIS — Z79899 Other long term (current) drug therapy: Secondary | ICD-10-CM | POA: Diagnosis not present

## 2016-06-09 DIAGNOSIS — Z7982 Long term (current) use of aspirin: Secondary | ICD-10-CM | POA: Diagnosis not present

## 2016-08-04 ENCOUNTER — Telehealth: Payer: Self-pay | Admitting: Physician Assistant

## 2016-08-04 NOTE — Telephone Encounter (Signed)
Fax from [facility] regarding patient's use of aspirin and upcoming levator resection, planned for 08/26/2016 with Dr. Kandis Ban.  ASA recommended due to patient's history of TIA. Last seen here 07/29/2011. Patient would need visit here for more specific recommendations. Form completed and placed in box for faxing.

## 2016-08-10 DIAGNOSIS — Z7689 Persons encountering health services in other specified circumstances: Secondary | ICD-10-CM | POA: Diagnosis not present

## 2016-08-10 DIAGNOSIS — E0500 Thyrotoxicosis with diffuse goiter without thyrotoxic crisis or storm: Secondary | ICD-10-CM | POA: Diagnosis not present

## 2016-08-10 DIAGNOSIS — Z538 Procedure and treatment not carried out for other reasons: Secondary | ICD-10-CM | POA: Diagnosis not present

## 2016-08-10 DIAGNOSIS — G459 Transient cerebral ischemic attack, unspecified: Secondary | ICD-10-CM | POA: Diagnosis not present

## 2016-10-14 DIAGNOSIS — H2513 Age-related nuclear cataract, bilateral: Secondary | ICD-10-CM | POA: Diagnosis not present

## 2016-10-14 DIAGNOSIS — H01001 Unspecified blepharitis right upper eyelid: Secondary | ICD-10-CM | POA: Diagnosis not present

## 2016-10-14 DIAGNOSIS — H02423 Myogenic ptosis of bilateral eyelids: Secondary | ICD-10-CM | POA: Diagnosis not present

## 2016-10-14 DIAGNOSIS — H01002 Unspecified blepharitis right lower eyelid: Secondary | ICD-10-CM | POA: Diagnosis not present

## 2016-10-14 DIAGNOSIS — E0500 Thyrotoxicosis with diffuse goiter without thyrotoxic crisis or storm: Secondary | ICD-10-CM | POA: Diagnosis not present

## 2016-10-14 DIAGNOSIS — H01005 Unspecified blepharitis left lower eyelid: Secondary | ICD-10-CM | POA: Diagnosis not present

## 2016-10-14 DIAGNOSIS — H04123 Dry eye syndrome of bilateral lacrimal glands: Secondary | ICD-10-CM | POA: Diagnosis not present

## 2016-10-14 DIAGNOSIS — H01004 Unspecified blepharitis left upper eyelid: Secondary | ICD-10-CM | POA: Diagnosis not present

## 2016-10-14 DIAGNOSIS — J449 Chronic obstructive pulmonary disease, unspecified: Secondary | ICD-10-CM | POA: Diagnosis not present

## 2017-01-09 DIAGNOSIS — H01001 Unspecified blepharitis right upper eyelid: Secondary | ICD-10-CM | POA: Diagnosis not present

## 2017-01-09 DIAGNOSIS — H01002 Unspecified blepharitis right lower eyelid: Secondary | ICD-10-CM | POA: Diagnosis not present

## 2017-01-09 DIAGNOSIS — Z9889 Other specified postprocedural states: Secondary | ICD-10-CM | POA: Diagnosis not present

## 2017-01-09 DIAGNOSIS — H0289 Other specified disorders of eyelid: Secondary | ICD-10-CM | POA: Diagnosis not present

## 2017-01-09 DIAGNOSIS — E0500 Thyrotoxicosis with diffuse goiter without thyrotoxic crisis or storm: Secondary | ICD-10-CM | POA: Diagnosis not present

## 2017-01-09 DIAGNOSIS — H04123 Dry eye syndrome of bilateral lacrimal glands: Secondary | ICD-10-CM | POA: Diagnosis not present

## 2017-01-09 DIAGNOSIS — H01005 Unspecified blepharitis left lower eyelid: Secondary | ICD-10-CM | POA: Diagnosis not present

## 2017-01-09 DIAGNOSIS — H02403 Unspecified ptosis of bilateral eyelids: Secondary | ICD-10-CM | POA: Diagnosis not present

## 2017-01-09 DIAGNOSIS — H01004 Unspecified blepharitis left upper eyelid: Secondary | ICD-10-CM | POA: Diagnosis not present

## 2017-02-08 DIAGNOSIS — H02423 Myogenic ptosis of bilateral eyelids: Secondary | ICD-10-CM | POA: Diagnosis not present

## 2017-02-08 DIAGNOSIS — H02101 Unspecified ectropion of right upper eyelid: Secondary | ICD-10-CM | POA: Diagnosis not present

## 2017-02-08 DIAGNOSIS — H02103 Unspecified ectropion of right eye, unspecified eyelid: Secondary | ICD-10-CM | POA: Diagnosis not present

## 2017-02-08 DIAGNOSIS — H2513 Age-related nuclear cataract, bilateral: Secondary | ICD-10-CM | POA: Diagnosis not present

## 2017-02-08 DIAGNOSIS — G510 Bell's palsy: Secondary | ICD-10-CM | POA: Diagnosis not present

## 2017-02-08 DIAGNOSIS — H02403 Unspecified ptosis of bilateral eyelids: Secondary | ICD-10-CM | POA: Diagnosis not present

## 2017-02-08 DIAGNOSIS — E0500 Thyrotoxicosis with diffuse goiter without thyrotoxic crisis or storm: Secondary | ICD-10-CM | POA: Diagnosis not present

## 2017-02-08 DIAGNOSIS — H5340 Unspecified visual field defects: Secondary | ICD-10-CM | POA: Diagnosis not present

## 2017-02-08 DIAGNOSIS — H04123 Dry eye syndrome of bilateral lacrimal glands: Secondary | ICD-10-CM | POA: Diagnosis not present

## 2017-02-08 DIAGNOSIS — H02831 Dermatochalasis of right upper eyelid: Secondary | ICD-10-CM | POA: Diagnosis not present

## 2017-02-08 DIAGNOSIS — H02834 Dermatochalasis of left upper eyelid: Secondary | ICD-10-CM | POA: Diagnosis not present

## 2017-02-08 DIAGNOSIS — H02104 Unspecified ectropion of left upper eyelid: Secondary | ICD-10-CM | POA: Diagnosis not present

## 2017-02-08 DIAGNOSIS — L918 Other hypertrophic disorders of the skin: Secondary | ICD-10-CM | POA: Diagnosis not present

## 2017-02-08 DIAGNOSIS — H02106 Unspecified ectropion of left eye, unspecified eyelid: Secondary | ICD-10-CM | POA: Diagnosis not present

## 2017-03-29 DIAGNOSIS — K219 Gastro-esophageal reflux disease without esophagitis: Secondary | ICD-10-CM | POA: Diagnosis not present

## 2017-03-29 DIAGNOSIS — J449 Chronic obstructive pulmonary disease, unspecified: Secondary | ICD-10-CM | POA: Diagnosis not present

## 2017-03-29 DIAGNOSIS — H02423 Myogenic ptosis of bilateral eyelids: Secondary | ICD-10-CM | POA: Diagnosis not present

## 2017-03-29 DIAGNOSIS — H02403 Unspecified ptosis of bilateral eyelids: Secondary | ICD-10-CM | POA: Diagnosis not present

## 2017-03-29 DIAGNOSIS — H5340 Unspecified visual field defects: Secondary | ICD-10-CM | POA: Diagnosis not present

## 2017-03-29 DIAGNOSIS — E0500 Thyrotoxicosis with diffuse goiter without thyrotoxic crisis or storm: Secondary | ICD-10-CM | POA: Diagnosis not present

## 2017-03-29 DIAGNOSIS — F17210 Nicotine dependence, cigarettes, uncomplicated: Secondary | ICD-10-CM | POA: Diagnosis not present

## 2017-03-29 DIAGNOSIS — L918 Other hypertrophic disorders of the skin: Secondary | ICD-10-CM | POA: Diagnosis not present

## 2017-03-29 DIAGNOSIS — H53483 Generalized contraction of visual field, bilateral: Secondary | ICD-10-CM | POA: Diagnosis not present

## 2017-03-29 DIAGNOSIS — M1990 Unspecified osteoarthritis, unspecified site: Secondary | ICD-10-CM | POA: Diagnosis not present

## 2017-03-29 DIAGNOSIS — E7800 Pure hypercholesterolemia, unspecified: Secondary | ICD-10-CM | POA: Diagnosis not present

## 2017-04-05 DIAGNOSIS — E0500 Thyrotoxicosis with diffuse goiter without thyrotoxic crisis or storm: Secondary | ICD-10-CM | POA: Diagnosis not present

## 2017-04-05 DIAGNOSIS — Z48810 Encounter for surgical aftercare following surgery on the sense organs: Secondary | ICD-10-CM | POA: Diagnosis not present

## 2017-04-05 DIAGNOSIS — H02103 Unspecified ectropion of right eye, unspecified eyelid: Secondary | ICD-10-CM | POA: Diagnosis not present

## 2017-04-05 DIAGNOSIS — E7800 Pure hypercholesterolemia, unspecified: Secondary | ICD-10-CM | POA: Diagnosis not present

## 2017-04-05 DIAGNOSIS — H02106 Unspecified ectropion of left eye, unspecified eyelid: Secondary | ICD-10-CM | POA: Diagnosis not present

## 2017-04-05 DIAGNOSIS — F17210 Nicotine dependence, cigarettes, uncomplicated: Secondary | ICD-10-CM | POA: Diagnosis not present

## 2017-04-05 DIAGNOSIS — H04123 Dry eye syndrome of bilateral lacrimal glands: Secondary | ICD-10-CM | POA: Diagnosis not present

## 2017-04-05 DIAGNOSIS — J449 Chronic obstructive pulmonary disease, unspecified: Secondary | ICD-10-CM | POA: Diagnosis not present

## 2017-04-05 DIAGNOSIS — Z7982 Long term (current) use of aspirin: Secondary | ICD-10-CM | POA: Diagnosis not present

## 2018-05-06 ENCOUNTER — Emergency Department (HOSPITAL_COMMUNITY)
Admission: EM | Admit: 2018-05-06 | Discharge: 2018-05-06 | Disposition: A | Discharge status: Home / Self Care | Payer: Medicare HMO | Attending: Emergency Medicine | Admitting: Emergency Medicine

## 2018-05-06 ENCOUNTER — Encounter (HOSPITAL_COMMUNITY): Payer: Self-pay | Admitting: Emergency Medicine

## 2018-05-06 ENCOUNTER — Other Ambulatory Visit: Payer: Self-pay

## 2018-05-06 DIAGNOSIS — L040 Acute lymphadenitis of face, head and neck: Secondary | ICD-10-CM | POA: Diagnosis not present

## 2018-05-06 DIAGNOSIS — J449 Chronic obstructive pulmonary disease, unspecified: Secondary | ICD-10-CM | POA: Diagnosis not present

## 2018-05-06 DIAGNOSIS — M542 Cervicalgia: Secondary | ICD-10-CM | POA: Diagnosis present

## 2018-05-06 DIAGNOSIS — L049 Acute lymphadenitis, unspecified: Secondary | ICD-10-CM | POA: Diagnosis not present

## 2018-05-06 DIAGNOSIS — Z8673 Personal history of transient ischemic attack (TIA), and cerebral infarction without residual deficits: Secondary | ICD-10-CM | POA: Diagnosis not present

## 2018-05-06 DIAGNOSIS — F17210 Nicotine dependence, cigarettes, uncomplicated: Secondary | ICD-10-CM | POA: Insufficient documentation

## 2018-05-06 MED ORDER — HYDROCODONE-ACETAMINOPHEN 5-325 MG PO TABS
1.0000 | ORAL_TABLET | Freq: Once | ORAL | Status: AC
  Administered 2018-05-06: 1 via ORAL
  Filled 2018-05-06: qty 1

## 2018-05-06 MED ORDER — IBUPROFEN 600 MG PO TABS: 600.0000 mg | ORAL_TABLET | Freq: Four times a day (QID) | ORAL | 0 refills | Status: DC | PRN

## 2018-05-06 MED ORDER — HYDROCODONE-ACETAMINOPHEN 5-325 MG PO TABS: 1.0000 | ORAL_TABLET | ORAL | 0 refills | Status: DC | PRN

## 2018-05-06 NOTE — Discharge Instructions (Signed)
You may take the hydrocodone prescribed for pain relief.  This will make you drowsy - do not drive within 4 hours of taking this medication.  You may also apply a heating pad to the site for 10-15 minutes which may help with pain.  Ibuprofen can also help with pain and inflammation which has been prescribed for you.

## 2018-05-06 NOTE — ED Triage Notes (Signed)
Pt C/O right sided neck pain that started on 05/04/18. [facility] "swollen hard right lymph node for about 1 year." Pt states it has not hurt until 11/8.

## 2018-05-06 NOTE — ED Provider Notes (Addendum)
[facility] EMERGENCY DEPARTMENT Provider Note   CSN: [id number] Arrival date & time: 05/06/18  2032     History   Chief Complaint Chief Complaint  Patient presents with  . Neck Pain    HPI Gerald Salas. is a 67 y.o. male who reports a chronically enlarged lymph node in his right neck, stating it has been present for at least the past year.  He is followed by an ENT specialist at the [facility] and reports a biopsy of the node was negative for cancer.  He states the plan was simply watch and wait as it was expected to continue to enlarge and if it became painful, he would undergo excision of the node.  It became painful 2 days ago.  He denies fevers or chills and has no difficulty swallowing.  He has taken tylenol without relief.    Of note, he states he also has lung nodules that have been followed, most recent CT chest just completed and was contacted this week about need to see a pulmonologist which has been arranged for early December.  Pt suspects they have found changes on his CT scan.  The history is provided by the patient and the spouse.    Past Medical History:  Diagnosis Date  . Adenomatous colon polyp 10/2007  . Angina   . Arthritis   . COPD (chronic obstructive pulmonary disease) ([place])   . Gallstones   . GERD (gastroesophageal reflux disease)   . Headache(784.0)   . High cholesterol   . Hyperthyroidism     Patient Active Problem List   Diagnosis Date Noted  . Chronic neck pain 02/27/2015  . Hyperthyroidism 02/27/2015  . TIA (transient ischemic attack) 02/27/2015  . COPD (chronic obstructive pulmonary disease) ([place])   . Tobacco use disorder   . Cholecystitis with cholelithiasis. Lap chole 09/28/2011. 09/28/2011  . GERD (gastroesophageal reflux disease) 07/29/2011    Past Surgical History:  Procedure Laterality Date  . ANTERIOR CERVICAL DECOMP/DISCECTOMY FUSION  1994   S/P neck fracture  . BLADDER NECK RECONSTRUCTION    . CARDIAC CATHETERIZATION    .  [place]   left  . CHOLECYSTECTOMY  09/28/2011   Procedure: LAPAROSCOPIC CHOLECYSTECTOMY WITH INTRAOPERATIVE CHOLANGIOGRAM;  Surgeon: Shann Medal, MD;  Location: [place];  Service: General;  Laterality: N/A;  . ESOPHAGOGASTRODUODENOSCOPY  03/02/2011   Procedure: ESOPHAGOGASTRODUODENOSCOPY (EGD);  Surgeon: Jamesetta So;  [facility];  Service: Gastroenterology;  Laterality: N/A;  . HAND SURGERY  1980's   "got it crushed"  . TONSILLECTOMY AND ADENOIDECTOMY  1960's        Home Medications    Prior to Admission medications   Medication Sig Start Date End Date Taking? Authorizing Provider  acetaminophen (TYLENOL) 500 MG tablet Take 1,000 mg by mouth every 6 (six) hours as needed. For pain     [provider]  aspirin 325 MG tablet Take 1 tablet (325 mg total) by mouth daily. 02/27/15   Samuella Cota, MD  atorvastatin (LIPITOR) 40 MG tablet Take 1 tablet (40 mg total) by mouth daily at 6 PM. 02/27/15   Samuella Cota, MD  doxycycline (VIBRAMYCIN) 100 MG capsule Take 1 capsule (100 mg total) by mouth 2 (two) times daily. 12/04/15   Lily Kocher, PA-C  HYDROcodone-acetaminophen (NORCO/VICODIN) 5-325 MG tablet Take 1 tablet by mouth every 4 (four) hours as needed. 05/06/18   Evalee Jefferson, PA-C  ibuprofen (ADVIL,MOTRIN) 600 MG tablet Take  1 tablet (600 mg total) by mouth every 6 (six) hours as needed. 05/06/18   Evalee Jefferson, PA-C  methimazole (TAPAZOLE) 10 MG tablet Take 5 mg by mouth daily.     [provider]    Family History Family History  Problem Relation Age of Onset  . Cancer Mother   . Malignant hyperthermia Mother   . Cancer Father        brain tumor/?prostate  . Malignant hyperthermia Father   . Cancer Sister   . Malignant hyperthermia Sister   . Cancer Son        breast  . Cancer Paternal Aunt        breast    Social History Social History   Tobacco Use  . Smoking status: Current Every Day Smoker    Packs/day: 1.00      Years: 34.00    Pack years: 34.00    Types: Cigarettes  . Smokeless tobacco: Former Systems developer    Types: Chew    Quit date: 06/27/1970  . Tobacco comment: Counseling sheet given 10-2011   Substance Use Topics  . Alcohol use: No  . Drug use: No     Allergies   Codeine; Tylox [oxycodone-acetaminophen]; and Oxycodone-acetaminophen   Review of Systems Review of Systems  Constitutional: Negative for activity change, appetite change, chills, diaphoresis and fever.  HENT: Negative for congestion, ear discharge, facial swelling, sore throat, trouble swallowing and voice change.   Eyes: Negative.   Respiratory: Negative for chest tightness and shortness of breath.   Cardiovascular: Negative for chest pain.  Gastrointestinal: Negative for abdominal pain, nausea and vomiting.  Genitourinary: Negative.   Musculoskeletal: Positive for neck pain. Negative for arthralgias and joint swelling.  Skin: Negative.  Negative for rash and wound.  Neurological: Negative for dizziness, weakness, light-headedness and headaches.  Psychiatric/Behavioral: Negative.      Physical Exam Updated Vital Signs BP 121/67 (BP Location: Right Arm)   Pulse 70   Temp 97.8 F (36.6 C) (Oral)   Resp 17   Ht 5\' 11"  (1.803 m)   Wt 83.5 kg   SpO2 97%   BMI 25.66 kg/m   Physical Exam  Constitutional: He appears well-developed and well-nourished.  HENT:  Head: Normocephalic and atraumatic.  Right Ear: Tympanic membrane and ear canal normal.  Left Ear: Tympanic membrane and ear canal normal.  Nose: Nose normal.  Mouth/Throat: Oropharynx is clear and moist and mucous membranes are normal. No uvula swelling. No oropharyngeal exudate.  Eyes: Conjunctivae are normal.  Neck: Normal range of motion.  Moderately large and tender lymph node of right anterior chain.  Mobile, not hard or fixed. No skin erythema or lesions.  Cardiovascular: Normal rate, regular rhythm, normal heart sounds and intact distal pulses.   Pulmonary/Chest: Effort normal and breath sounds normal. He has no wheezes. He has no rales.  Abdominal: Soft. Bowel sounds are normal. There is no tenderness.  Musculoskeletal: Normal range of motion.  Lymphadenopathy:    He has cervical adenopathy.  Neurological: He is alert.  Skin: Skin is warm and dry.  Psychiatric: He has a normal mood and affect.  Nursing note and vitals reviewed.    ED Treatments / Results  Labs (all labs ordered are listed, but only abnormal results are displayed) Labs Reviewed - No data to display  EKG None  Radiology No results found.  Procedures Procedures (including critical care time)  Medications Ordered in ED Medications  HYDROcodone-acetaminophen (NORCO/VICODIN) 5-325 MG per tablet 1 tablet (1  tablet Oral Given 05/06/18 2221)     Initial Impression / Assessment and Plan / ED Course  I have reviewed the triage vital signs and the nursing notes.  Pertinent labs & imaging results that were available during my care of the patient were reviewed by me and considered in my medical decision making (see chart for details).     Pt with chronically hypertrophied right cervical lymph node, enlarged and tender x 2 days. No sign of infected node. He was given hydrocodone and ibuprofen for pain and inflammation.  Suggested heat tx.  Plan to contact his ENT with [facility] tomorrow to arrange office f/u and definitive management.   [place] controlled substance database reviewed.   Pt discussed with Dr Rogene Houston prior to dc home.  Final Clinical Impressions(s) / ED Diagnoses   Final diagnoses:  Lymphadenitis, acute    ED Discharge Orders         Ordered    HYDROcodone-acetaminophen (NORCO/VICODIN) 5-325 MG tablet  Every 4 hours PRN     05/06/18 2211    ibuprofen (ADVIL,MOTRIN) 600 MG tablet  Every 6 hours PRN     05/06/18 2215           Evalee Jefferson, PA-C 05/07/18 0007    Evalee Jefferson, PA-C 05/07/18 0009    Fredia Sorrow,  MD 05/07/18 [phone]

## 2018-07-12 ENCOUNTER — Other Ambulatory Visit: Payer: Self-pay

## 2018-07-12 ENCOUNTER — Emergency Department (HOSPITAL_COMMUNITY): Payer: Medicare HMO

## 2018-07-12 ENCOUNTER — Encounter (HOSPITAL_COMMUNITY): Payer: Self-pay | Admitting: Emergency Medicine

## 2018-07-12 ENCOUNTER — Emergency Department (HOSPITAL_COMMUNITY)
Admission: EM | Admit: 2018-07-12 | Discharge: 2018-07-12 | Disposition: A | Discharge status: Home / Self Care | Payer: Medicare HMO | Attending: Emergency Medicine | Admitting: Emergency Medicine

## 2018-07-12 DIAGNOSIS — R05 Cough: Secondary | ICD-10-CM | POA: Diagnosis not present

## 2018-07-12 DIAGNOSIS — Z79899 Other long term (current) drug therapy: Secondary | ICD-10-CM | POA: Insufficient documentation

## 2018-07-12 DIAGNOSIS — Z8673 Personal history of transient ischemic attack (TIA), and cerebral infarction without residual deficits: Secondary | ICD-10-CM | POA: Diagnosis not present

## 2018-07-12 DIAGNOSIS — J40 Bronchitis, not specified as acute or chronic: Secondary | ICD-10-CM | POA: Diagnosis not present

## 2018-07-12 DIAGNOSIS — F17210 Nicotine dependence, cigarettes, uncomplicated: Secondary | ICD-10-CM | POA: Insufficient documentation

## 2018-07-12 DIAGNOSIS — Z7982 Long term (current) use of aspirin: Secondary | ICD-10-CM | POA: Diagnosis not present

## 2018-07-12 DIAGNOSIS — J449 Chronic obstructive pulmonary disease, unspecified: Secondary | ICD-10-CM | POA: Insufficient documentation

## 2018-07-12 MED ORDER — IPRATROPIUM-ALBUTEROL 0.5-2.5 (3) MG/3ML IN SOLN
3.0000 mL | Freq: Once | RESPIRATORY_TRACT | Status: AC
  Administered 2018-07-12: 3 mL via RESPIRATORY_TRACT
  Filled 2018-07-12: qty 3

## 2018-07-12 MED ORDER — DOXYCYCLINE HYCLATE 100 MG PO CAPS: 100.0000 mg | ORAL_CAPSULE | Freq: Two times a day (BID) | ORAL | 0 refills | Status: DC

## 2018-07-12 MED ORDER — ALBUTEROL SULFATE HFA 108 (90 BASE) MCG/ACT IN AERS
2.0000 | INHALATION_SPRAY | RESPIRATORY_TRACT | Status: DC | PRN
  Filled 2018-07-12: qty 6.7

## 2018-07-12 MED ORDER — ALBUTEROL SULFATE (2.5 MG/3ML) 0.083% IN NEBU
2.5000 mg | INHALATION_SOLUTION | Freq: Once | RESPIRATORY_TRACT | Status: AC
  Administered 2018-07-12: 2.5 mg via RESPIRATORY_TRACT
  Filled 2018-07-12: qty 3

## 2018-07-12 NOTE — Discharge Instructions (Addendum)
Use the inhaler as needed and follow-up with your doctor if not improving

## 2018-07-12 NOTE — ED Triage Notes (Signed)
Pt c/o cough/runny nose/sorethroat x 11days. otc meds with no relief. Nad.

## 2018-07-12 NOTE — ED Provider Notes (Signed)
[facility] EMERGENCY DEPARTMENT Provider Note   CSN: [id number] Arrival date & time: 07/12/18  2012     History   Chief Complaint Chief Complaint  Patient presents with  . Cough  . Sore Throat    HPI Gerald Deman. is a 68 y.o. male.  Patient complains of cough congestion short of breath  The history is provided by the patient. No language interpreter was used.  Cough  Cough characteristics:  Productive Sputum characteristics:  Nondescript Severity:  Moderate Onset quality:  Sudden Duration:  5 days Timing:  Constant Progression:  Waxing and waning Chronicity:  New Smoker: yes   Context: not animal exposure   Relieved by:  Nothing Worsened by:  Nothing Ineffective treatments:  None tried Associated symptoms: no chest pain, no eye discharge, no headaches and no rash   Sore Throat  Pertinent negatives include no chest pain, no abdominal pain and no headaches.    Past Medical History:  Diagnosis Date  . Adenomatous colon polyp 10/2007  . Angina   . Arthritis   . COPD (chronic obstructive pulmonary disease) ([place])   . Gallstones   . GERD (gastroesophageal reflux disease)   . Headache(784.0)   . High cholesterol   . Hyperthyroidism     Patient Active Problem List   Diagnosis Date Noted  . Chronic neck pain 02/27/2015  . Hyperthyroidism 02/27/2015  . TIA (transient ischemic attack) 02/27/2015  . COPD (chronic obstructive pulmonary disease) ([place])   . Tobacco use disorder   . Cholecystitis with cholelithiasis. Lap chole 09/28/2011. 09/28/2011  . GERD (gastroesophageal reflux disease) 07/29/2011    Past Surgical History:  Procedure Laterality Date  . ANTERIOR CERVICAL DECOMP/DISCECTOMY FUSION  1994   S/P neck fracture  . BLADDER NECK RECONSTRUCTION    . CARDIAC CATHETERIZATION    . [place]   left  . CHOLECYSTECTOMY  09/28/2011   Procedure: LAPAROSCOPIC CHOLECYSTECTOMY WITH INTRAOPERATIVE CHOLANGIOGRAM;  Surgeon: Shann Medal, MD;   Location: [place];  Service: General;  Laterality: N/A;  . ESOPHAGOGASTRODUODENOSCOPY  03/02/2011   Procedure: ESOPHAGOGASTRODUODENOSCOPY (EGD);  Surgeon: Jamesetta So;  [facility];  Service: Gastroenterology;  Laterality: N/A;  . HAND SURGERY  1980's   "got it crushed"  . TONSILLECTOMY AND ADENOIDECTOMY  1960's        Home Medications    Prior to Admission medications   Medication Sig Start Date End Date Taking? Authorizing Provider  acetaminophen (TYLENOL) 500 MG tablet Take 1,000 mg by mouth every 6 (six) hours as needed. For pain    Yes [provider]  aspirin 325 MG tablet Take 1 tablet (325 mg total) by mouth daily. 02/27/15  Yes Samuella Cota, MD  atorvastatin (LIPITOR) 40 MG tablet Take 1 tablet (40 mg total) by mouth daily at 6 PM. 02/27/15  Yes Samuella Cota, MD  methimazole (TAPAZOLE) 10 MG tablet Take 10 mg by mouth daily.    Yes [provider]  doxycycline (VIBRAMYCIN) 100 MG capsule Take 1 capsule (100 mg total) by mouth 2 (two) times daily. One po bid x 7 days 07/12/18   Milton Ferguson, MD  HYDROcodone-acetaminophen (NORCO/VICODIN) 5-325 MG tablet Take 1 tablet by mouth every 4 (four) hours as needed. Patient not taking: Reported on 07/12/2018 05/06/18   Evalee Jefferson, PA-C  ibuprofen (ADVIL,MOTRIN) 600 MG tablet Take 1 tablet (600 mg total) by mouth every 6 (six) hours as needed. Patient not taking: Reported on 07/12/2018 05/06/18  Evalee Jefferson, PA-C    Family History Family History  Problem Relation Age of Onset  . Cancer Mother   . Malignant hyperthermia Mother   . Cancer Father        brain tumor/?prostate  . Malignant hyperthermia Father   . Cancer Sister   . Malignant hyperthermia Sister   . Cancer Son        breast  . Cancer Paternal Aunt        breast    Social History Social History   Tobacco Use  . Smoking status: Current Every Day Smoker    Packs/day: 1.00    Years: 34.00    Pack years: 34.00    Types:  Cigarettes  . Smokeless tobacco: Former Systems developer    Types: Chew    Quit date: 06/27/1970  . Tobacco comment: Counseling sheet given 10-2011   Substance Use Topics  . Alcohol use: No  . Drug use: No     Allergies   Codeine; Tylox [oxycodone-acetaminophen]; and Oxycodone-acetaminophen   Review of Systems Review of Systems  Constitutional: Negative for appetite change and fatigue.  HENT: Negative for congestion, ear discharge and sinus pressure.   Eyes: Negative for discharge.  Respiratory: Positive for cough.   Cardiovascular: Negative for chest pain.  Gastrointestinal: Negative for abdominal pain and diarrhea.  Genitourinary: Negative for frequency and hematuria.  Musculoskeletal: Negative for back pain.  Skin: Negative for rash.  Neurological: Negative for seizures and headaches.  Psychiatric/Behavioral: Negative for hallucinations.     Physical Exam Updated Vital Signs BP (!) 149/77 (BP Location: Right Arm)   Pulse 75   Temp 97.8 F (36.6 C) (Oral)   Resp 18   Ht 5\' 11"  (1.803 m)   Wt 83.6 kg   SpO2 95%   BMI 25.71 kg/m   Physical Exam Vitals signs and nursing note reviewed.  Constitutional:      Appearance: He is well-developed.  HENT:     Head: Normocephalic.     Nose: Nose normal.  Eyes:     General: No scleral icterus.    Conjunctiva/sclera: Conjunctivae normal.  Neck:     Musculoskeletal: Neck supple.     Thyroid: No thyromegaly.  Cardiovascular:     Rate and Rhythm: Normal rate and regular rhythm.     Heart sounds: No murmur. No friction rub. No gallop.   Pulmonary:     Breath sounds: No stridor. Wheezing present. No rales.  Chest:     Chest wall: No tenderness.  Abdominal:     General: There is no distension.     Tenderness: There is no abdominal tenderness. There is no rebound.  Musculoskeletal: Normal range of motion.  Lymphadenopathy:     Cervical: No cervical adenopathy.  Skin:    Findings: No erythema or rash.  Neurological:     Mental  Status: He is alert and oriented to person, place, and time.     Motor: No abnormal muscle tone.     Coordination: Coordination normal.  Psychiatric:        Behavior: Behavior normal.      ED Treatments / Results  Labs (all labs ordered are listed, but only abnormal results are displayed) Labs Reviewed - No data to display  EKG None  Radiology Dg Chest 2 View  Result Date: 07/12/2018 CLINICAL DATA:  Productive cough, runny nose and sore throat for 11 days, history COPD EXAM: CHEST - 2 VIEW COMPARISON:  12/04/2015 FINDINGS: Normal heart size, mediastinal contours, and  pulmonary vascularity. 2 small nodular foci in the LEFT upper lobe, 1 of which appears minimally more prominent Questionable developing density in RIGHT upper lobe. Remaining lungs clear. No pleural effusion or pneumothorax. Bones unremarkable. IMPRESSION: Question developing density in the RIGHT upper lobe with a slightly increased prominence of a LEFT upper lobe nodular focus as well. CT chest recommended to exclude developing RIGHT upper lobe mass/nodule. Electronically Signed   By: Lavonia Dana M.D.   On: 07/12/2018 20:57    Procedures Procedures (including critical care time)  Medications Ordered in ED Medications  albuterol (PROVENTIL HFA;VENTOLIN HFA) 108 (90 Base) MCG/ACT inhaler 2 puff (has no administration in time range)  ipratropium-albuterol (DUONEB) 0.5-2.5 (3) MG/3ML nebulizer solution 3 mL (3 mLs Nebulization Given 07/12/18 2236)  albuterol (PROVENTIL) (2.5 MG/3ML) 0.083% nebulizer solution 2.5 mg (2.5 mg Nebulization Given 07/12/18 2236)     Initial Impression / Assessment and Plan / ED Course  I have reviewed the triage vital signs and the nursing notes.  Pertinent labs & imaging results that were available during my care of the patient were reviewed by me and considered in my medical decision making (see chart for details).     Patient with bronchitis and bronchospasm.  He also has some  questionable nodules in his lung.  Patient states that he is being followed by pulmonologist for this.  Patient will be placed on doxycycline and given an albuterol inhaler.  And will follow-up with PCP Final Clinical Impressions(s) / ED Diagnoses   Final diagnoses:  Bronchitis    ED Discharge Orders         Ordered    doxycycline (VIBRAMYCIN) 100 MG capsule  2 times daily     07/12/18 2320           Milton Ferguson, MD 07/12/18 2322

## 2018-11-02 ENCOUNTER — Encounter (HOSPITAL_COMMUNITY): Payer: Self-pay | Admitting: Emergency Medicine

## 2018-11-02 ENCOUNTER — Other Ambulatory Visit: Payer: Self-pay

## 2018-11-02 ENCOUNTER — Emergency Department (HOSPITAL_COMMUNITY)
Admission: EM | Admit: 2018-11-02 | Discharge: 2018-11-02 | Disposition: A | Discharge status: Home / Self Care | Payer: Medicare HMO | Attending: Emergency Medicine | Admitting: Emergency Medicine

## 2018-11-02 ENCOUNTER — Emergency Department (HOSPITAL_COMMUNITY): Payer: Medicare HMO

## 2018-11-02 DIAGNOSIS — I10 Essential (primary) hypertension: Secondary | ICD-10-CM | POA: Diagnosis not present

## 2018-11-02 DIAGNOSIS — Y939 Activity, unspecified: Secondary | ICD-10-CM | POA: Diagnosis not present

## 2018-11-02 DIAGNOSIS — F17210 Nicotine dependence, cigarettes, uncomplicated: Secondary | ICD-10-CM | POA: Insufficient documentation

## 2018-11-02 DIAGNOSIS — M25461 Effusion, right knee: Secondary | ICD-10-CM | POA: Diagnosis not present

## 2018-11-02 DIAGNOSIS — J449 Chronic obstructive pulmonary disease, unspecified: Secondary | ICD-10-CM | POA: Diagnosis not present

## 2018-11-02 DIAGNOSIS — M7042 Prepatellar bursitis, left knee: Secondary | ICD-10-CM | POA: Insufficient documentation

## 2018-11-02 DIAGNOSIS — Z79899 Other long term (current) drug therapy: Secondary | ICD-10-CM | POA: Insufficient documentation

## 2018-11-02 DIAGNOSIS — Z8673 Personal history of transient ischemic attack (TIA), and cerebral infarction without residual deficits: Secondary | ICD-10-CM | POA: Diagnosis not present

## 2018-11-02 DIAGNOSIS — M7989 Other specified soft tissue disorders: Secondary | ICD-10-CM | POA: Diagnosis not present

## 2018-11-02 DIAGNOSIS — M25562 Pain in left knee: Secondary | ICD-10-CM | POA: Diagnosis present

## 2018-11-02 DIAGNOSIS — Z7982 Long term (current) use of aspirin: Secondary | ICD-10-CM | POA: Insufficient documentation

## 2018-11-02 MED ORDER — NAPROXEN 500 MG PO TABS: 500.0000 mg | ORAL_TABLET | Freq: Two times a day (BID) | ORAL | 0 refills | Status: DC

## 2018-11-02 MED ORDER — HYDROCODONE-ACETAMINOPHEN 5-325 MG PO TABS: 1.0000 | ORAL_TABLET | Freq: Four times a day (QID) | ORAL | 0 refills | Status: DC | PRN

## 2018-11-02 NOTE — Discharge Instructions (Addendum)
Naproxen as prescribed.  Rest.  Hydrocodone as prescribed as needed for pain not relieved with naproxen.

## 2018-11-02 NOTE — ED Notes (Signed)
ED Provider at bedside. 

## 2018-11-02 NOTE — ED Provider Notes (Signed)
[facility] EMERGENCY DEPARTMENT Provider Note   CSN: [id number] Arrival date & time: 11/02/18  1801    History   Chief Complaint Chief Complaint  Patient presents with  . Leg Pain    HPI Gerald Salas. is a 68 y.o. male.     Patient is a 68 year old male with history of COPD hypercholesterolemia.  He presents today for evaluation of right knee pain.  This is worsened over the past 2 days.  He denies any specific injury or trauma.  He describes sharp pain to the front of the knee that is worse when he ambulates and worse when he pushes on it.  He denies any fevers or chills.  He does feel as though the leg is more swollen and into the ankle.  He denies any calf pain, chest pain, or shortness of breath.  The history is provided by the patient.  Leg Pain  Location:  Knee Time since incident:  2 days Knee location:  R knee Pain details:    Radiates to:  Does not radiate   Severity:  Moderate   Onset quality:  Sudden   Duration:  2 days   Timing:  Constant   Progression:  Worsening Chronicity:  New Relieved by:  Nothing Worsened by:  Nothing Ineffective treatments:  None tried   Past Medical History:  Diagnosis Date  . Adenomatous colon polyp 10/2007  . Angina   . Arthritis   . COPD (chronic obstructive pulmonary disease) ([place])   . Gallstones   . GERD (gastroesophageal reflux disease)   . Headache(784.0)   . High cholesterol   . Hyperthyroidism     Patient Active Problem List   Diagnosis Date Noted  . Chronic neck pain 02/27/2015  . Hyperthyroidism 02/27/2015  . TIA (transient ischemic attack) 02/27/2015  . COPD (chronic obstructive pulmonary disease) ([place])   . Tobacco use disorder   . Cholecystitis with cholelithiasis. Lap chole 09/28/2011. 09/28/2011  . GERD (gastroesophageal reflux disease) 07/29/2011    Past Surgical History:  Procedure Laterality Date  . ANTERIOR CERVICAL DECOMP/DISCECTOMY FUSION  1994   S/P neck fracture  . BLADDER NECK  RECONSTRUCTION    . CARDIAC CATHETERIZATION    . [place]   left  . CHOLECYSTECTOMY  09/28/2011   Procedure: LAPAROSCOPIC CHOLECYSTECTOMY WITH INTRAOPERATIVE CHOLANGIOGRAM;  Surgeon: Shann Medal, MD;  Location: [place];  Service: General;  Laterality: N/A;  . ESOPHAGOGASTRODUODENOSCOPY  03/02/2011   Procedure: ESOPHAGOGASTRODUODENOSCOPY (EGD);  Surgeon: Jamesetta So;  [facility];  Service: Gastroenterology;  Laterality: N/A;  . HAND SURGERY  1980's   "got it crushed"  . TONSILLECTOMY AND ADENOIDECTOMY  1960's        Home Medications    Prior to Admission medications   Medication Sig Start Date End Date Taking? Authorizing Provider  acetaminophen (TYLENOL) 500 MG tablet Take 1,000 mg by mouth every 6 (six) hours as needed. For pain     [provider]  aspirin 325 MG tablet Take 1 tablet (325 mg total) by mouth daily. 02/27/15   Samuella Cota, MD  atorvastatin (LIPITOR) 40 MG tablet Take 1 tablet (40 mg total) by mouth daily at 6 PM. 02/27/15   Samuella Cota, MD  doxycycline (VIBRAMYCIN) 100 MG capsule Take 1 capsule (100 mg total) by mouth 2 (two) times daily. One po bid x 7 days 07/12/18   Milton Ferguson, MD  HYDROcodone-acetaminophen (NORCO/VICODIN) 5-325 MG tablet Take 1 tablet  by mouth every 4 (four) hours as needed. Patient not taking: Reported on 07/12/2018 05/06/18   Evalee Jefferson, PA-C  ibuprofen (ADVIL,MOTRIN) 600 MG tablet Take 1 tablet (600 mg total) by mouth every 6 (six) hours as needed. Patient not taking: Reported on 07/12/2018 05/06/18   Evalee Jefferson, PA-C  methimazole (TAPAZOLE) 10 MG tablet Take 10 mg by mouth daily.     [provider]    Family History Family History  Problem Relation Age of Onset  . Cancer Mother   . Malignant hyperthermia Mother   . Cancer Father        brain tumor/?prostate  . Malignant hyperthermia Father   . Cancer Sister   . Malignant hyperthermia Sister   . Cancer Son        breast   . Cancer Paternal Aunt        breast    Social History Social History   Tobacco Use  . Smoking status: Current Every Day Smoker    Packs/day: 1.00    Years: 34.00    Pack years: 34.00    Types: Cigarettes  . Smokeless tobacco: Former Systems developer    Types: Chew    Quit date: 06/27/1970  . Tobacco comment: Counseling sheet given 10-2011   Substance Use Topics  . Alcohol use: No  . Drug use: No     Allergies   Codeine; Tylox [oxycodone-acetaminophen]; and Oxycodone-acetaminophen   Review of Systems Review of Systems  All other systems reviewed and are negative.    Physical Exam Updated Vital Signs BP (!) 152/70 (BP Location: Right Arm)   Pulse 62   Temp 98 F (36.7 C) (Oral)   Resp 14   Ht 5\' 11"  (1.803 m)   Wt 85 kg   SpO2 99%   BMI 26.15 kg/m   Physical Exam Vitals signs and nursing note reviewed.  Constitutional:      General: He is not in acute distress.    Appearance: Normal appearance. He is not ill-appearing.  HENT:     Head: Normocephalic and atraumatic.  Pulmonary:     Effort: Pulmonary effort is normal.  Musculoskeletal:     Comments: The right knee has swelling over the prepatellar bursa.  There is point tenderness in this area.  He has good range of motion with minimal discomfort.  There is no crepitus.  There is no instability to varus or valgus stress and anterior and posterior drawer test are negative.  He does have 1+ edema of the lower right leg which is slightly larger than the left.  There is no calf tenderness and Homans sign is absent.  Skin:    General: Skin is warm and dry.  Neurological:     Mental Status: He is alert.      ED Treatments / Results  Labs (all labs ordered are listed, but only abnormal results are displayed) Labs Reviewed - No data to display  EKG None  Radiology No results found.  Procedures Procedures (including critical care time)  Medications Ordered in ED Medications - No data to display   Initial  Impression / Assessment and Plan / ED Course  I have reviewed the triage vital signs and the nursing notes.  Pertinent labs & imaging results that were available during my care of the patient were reviewed by me and considered in my medical decision making (see chart for details).  Patient with swelling and pain to the anterior aspect of his left knee.  He has  what appears to be a prepatellar bursitis based on exam and x-rays. Patient will be discharged with nsaids, pain meds, return prn.  Final Clinical Impressions(s) / ED Diagnoses   Final diagnoses:  None    ED Discharge Orders    None       Veryl Speak, MD 11/02/18 1929

## 2018-11-02 NOTE — ED Triage Notes (Signed)
Pt states that from his knee down on the right side is swollen and hurting he denies injury

## 2019-05-18 ENCOUNTER — Emergency Department (HOSPITAL_COMMUNITY)
Admission: EM | Admit: 2019-05-18 | Discharge: 2019-05-18 | Disposition: A | Discharge status: Home / Self Care | Payer: No Typology Code available for payment source | Attending: Emergency Medicine | Admitting: Emergency Medicine

## 2019-05-18 ENCOUNTER — Emergency Department (HOSPITAL_COMMUNITY): Payer: No Typology Code available for payment source

## 2019-05-18 ENCOUNTER — Other Ambulatory Visit: Payer: Self-pay

## 2019-05-18 ENCOUNTER — Encounter (HOSPITAL_COMMUNITY): Payer: Self-pay | Admitting: Emergency Medicine

## 2019-05-18 DIAGNOSIS — F17210 Nicotine dependence, cigarettes, uncomplicated: Secondary | ICD-10-CM | POA: Insufficient documentation

## 2019-05-18 DIAGNOSIS — Y999 Unspecified external cause status: Secondary | ICD-10-CM | POA: Insufficient documentation

## 2019-05-18 DIAGNOSIS — X58XXXA Exposure to other specified factors, initial encounter: Secondary | ICD-10-CM | POA: Diagnosis not present

## 2019-05-18 DIAGNOSIS — Z7982 Long term (current) use of aspirin: Secondary | ICD-10-CM | POA: Diagnosis not present

## 2019-05-18 DIAGNOSIS — Y939 Activity, unspecified: Secondary | ICD-10-CM | POA: Diagnosis not present

## 2019-05-18 DIAGNOSIS — S39012A Strain of muscle, fascia and tendon of lower back, initial encounter: Secondary | ICD-10-CM | POA: Diagnosis not present

## 2019-05-18 DIAGNOSIS — J449 Chronic obstructive pulmonary disease, unspecified: Secondary | ICD-10-CM | POA: Diagnosis not present

## 2019-05-18 DIAGNOSIS — Y929 Unspecified place or not applicable: Secondary | ICD-10-CM | POA: Insufficient documentation

## 2019-05-18 DIAGNOSIS — Z8673 Personal history of transient ischemic attack (TIA), and cerebral infarction without residual deficits: Secondary | ICD-10-CM | POA: Insufficient documentation

## 2019-05-18 DIAGNOSIS — Z79899 Other long term (current) drug therapy: Secondary | ICD-10-CM | POA: Diagnosis not present

## 2019-05-18 DIAGNOSIS — S3992XA Unspecified injury of lower back, initial encounter: Secondary | ICD-10-CM | POA: Diagnosis present

## 2019-05-18 MED ORDER — HYDROCODONE-ACETAMINOPHEN 5-325 MG PO TABS: 1.0000 | ORAL_TABLET | Freq: Four times a day (QID) | ORAL | 0 refills | Status: DC | PRN

## 2019-05-18 MED ORDER — HYDROCODONE-ACETAMINOPHEN 5-325 MG PO TABS
1.0000 | ORAL_TABLET | Freq: Once | ORAL | Status: AC
  Administered 2019-05-18: 14:00:00 2 via ORAL
  Filled 2019-05-18: qty 2

## 2019-05-18 MED ORDER — CYCLOBENZAPRINE HCL 10 MG PO TABS: 10.0000 mg | ORAL_TABLET | Freq: Two times a day (BID) | ORAL | 0 refills | Status: DC | PRN

## 2019-05-18 MED ORDER — ONDANSETRON 4 MG PO TBDP
4.0000 mg | ORAL_TABLET | Freq: Once | ORAL | Status: AC
  Administered 2019-05-18: 4 mg via ORAL
  Filled 2019-05-18: qty 1

## 2019-05-18 MED ORDER — MELOXICAM 7.5 MG PO TABS: 7.5000 mg | ORAL_TABLET | Freq: Every day | ORAL | 0 refills | Status: DC

## 2019-05-18 NOTE — ED Triage Notes (Signed)
Pt states he has been having bad lower back pain x 2 weeks with no hx of. Felt a pop in his back with pain radiating around to lower abdomen with movement. Denies distal neuro changes.

## 2019-05-18 NOTE — Discharge Instructions (Signed)
SEEK IMMEDIATE MEDICAL ATTENTION IF: New numbness, tingling, weakness, or problem with the use of your arms or legs.  Severe back pain not relieved with medications.  Change in bowel or bladder control.  Increasing pain in any areas of the body (such as chest or abdominal pain).  Shortness of breath, dizziness or fainting.  Nausea (feeling sick to your stomach), vomiting, fever, or sweats.  

## 2019-05-18 NOTE — ED Provider Notes (Signed)
[facility] EMERGENCY DEPARTMENT Provider Note   CSN: [id number] Arrival date & time: 05/18/19  1234     History   Chief Complaint Chief Complaint  Patient presents with  . Back Pain    HPI Gerald Pirl. is a 68 y.o. male.      Back Pain Location:  Lumbar spine Quality:  Stabbing, aching and stiffness Stiffness is present:  All day Radiates to: Right abdomen. Pain severity:  Severe Pain is:  Same all the time Onset quality:  Sudden Duration:  2 weeks Timing:  Constant Progression:  Unchanged Chronicity:  New Context: twisting   Relieved by:  Being still Worsened by:  Ambulation, bending, coughing, movement, palpation, touching, twisting, standing and sneezing Ineffective treatments:  OTC medications Associated symptoms: no abdominal pain, no abdominal swelling, no bladder incontinence, no bowel incontinence, no chest pain, no dysuria, no fever, no headaches, no leg pain, no numbness, no paresthesias, no pelvic pain, no perianal numbness, no tingling, no weakness and no weight loss   Risk factors: no hx of cancer and no hx of osteoporosis     Past Medical History:  Diagnosis Date  . Adenomatous colon polyp 10/2007  . Angina   . Arthritis   . COPD (chronic obstructive pulmonary disease) ([place])   . Gallstones   . GERD (gastroesophageal reflux disease)   . Headache(784.0)   . High cholesterol   . Hyperthyroidism     Patient Active Problem List   Diagnosis Date Noted  . Chronic neck pain 02/27/2015  . Hyperthyroidism 02/27/2015  . TIA (transient ischemic attack) 02/27/2015  . COPD (chronic obstructive pulmonary disease) ([place])   . Tobacco use disorder   . Cholecystitis with cholelithiasis. Lap chole 09/28/2011. 09/28/2011  . GERD (gastroesophageal reflux disease) 07/29/2011    Past Surgical History:  Procedure Laterality Date  . ANTERIOR CERVICAL DECOMP/DISCECTOMY FUSION  1994   S/P neck fracture  . BLADDER NECK RECONSTRUCTION    . CARDIAC  CATHETERIZATION    . [place]   left  . CHOLECYSTECTOMY  09/28/2011   Procedure: LAPAROSCOPIC CHOLECYSTECTOMY WITH INTRAOPERATIVE CHOLANGIOGRAM;  Surgeon: Shann Medal, MD;  Location: [place];  Service: General;  Laterality: N/A;  . ESOPHAGOGASTRODUODENOSCOPY  03/02/2011   Procedure: ESOPHAGOGASTRODUODENOSCOPY (EGD);  Surgeon: Jamesetta So;  [facility];  Service: Gastroenterology;  Laterality: N/A;  . HAND SURGERY  1980's   "got it crushed"  . TONSILLECTOMY AND ADENOIDECTOMY  1960's        Home Medications    Prior to Admission medications   Medication Sig Start Date End Date Taking? Authorizing Provider  acetaminophen (TYLENOL) 500 MG tablet Take 1,000 mg by mouth every 6 (six) hours as needed. For pain     [provider]  aspirin 325 MG tablet Take 1 tablet (325 mg total) by mouth daily. 02/27/15   Samuella Cota, MD  atorvastatin (LIPITOR) 40 MG tablet Take 1 tablet (40 mg total) by mouth daily at 6 PM. 02/27/15   Samuella Cota, MD  doxycycline (VIBRAMYCIN) 100 MG capsule Take 1 capsule (100 mg total) by mouth 2 (two) times daily. One po bid x 7 days 07/12/18   Milton Ferguson, MD  HYDROcodone-acetaminophen [facility]) 5-325 MG tablet Take 1-2 tablets by mouth every 6 (six) hours as needed. 11/02/18   Veryl Speak, MD  ibuprofen (ADVIL,MOTRIN) 600 MG tablet Take 1 tablet (600 mg total) by mouth every 6 (six) hours as needed. Patient not taking:  Reported on 07/12/2018 05/06/18   Evalee Jefferson, PA-C  methimazole (TAPAZOLE) 10 MG tablet Take 10 mg by mouth daily.     [provider]  naproxen (NAPROSYN) 500 MG tablet Take 1 tablet (500 mg total) by mouth 2 (two) times daily with a meal. 11/02/18   Veryl Speak, MD    Family History Family History  Problem Relation Age of Onset  . Cancer Mother   . Malignant hyperthermia Mother   . Cancer Father        brain tumor/?prostate  . Malignant hyperthermia Father   . Cancer Sister   .  Malignant hyperthermia Sister   . Cancer Son        breast  . Cancer Paternal Aunt        breast    Social History Social History   Tobacco Use  . Smoking status: Current Every Day Smoker    Packs/day: 1.00    Years: 34.00    Pack years: 34.00    Types: Cigarettes  . Smokeless tobacco: Former Systems developer    Types: Chew    Quit date: 06/27/1970  Substance Use Topics  . Alcohol use: No  . Drug use: No     Allergies   Codeine, Tylox [oxycodone-acetaminophen], and Oxycodone-acetaminophen   Review of Systems Review of Systems  Constitutional: Negative.  Negative for fever and weight loss.  HENT: Negative.   Eyes: Negative.   Respiratory: Negative.   Cardiovascular: Negative for chest pain.  Gastrointestinal: Negative for abdominal pain and bowel incontinence.  Endocrine: Negative.   Genitourinary: Negative.  Negative for bladder incontinence, dysuria and pelvic pain.  Musculoskeletal: Positive for back pain.  Skin: Negative.   Neurological: Negative for tingling, weakness, numbness, headaches and paresthesias.  Psychiatric/Behavioral: Negative.   All other systems reviewed and are negative.    Physical Exam Updated Vital Signs BP (!) 144/81 (BP Location: Right Arm)   Pulse 81   Temp (!) 97.2 F (36.2 C) (Oral)   Resp 16   Ht 5\' 11"  (1.803 m)   Wt 88.2 kg   SpO2 96%   BMI 27.13 kg/m   Physical Exam Vitals signs and nursing note reviewed.  Constitutional:      General: He is not in acute distress.    Appearance: He is well-developed. He is not diaphoretic.  HENT:     Head: Normocephalic and atraumatic.  Eyes:     General: No scleral icterus.    Conjunctiva/sclera: Conjunctivae normal.  Neck:     Musculoskeletal: Normal range of motion and neck supple.  Cardiovascular:     Rate and Rhythm: Normal rate and regular rhythm.     Heart sounds: Normal heart sounds.  Pulmonary:     Effort: Pulmonary effort is normal. No respiratory distress.     Breath sounds:  Normal breath sounds.  Abdominal:     General: There is no distension.     Palpations: Abdomen is soft.     Tenderness: There is no abdominal tenderness. There is no right CVA tenderness, left CVA tenderness or guarding.  Musculoskeletal:     Lumbar back: He exhibits tenderness, pain and spasm. He exhibits no bony tenderness and no swelling.       Back:     Comments: Patient with severe pain when changing position Unable to perform lumbar ROM due to pain and spasm Normal strength In the BL lower extremities  NVI   Skin:    General: Skin is warm and dry.  Neurological:  Mental Status: He is alert.  Psychiatric:        Behavior: Behavior normal.      ED Treatments / Results  Labs (all labs ordered are listed, but only abnormal results are displayed) Labs Reviewed - No data to display  EKG None  Radiology No results found.  Procedures Procedures (including critical care time)  Medications Ordered in ED Medications - No data to display   Initial Impression / Assessment and Plan / ED Course  I have reviewed the triage vital signs and the nursing notes.  Pertinent labs & imaging results that were available during my care of the patient were reviewed by me and considered in my medical decision making (see chart for details).        Patient with back pain.  No neurological deficits and normal neuro exam.  The emergent differential diagnosis for back pain includes but is not limited to fracture, muscle strain, cauda equina, spinal stenosis. DDD, ankylosing spondylitis, acute ligamentous injury, disk herniation, spondylolisthesis, Epidural compression syndrome, metastatic cancer, transverse myelitis, vertebral osteomyelitis, diskitis, kidney stone, pyelonephritis, AAA, Perforated ulcer, Retrocecal appendicitis, pancreatitis, bowel obstruction, retroperitoneal hemorrhage or mass, meningitis. Patient can walk but states is painful.  No loss of bowel or bladder control.  No  concern for cauda equina.  I personally reviewed the patient's lumbar spine film which shows normal spinal alignment without evidence of advanced degenerative changes and aortic atherosclerosis on my interpretation.  There is no evidence of pathologic fracture, no fever, night sweats, weight loss, h/o cancer, IVDU.  RICE protocol and pain medicine indicated and discussed with patient.  Follow-up with primary care physician if pain is not improving.  Final Clinical Impressions(s) / ED Diagnoses   Final diagnoses:  None    ED Discharge Orders    None       Margarita Mail, PA-C 05/18/19 1400    Dorie Rank, MD 05/18/19 1528

## 2019-06-13 ENCOUNTER — Emergency Department (HOSPITAL_COMMUNITY): Payer: No Typology Code available for payment source

## 2019-06-13 ENCOUNTER — Emergency Department (HOSPITAL_COMMUNITY)
Admission: EM | Admit: 2019-06-13 | Discharge: 2019-06-13 | Disposition: A | Discharge status: Home / Self Care | Payer: No Typology Code available for payment source | Attending: Emergency Medicine | Admitting: Emergency Medicine

## 2019-06-13 ENCOUNTER — Encounter (HOSPITAL_COMMUNITY): Payer: Self-pay | Admitting: *Deleted

## 2019-06-13 DIAGNOSIS — J449 Chronic obstructive pulmonary disease, unspecified: Secondary | ICD-10-CM | POA: Insufficient documentation

## 2019-06-13 DIAGNOSIS — G8918 Other acute postprocedural pain: Secondary | ICD-10-CM | POA: Diagnosis not present

## 2019-06-13 DIAGNOSIS — F17210 Nicotine dependence, cigarettes, uncomplicated: Secondary | ICD-10-CM | POA: Insufficient documentation

## 2019-06-13 DIAGNOSIS — K567 Ileus, unspecified: Secondary | ICD-10-CM

## 2019-06-13 DIAGNOSIS — Z8673 Personal history of transient ischemic attack (TIA), and cerebral infarction without residual deficits: Secondary | ICD-10-CM | POA: Insufficient documentation

## 2019-06-13 DIAGNOSIS — R1031 Right lower quadrant pain: Secondary | ICD-10-CM | POA: Diagnosis present

## 2019-06-13 DIAGNOSIS — Z7982 Long term (current) use of aspirin: Secondary | ICD-10-CM | POA: Diagnosis not present

## 2019-06-13 LAB — URINALYSIS, ROUTINE W REFLEX MICROSCOPIC
Bacteria, UA: NONE SEEN
Bilirubin Urine: NEGATIVE
Glucose, UA: NEGATIVE mg/dL
Ketones, ur: NEGATIVE mg/dL
Leukocytes,Ua: NEGATIVE
Nitrite: NEGATIVE
Protein, ur: NEGATIVE mg/dL
Specific Gravity, Urine: 1.01 (ref 1.005–1.030)
pH: 5 (ref 5.0–8.0)

## 2019-06-13 LAB — CBC WITH DIFFERENTIAL/PLATELET
Abs Immature Granulocytes: 0.04 10*3/uL (ref 0.00–0.07)
Basophils Absolute: 0.1 10*3/uL (ref 0.0–0.1)
Basophils Relative: 1 %
Eosinophils Absolute: 0.6 10*3/uL — ABNORMAL HIGH (ref 0.0–0.5)
Eosinophils Relative: 7 %
HCT: 46.7 % (ref 39.0–52.0)
Hemoglobin: 15.7 g/dL (ref 13.0–17.0)
Immature Granulocytes: 0 %
Lymphocytes Relative: 29 %
Lymphs Abs: 2.7 10*3/uL (ref 0.7–4.0)
MCH: 34.1 pg — ABNORMAL HIGH (ref 26.0–34.0)
MCHC: 33.6 g/dL (ref 30.0–36.0)
MCV: 101.3 fL — ABNORMAL HIGH (ref 80.0–100.0)
Monocytes Absolute: 0.8 10*3/uL (ref 0.1–1.0)
Monocytes Relative: 9 %
Neutro Abs: 5 10*3/uL (ref 1.7–7.7)
Neutrophils Relative %: 54 %
Platelets: 223 10*3/uL (ref 150–400)
RBC: 4.61 MIL/uL (ref 4.22–5.81)
RDW: 12.2 % (ref 11.5–15.5)
WBC: 9.1 10*3/uL (ref 4.0–10.5)
nRBC: 0 % (ref 0.0–0.2)

## 2019-06-13 LAB — COMPREHENSIVE METABOLIC PANEL
ALT: 29 U/L (ref 0–44)
AST: 18 U/L (ref 15–41)
Albumin: 4 g/dL (ref 3.5–5.0)
Alkaline Phosphatase: 23 U/L — ABNORMAL LOW (ref 38–126)
Anion gap: 10 (ref 5–15)
BUN: 13 mg/dL (ref 8–23)
CO2: 26 mmol/L (ref 22–32)
Calcium: 9.1 mg/dL (ref 8.9–10.3)
Chloride: 104 mmol/L (ref 98–111)
Creatinine, Ser: 1.17 mg/dL (ref 0.61–1.24)
GFR calc Af Amer: >60 mL/min (ref >60)
GFR calc non Af Amer: >60 mL/min (ref >60)
Glucose, Bld: 77 mg/dL (ref 70–99)
Potassium: 3.7 mmol/L (ref 3.5–5.1)
Sodium: 140 mmol/L (ref 135–145)
Total Bilirubin: 0.7 mg/dL (ref 0.3–1.2)
Total Protein: 7.6 g/dL (ref 6.5–8.1)

## 2019-06-13 LAB — LIPASE, BLOOD: Lipase: 42 U/L (ref 11–51)

## 2019-06-13 LAB — LACTIC ACID, PLASMA: Lactic Acid, Venous: 1 mmol/L (ref 0.5–1.9)

## 2019-06-13 MED ORDER — HYDROMORPHONE HCL 1 MG/ML IJ SOLN
1.0000 mg | Freq: Once | INTRAMUSCULAR | Status: AC
  Administered 2019-06-13: 1 mg via INTRAVENOUS
  Filled 2019-06-13: qty 1

## 2019-06-13 MED ORDER — IOHEXOL 300 MG/ML  SOLN
100.0000 mL | Freq: Once | INTRAMUSCULAR | Status: AC | PRN
  Administered 2019-06-13: 100 mL via INTRAVENOUS

## 2019-06-13 NOTE — ED Notes (Signed)
patietn sdtates he is allergic to tylox but is able to take oxycodone

## 2019-06-13 NOTE — ED Provider Notes (Addendum)
[facility] EMERGENCY DEPARTMENT Provider Note   CSN: [id number] Arrival date & time: 06/13/19  1017     History Chief Complaint  Patient presents with  . Post-op Problem    Gerald Headlee. is a 68 y.o. male with PMHx GERD, COPD, tobacco use who presents to the ED today complaining of gradual onset, constant, unchanged, sharp, RLQ abdominal pain s/p bilateral inguinal hernia repair approximately 2 weeks ago at [facility]. patient reports when he woke up from surgery he had pain to the right lower quadrant.  He states that he mentioned this to his surgeon and the nurses and was told to follow-up in 2 weeks if no improvement.  Patient states he has tried to call them several times to schedule appointment without success prompting him to come to the ED today.  Is adamant that his pain is unchanged from right after the surgery.  He reports he has been taking 15 mg oxycodone as needed for pain without relief.  He states that he was told he could take 2 tablets at one time but his wife did not want him to do so as she was not sure how he would react to it.  States he has also been taking Tylenol with mild relief.  Patient reports the pain is exacerbated with movement.  He denies any fever, chills, drainage from incision sites, redness around incision sites, nausea, vomiting, diarrhea, constipation, urinary symptoms, any other associated symptoms.  Surgical history includes cholecystectomy and bilateral inguinal hernia repair.       Past Medical History:  Diagnosis Date  . Adenomatous colon polyp 10/2007  . Angina   . Arthritis   . COPD (chronic obstructive pulmonary disease) ([place])   . Gallstones   . GERD (gastroesophageal reflux disease)   . Headache(784.0)   . High cholesterol   . Hyperthyroidism     Patient Active Problem List   Diagnosis Date Noted  . Chronic neck pain 02/27/2015  . Hyperthyroidism 02/27/2015  . TIA (transient ischemic attack) 02/27/2015  . COPD (chronic  obstructive pulmonary disease) ([place])   . Tobacco use disorder   . Cholecystitis with cholelithiasis. Lap chole 09/28/2011. 09/28/2011  . GERD (gastroesophageal reflux disease) 07/29/2011    Past Surgical History:  Procedure Laterality Date  . ANTERIOR CERVICAL DECOMP/DISCECTOMY FUSION  1994   S/P neck fracture  . BLADDER NECK RECONSTRUCTION    . CARDIAC CATHETERIZATION    . [place]   left  . CHOLECYSTECTOMY  09/28/2011   Procedure: LAPAROSCOPIC CHOLECYSTECTOMY WITH INTRAOPERATIVE CHOLANGIOGRAM;  Surgeon: Shann Medal, MD;  Location: [place];  Service: General;  Laterality: N/A;  . ESOPHAGOGASTRODUODENOSCOPY  03/02/2011   Procedure: ESOPHAGOGASTRODUODENOSCOPY (EGD);  Surgeon: Jamesetta So;  [facility];  Service: Gastroenterology;  Laterality: N/A;  . HAND SURGERY  1980's   "got it crushed"  . TONSILLECTOMY AND ADENOIDECTOMY  1960's       Family History  Problem Relation Age of Onset  . Cancer Mother   . Malignant hyperthermia Mother   . Cancer Father        brain tumor/?prostate  . Malignant hyperthermia Father   . Cancer Sister   . Malignant hyperthermia Sister   . Cancer Son        breast  . Cancer Paternal Aunt        breast    Social History   Tobacco Use  . Smoking status: Current Every Day Smoker  Packs/day: 1.00    Years: 34.00    Pack years: 34.00    Types: Cigarettes  . Smokeless tobacco: Former Systems developer    Types: Chew    Quit date: 06/27/1970  Substance Use Topics  . Alcohol use: No  . Drug use: No    Home Medications Prior to Admission medications   Medication Sig Start Date End Date Taking? Authorizing Provider  acetaminophen (TYLENOL) 500 MG tablet Take 1,000 mg by mouth every 6 (six) hours as needed. For pain    Yes [provider]  aspirin 325 MG tablet Take 1 tablet (325 mg total) by mouth daily. 02/27/15  Yes Samuella Cota, MD  atorvastatin (LIPITOR) 40 MG tablet Take 1 tablet (40 mg total) by mouth  daily at 6 PM. 02/27/15  Yes Samuella Cota, MD  cyclobenzaprine (FLEXERIL) 10 MG tablet Take 1 tablet (10 mg total) by mouth 2 (two) times daily as needed for muscle spasms. 05/18/19  Yes Dorie Rank, MD  meloxicam (MOBIC) 7.5 MG tablet Take 1 tablet (7.5 mg total) by mouth daily. Patient taking differently: Take 7.5 mg by mouth daily as needed for pain.  05/18/19  Yes Dorie Rank, MD  methimazole (TAPAZOLE) 10 MG tablet Take 10 mg by mouth daily.    Yes [provider]  doxycycline (VIBRAMYCIN) 100 MG capsule Take 1 capsule (100 mg total) by mouth 2 (two) times daily. One po bid x 7 days Patient not taking: Reported on 06/13/2019 07/12/18   Milton Ferguson, MD  HYDROcodone-acetaminophen (NORCO/VICODIN) 5-325 MG tablet Take 1 tablet by mouth every 6 (six) hours as needed. Patient not taking: Reported on 06/13/2019 05/18/19   Dorie Rank, MD  ibuprofen (ADVIL,MOTRIN) 600 MG tablet Take 1 tablet (600 mg total) by mouth every 6 (six) hours as needed. Patient not taking: Reported on 07/12/2018 05/06/18   Evalee Jefferson, PA-C  naproxen (NAPROSYN) 500 MG tablet Take 1 tablet (500 mg total) by mouth 2 (two) times daily with a meal. Patient not taking: Reported on 06/13/2019 11/02/18   Veryl Speak, MD    Allergies    Codeine, Tylox [oxycodone-acetaminophen], and Oxycodone-acetaminophen  Review of Systems   Review of Systems  Constitutional: Negative for chills and fever.  HENT: Negative for congestion.   Respiratory: Negative for cough and shortness of breath.   Cardiovascular: Negative for chest pain.  Gastrointestinal: Positive for abdominal pain. Negative for blood in stool, constipation, diarrhea, nausea and vomiting.  All other systems reviewed and are negative.   Physical Exam Updated Vital Signs BP 129/87 (BP Location: Right Arm)   Pulse 67   Temp 97.8 F (36.6 C) (Oral)   Resp 16   SpO2 98%   Physical Exam Vitals and nursing note reviewed.  Constitutional:      Appearance:  He is obese. He is not ill-appearing or diaphoretic.  HENT:     Head: Normocephalic and atraumatic.  Eyes:     Conjunctiva/sclera: Conjunctivae normal.  Cardiovascular:     Rate and Rhythm: Normal rate and regular rhythm.     Pulses: Normal pulses.  Pulmonary:     Effort: Pulmonary effort is normal.     Breath sounds: Wheezing present. No rhonchi or rales.  Abdominal:     Palpations: Abdomen is soft.     Tenderness: There is abdominal tenderness. There is guarding (voluntary). There is no right CVA tenderness, left CVA tenderness or rebound.     Comments: 4 laparoscopic incision sites noted to abdomen in the  RLQ, LLQ, LUQ, and above umbilicus; well healing without redness or drainage. No ecchymosis noted to abdomen. Abdomen is soft. + TTP to RLQ along incision site with voluntary guarding. No rebound tenderness.   Musculoskeletal:     Cervical back: Neck supple.  Skin:    General: Skin is warm and dry.  Neurological:     Mental Status: He is alert.     ED Results / Procedures / Treatments   Labs (all labs ordered are listed, but only abnormal results are displayed) Labs Reviewed  COMPREHENSIVE METABOLIC PANEL - Abnormal; Notable for the following components:      Result Value   Alkaline Phosphatase 23 (*)    All other components within normal limits  CBC WITH DIFFERENTIAL/PLATELET - Abnormal; Notable for the following components:   MCV 101.3 (*)    MCH 34.1 (*)    Eosinophils Absolute 0.6 (*)    All other components within normal limits  URINALYSIS, ROUTINE W REFLEX MICROSCOPIC - Abnormal; Notable for the following components:   Hgb urine dipstick SMALL (*)    All other components within normal limits  LACTIC ACID, PLASMA  LIPASE, BLOOD    EKG None  Radiology CT Abdomen Pelvis W Contrast  Result Date: 06/13/2019 CLINICAL DATA:  Right lower quadrant abdominal pain, fever. Recent hernia surgery EXAM: CT ABDOMEN AND PELVIS WITH CONTRAST TECHNIQUE: Multidetector CT  imaging of the abdomen and pelvis was performed using the standard protocol following bolus administration of intravenous contrast. CONTRAST:  132mL OMNIPAQUE IOHEXOL 300 MG/ML  SOLN COMPARISON:  09/28/2011 FINDINGS: Lower chest: 3 mm right lower lobe pulmonary nodule (series 5, image 4), stable from prior, benign. No acute findings within the lower chest or lung bases. Hepatobiliary: Slightly lobulated 10 mm low-density lesion within the superior aspect of the left hepatic lobe, which may reflect cyst versus hemangioma liver appears otherwise unremarkable. Gallbladder is surgically absent. No biliary dilatation. Pancreas: Unremarkable. No pancreatic ductal dilatation or surrounding inflammatory changes. Spleen: Normal in size without focal abnormality. Adrenals/Urinary Tract: 9 mm indeterminate density right adrenal gland nodule. 1.8 cm superior pole left renal cyst. Additional subcentimeter bilateral low-density renal lesions, too small to definitively characterize, but most likely represent cysts. No hydronephrosis. Ureters are nondilated. Mildly thickened appearance of the anterior aspect of the urinary bladder wall which is adjacent to anterior abdominal wall surgical site. Stomach/Bowel: There is enteric contrast throughout the small bowel which reaches the cecum. There is borderline dilation of a few loops of small bowel within the central abdomen measuring up to 3.1 cm in diameter which may reflect ileus. A loop of small bowel bulges toward the right inguinal ring. There is a normal retrocecal air-filled appendix without periappendiceal inflammatory changes. Large bowel is within normal limits without focal thickening or pericolonic inflammatory changes. Stomach is unremarkable. Vascular/Lymphatic: Aortic atherosclerosis. No enlarged abdominal or pelvic lymph nodes. Reproductive: Prostate is unremarkable. Other: Extensive postsurgical changes along the lower anterior abdominal wall. There is some fluid and  fat density within the bilateral inguinal canals with associated stranding. Hernia sacs are significantly reduced compared to prior CT 09/28/2011. Musculoskeletal: No acute or significant osseous findings. IMPRESSION: 1. Borderline dilation of a few loops of small bowel within the central abdomen measuring up to 3.1 cm in diameter, suggesting ileus. Early developing obstruction is a possibility, although there is no discrete transition point and contrast is present within the cecum. 2. Postsurgical changes along the lower anterior abdominal wall. There is some fluid and fat density within  the bilateral inguinal canals with associated stranding suggesting postoperative seromas versus hematomas. Hernia sacs are significantly reduced compared to prior CT 09/28/2011. 3. Normal appendix. 4. 9 mm indeterminate right adrenal gland nodule. No dedicated follow-up imaging based on lesion size. 5. Aortic atherosclerosis. Aortic Atherosclerosis (ICD10-I70.0). Electronically Signed   By: Davina Poke M.D.   On: 06/13/2019 16:24    Procedures Procedures (including critical care time)  Medications Ordered in ED Medications  HYDROmorphone (DILAUDID) injection 1 mg (1 mg Intravenous Given 06/13/19 1226)  iohexol (OMNIPAQUE) 300 MG/ML solution 100 mL (100 mLs Intravenous Contrast Given 06/13/19 1548)    ED Course  I have reviewed the triage vital signs and the nursing notes.  Pertinent labs & imaging results that were available during my care of the patient were reviewed by me and considered in my medical decision making (see chart for details).  68 year old male presents the ED complaining of right lower quadrant pain near incision site status post bilateral inguinal hernia repair 2 weeks ago at the [facility] in [place].  States he had pain immediately after the procedure and told nursing staff but was told to follow-up in 2 weeks for reevaluation.  States he has been unable to get into contact with him presenting to  the ED instead.  Patient denies any nausea, vomiting, diarrhea.  He is tolerating p.o. at home without difficulty.  Last bowel movement yesterday.  Denies fever or chills.  No drainage, erythema to incision sites.  His vitals are stable today.  He is afebrile without tachycardia or tachypnea.  He does have tenderness to the right lower quadrant just above his incision site.  Will obtain CT abdomen pelvis at this time and work-up for infection.  Patient given Dilaudid for pain.   CBC without leukocytosis.  Hemoglobin stable.  CMP without electrolyte abnormalities.  Creatinine stable compared to baseline.  LFTs within normal limits.  Lipase negative.  Urinalysis without infection.  Lactic acid within normal limits.  Awaiting CT at this time.  CT with ileus but contrast all the way through to the cecum; doubt SBO given findings. Pt passing gas and having bowel movements without difficulty. He has been able to eat and drink at home as well without nausea or vomiting. CT scan with post surgical fluids with concern for seroma vs hematoma which is to be expected post surgery. Discussed this with attending physician Dr. Roderic Palau who agrees patient can be discharged home with close follow up with the [facility] in [facility] who did his surgery. I had lengthy discussion with patient regarding findings. Have advised he take Tylenol as needed for pain and if he has break through pain to then take the 15 mg oxycodone he has been prescribed; do not feel this is helping with his ileus. He is strongly encouraged to go to the [facility] to be seen even if they are not answering their phones; he needs to be reevaluated by his surgeon. He is in agreement with plan at this time and stable for discharge.   This note was prepared using Dragon voice recognition software and may include unintentional dictation errors due to the inherent limitations of voice recognition software.     MDM Rules/Calculators/A&P                       Final  Clinical Impression(s) / ED Diagnoses Final diagnoses:  Post-operative pain  Ileus ([place])    Rx / DC Orders ED Discharge Orders  None         Eustaquio Maize, PA-C 06/13/19 1640    Long, Wonda Old[place], MD 06/13/19 [phone]

## 2019-06-13 NOTE — Discharge Instructions (Addendum)
Your CT scan did show some slowing of your bowels but was otherwise unremarkable. Please follow up with your surgeon at the [facility] in [place]. Call them and if they do not answer please go to them and request to be seen.   Take Tylenol as needed for pain and if you have break through pain you can take the 15 mg Oxycodone prescribed to you. You can take 1,000 mg Tylenol every 8 hours AS NEEDED for pain (Do not exceed 3,000 mg!) If you take 325 mg tylenol you can take 3 at a time every 8 hours. If you take 500 mg tylenol you can take 2 at a time every 8 hours.

## 2019-06-13 NOTE — ED Triage Notes (Signed)
Pain in right lower quadrant- states he had hernia surgery  2 weeks ago at the [facility], unable to contact [facility]  For problems

## 2019-12-23 DIAGNOSIS — J449 Chronic obstructive pulmonary disease, unspecified: Secondary | ICD-10-CM | POA: Diagnosis not present

## 2019-12-23 DIAGNOSIS — H2513 Age-related nuclear cataract, bilateral: Secondary | ICD-10-CM | POA: Diagnosis not present

## 2019-12-23 DIAGNOSIS — H18523 Epithelial (juvenile) corneal dystrophy, bilateral: Secondary | ICD-10-CM | POA: Diagnosis not present

## 2019-12-23 DIAGNOSIS — E0500 Thyrotoxicosis with diffuse goiter without thyrotoxic crisis or storm: Secondary | ICD-10-CM | POA: Diagnosis not present

## 2020-02-05 DIAGNOSIS — H2513 Age-related nuclear cataract, bilateral: Secondary | ICD-10-CM | POA: Diagnosis not present

## 2020-02-05 DIAGNOSIS — H18523 Epithelial (juvenile) corneal dystrophy, bilateral: Secondary | ICD-10-CM | POA: Diagnosis not present

## 2020-02-05 DIAGNOSIS — E0500 Thyrotoxicosis with diffuse goiter without thyrotoxic crisis or storm: Secondary | ICD-10-CM | POA: Diagnosis not present

## 2020-03-30 DIAGNOSIS — Z9889 Other specified postprocedural states: Secondary | ICD-10-CM | POA: Diagnosis not present

## 2020-03-30 DIAGNOSIS — H18523 Epithelial (juvenile) corneal dystrophy, bilateral: Secondary | ICD-10-CM | POA: Diagnosis not present

## 2020-03-30 DIAGNOSIS — H2513 Age-related nuclear cataract, bilateral: Secondary | ICD-10-CM | POA: Diagnosis not present

## 2020-03-30 DIAGNOSIS — E0500 Thyrotoxicosis with diffuse goiter without thyrotoxic crisis or storm: Secondary | ICD-10-CM | POA: Diagnosis not present

## 2020-04-01 DIAGNOSIS — H5711 Ocular pain, right eye: Secondary | ICD-10-CM | POA: Diagnosis not present

## 2020-04-02 DIAGNOSIS — S0502XA Injury of conjunctiva and corneal abrasion without foreign body, left eye, initial encounter: Secondary | ICD-10-CM | POA: Diagnosis not present

## 2020-04-02 DIAGNOSIS — H18523 Epithelial (juvenile) corneal dystrophy, bilateral: Secondary | ICD-10-CM | POA: Diagnosis not present

## 2020-09-26 ENCOUNTER — Emergency Department (HOSPITAL_COMMUNITY)
Admission: EM | Admit: 2020-09-26 | Discharge: 2020-09-26 | Disposition: A | Discharge status: Home / Self Care | Payer: No Typology Code available for payment source | Attending: Emergency Medicine | Admitting: Emergency Medicine

## 2020-09-26 ENCOUNTER — Encounter (HOSPITAL_COMMUNITY): Payer: Self-pay | Admitting: Emergency Medicine

## 2020-09-26 ENCOUNTER — Other Ambulatory Visit: Payer: Self-pay

## 2020-09-26 DIAGNOSIS — J449 Chronic obstructive pulmonary disease, unspecified: Secondary | ICD-10-CM | POA: Insufficient documentation

## 2020-09-26 DIAGNOSIS — Z79899 Other long term (current) drug therapy: Secondary | ICD-10-CM | POA: Insufficient documentation

## 2020-09-26 DIAGNOSIS — R300 Dysuria: Secondary | ICD-10-CM | POA: Insufficient documentation

## 2020-09-26 DIAGNOSIS — N3091 Cystitis, unspecified with hematuria: Secondary | ICD-10-CM

## 2020-09-26 DIAGNOSIS — Z7982 Long term (current) use of aspirin: Secondary | ICD-10-CM | POA: Insufficient documentation

## 2020-09-26 DIAGNOSIS — R310 Gross hematuria: Secondary | ICD-10-CM

## 2020-09-26 DIAGNOSIS — R319 Hematuria, unspecified: Secondary | ICD-10-CM | POA: Diagnosis not present

## 2020-09-26 DIAGNOSIS — F17210 Nicotine dependence, cigarettes, uncomplicated: Secondary | ICD-10-CM | POA: Insufficient documentation

## 2020-09-26 LAB — URINALYSIS, ROUTINE W REFLEX MICROSCOPIC
Bacteria, UA: NONE SEEN
RBC / HPF: >50 RBC/hpf — ABNORMAL HIGH (ref 0–5)
WBC, UA: >50 WBC/hpf — ABNORMAL HIGH (ref 0–5)

## 2020-09-26 MED ORDER — CEPHALEXIN 500 MG PO CAPS: 500.0000 mg | ORAL_CAPSULE | Freq: Four times a day (QID) | ORAL | 0 refills | Status: DC

## 2020-09-26 MED ORDER — CEPHALEXIN 500 MG PO CAPS
1000.0000 mg | ORAL_CAPSULE | Freq: Once | ORAL | Status: AC
  Administered 2020-09-26: 1000 mg via ORAL
  Filled 2020-09-26: qty 2

## 2020-09-26 NOTE — ED Notes (Signed)
Patient seen in the ED tonight for hemorrhagic cystitis, discharged home, VSS, ambulatory with steady gait. Patient and wife verbalized understanding of discharge instructions, medications and return parameters.

## 2020-09-26 NOTE — Discharge Instructions (Signed)
It was our pleasure to provide your ER care today - we hope that you feel better.  Drink plenty of fluids/stay well hydrated.   Take keflex (antibiotic) as prescribed.   Follow up with urologist this week - call office Monday AM to arrange appointment.   Return to ER if worse, new symptoms, fevers, unable to void, severe abdominal or flank pain, persistent vomiting, or other concern.

## 2020-09-26 NOTE — ED Provider Notes (Signed)
[facility] EMERGENCY DEPARTMENT Provider Note   CSN: [id number] Arrival date & time: 09/26/20  2059     History Chief Complaint  Patient presents with  . Hematuria    Gerald Salas. is a 70 y.o. male.  Patient presents with hematuria, and dysuria, symptoms acute onset today, moderate, constant, persistent.  Pt feels he is able to empty bladder. No back or flank pain. No abd pain. No fever or chills. No scrotal or testicular pain or swelling. No hx uti. +hx kidney stone, but that presented with severe flank pain then, and no pain today. Denies any other abnormal bruising or bleeding. No nosebleeds, no rectal bleeding. No faintness or dizziness.   The history is provided by the patient and the spouse.  Hematuria Pertinent negatives include no chest pain, no abdominal pain and no shortness of breath.       Past Medical History:  Diagnosis Date  . Adenomatous colon polyp 10/2007  . Angina   . Arthritis   . COPD (chronic obstructive pulmonary disease) ([place])   . Gallstones   . GERD (gastroesophageal reflux disease)   . Headache(784.0)   . High cholesterol   . Hyperthyroidism     Patient Active Problem List   Diagnosis Date Noted  . Chronic neck pain 02/27/2015  . Hyperthyroidism 02/27/2015  . TIA (transient ischemic attack) 02/27/2015  . COPD (chronic obstructive pulmonary disease) ([place])   . Tobacco use disorder   . Cholecystitis with cholelithiasis. Lap chole 09/28/2011. 09/28/2011  . GERD (gastroesophageal reflux disease) 07/29/2011    Past Surgical History:  Procedure Laterality Date  . ANTERIOR CERVICAL DECOMP/DISCECTOMY FUSION  1994   S/P neck fracture  . BLADDER NECK RECONSTRUCTION    . CARDIAC CATHETERIZATION    . [place]   left  . CHOLECYSTECTOMY  09/28/2011   Procedure: LAPAROSCOPIC CHOLECYSTECTOMY WITH INTRAOPERATIVE CHOLANGIOGRAM;  Surgeon: Shann Medal, MD;  Location: [place];  Service: General;  Laterality: N/A;  .  ESOPHAGOGASTRODUODENOSCOPY  03/02/2011   Procedure: ESOPHAGOGASTRODUODENOSCOPY (EGD);  Surgeon: Jamesetta So;  [facility];  Service: Gastroenterology;  Laterality: N/A;  . HAND SURGERY  1980's   "got it crushed"  . HERNIA REPAIR    . TONSILLECTOMY AND ADENOIDECTOMY  1960's       Family History  Problem Relation Age of Onset  . Cancer Mother   . Malignant hyperthermia Mother   . Cancer Father        brain tumor/?prostate  . Malignant hyperthermia Father   . Cancer Sister   . Malignant hyperthermia Sister   . Cancer Son        breast  . Cancer Paternal Aunt        breast    Social History   Tobacco Use  . Smoking status: Current Every Day Smoker    Packs/day: 1.00    Years: 34.00    Pack years: 34.00    Types: Cigarettes  . Smokeless tobacco: Former Systems developer    Types: [place] date: 06/27/1970  Vaping Use  . Vaping Use: Never used  Substance Use Topics  . Alcohol use: No  . Drug use: No    Home Medications Prior to Admission medications   Medication Sig Start Date End Date Taking? Authorizing Provider  acetaminophen (TYLENOL) 500 MG tablet Take 1,000 mg by mouth every 6 (six) hours as needed. For pain     [provider]  aspirin 325 MG  tablet Take 1 tablet (325 mg total) by mouth daily. 02/27/15   Samuella Cota, MD  atorvastatin (LIPITOR) 40 MG tablet Take 1 tablet (40 mg total) by mouth daily at 6 PM. 02/27/15   Samuella Cota, MD  cyclobenzaprine (FLEXERIL) 10 MG tablet Take 1 tablet (10 mg total) by mouth 2 (two) times daily as needed for muscle spasms. 05/18/19   Dorie Rank, MD  doxycycline (VIBRAMYCIN) 100 MG capsule Take 1 capsule (100 mg total) by mouth 2 (two) times daily. One po bid x 7 days Patient not taking: Reported on 06/13/2019 07/12/18   Milton Ferguson, MD  HYDROcodone-acetaminophen (NORCO/VICODIN) 5-325 MG tablet Take 1 tablet by mouth every 6 (six) hours as needed. Patient not taking: Reported on 06/13/2019 05/18/19    Dorie Rank, MD  ibuprofen (ADVIL,MOTRIN) 600 MG tablet Take 1 tablet (600 mg total) by mouth every 6 (six) hours as needed. Patient not taking: Reported on 07/12/2018 05/06/18   Evalee Jefferson, PA-C  meloxicam (MOBIC) 7.5 MG tablet Take 1 tablet (7.5 mg total) by mouth daily. Patient taking differently: Take 7.5 mg by mouth daily as needed for pain.  05/18/19   Dorie Rank, MD  methimazole (TAPAZOLE) 10 MG tablet Take 10 mg by mouth daily.     [provider]  naproxen (NAPROSYN) 500 MG tablet Take 1 tablet (500 mg total) by mouth 2 (two) times daily with a meal. Patient not taking: Reported on 06/13/2019 11/02/18   Veryl Speak, MD    Allergies    Codeine, Tylox [oxycodone-acetaminophen], and Oxycodone-acetaminophen  Review of Systems   Review of Systems  Constitutional: Negative for chills and fever.  HENT: Negative for nosebleeds and sore throat.   Eyes: Negative for redness.  Respiratory: Negative for shortness of breath.   Cardiovascular: Negative for chest pain.  Gastrointestinal: Negative for abdominal pain, nausea and vomiting.  Genitourinary: Positive for dysuria and hematuria. Negative for flank pain, scrotal swelling and testicular pain.  Musculoskeletal: Negative for back pain.  Skin: Negative for rash.  Neurological: Negative for light-headedness.  Hematological: Does not bruise/bleed easily.  Psychiatric/Behavioral: Negative for confusion.    Physical Exam Updated Vital Signs BP (!) 135/91   Pulse 76   Temp 97.8 F (36.6 C)   Resp 18   Ht 1.803 m (5\' 11" )   Wt 88 kg   SpO2 99%   BMI 27.06 kg/m   Physical Exam Vitals and nursing note reviewed.  Constitutional:      Appearance: Normal appearance. He is well-developed.  HENT:     Head: Atraumatic.     Nose: Nose normal.     Mouth/Throat:     Mouth: Mucous membranes are moist.  Eyes:     General: No scleral icterus.    Conjunctiva/sclera: Conjunctivae normal.  Neck:     Trachea: No tracheal  deviation.  Cardiovascular:     Rate and Rhythm: Normal rate.     Pulses: Normal pulses.  Pulmonary:     Effort: Pulmonary effort is normal. No accessory muscle usage or respiratory distress.  Abdominal:     General: Bowel sounds are normal. There is no distension.     Palpations: Abdomen is soft. There is no mass.     Tenderness: There is no abdominal tenderness. There is no guarding or rebound.     Hernia: No hernia is present.  Genitourinary:    Comments: No cva tenderness. Normal external gu exam.  Musculoskeletal:        General:  No swelling.     Cervical back: Neck supple.  Skin:    General: Skin is warm and dry.     Findings: No rash.  Neurological:     Mental Status: He is alert.     Comments: Alert, speech clear.   Psychiatric:        Mood and Affect: Mood normal.     ED Results / Procedures / Treatments   Labs (all labs ordered are listed, but only abnormal results are displayed) Labs Reviewed  URINALYSIS, ROUTINE W REFLEX MICROSCOPIC - Abnormal; Notable for the following components:      Result Value   Color, Urine RED (*)    APPearance CLOUDY (*)    Glucose, UA   (*)    Value: TEST NOT REPORTED DUE TO COLOR INTERFERENCE OF URINE PIGMENT   Hgb urine dipstick   (*)    Value: TEST NOT REPORTED DUE TO COLOR INTERFERENCE OF URINE PIGMENT   Bilirubin Urine   (*)    Value: TEST NOT REPORTED DUE TO COLOR INTERFERENCE OF URINE PIGMENT   Ketones, ur   (*)    Value: TEST NOT REPORTED DUE TO COLOR INTERFERENCE OF URINE PIGMENT   Protein, ur   (*)    Value: TEST NOT REPORTED DUE TO COLOR INTERFERENCE OF URINE PIGMENT   Nitrite   (*)    Value: TEST NOT REPORTED DUE TO COLOR INTERFERENCE OF URINE PIGMENT   Leukocytes,Ua   (*)    Value: TEST NOT REPORTED DUE TO COLOR INTERFERENCE OF URINE PIGMENT   RBC / HPF >50 (*)    WBC, UA >50 (*)    All other components within normal limits  URINE CULTURE    EKG None  Radiology No results found.  Procedures Procedures    Medications Ordered in ED Medications  cephALEXin (KEFLEX) capsule 1,000 mg (has no administration in time range)    ED Course  I have reviewed the triage vital signs and the nursing notes.  Pertinent labs & imaging results that were available during my care of the patient were reviewed by me and considered in my medical decision making (see chart for details).    MDM Rules/Calculators/A&P                         Labs.   Reviewed nursing notes and prior charts for additional history.   Labs reviewed/interpreted by me - pt with > 50 wbc and rbcs, felt c/w hemorrhagic cystitis.   Pt does have dysuria, and has no abd or flank pain or tenderness. No nv. He feels he is able to empty bladder.   Urine culture sent. Keflex 1 gm po.   Pt currently appears stable for d/c.   Rec urology f/u.  Return precautions provided.     Final Clinical Impression(s) / ED Diagnoses Final diagnoses:  None    Rx / DC Orders ED Discharge Orders    None       Lajean Saver, MD 09/26/20 2314

## 2020-09-26 NOTE — ED Triage Notes (Signed)
Pt c/o hematuria today. Has hx of kidney stones. Pt c/o incontinence and burning with urination

## 2020-09-27 ENCOUNTER — Encounter (HOSPITAL_COMMUNITY): Payer: Self-pay

## 2020-09-27 ENCOUNTER — Emergency Department (HOSPITAL_COMMUNITY)
Admission: EM | Admit: 2020-09-27 | Discharge: 2020-09-28 | Disposition: A | Discharge status: Home / Self Care | Payer: No Typology Code available for payment source | Attending: Emergency Medicine | Admitting: Emergency Medicine

## 2020-09-27 DIAGNOSIS — N3091 Cystitis, unspecified with hematuria: Secondary | ICD-10-CM | POA: Diagnosis not present

## 2020-09-27 DIAGNOSIS — F17210 Nicotine dependence, cigarettes, uncomplicated: Secondary | ICD-10-CM | POA: Insufficient documentation

## 2020-09-27 DIAGNOSIS — E039 Hypothyroidism, unspecified: Secondary | ICD-10-CM | POA: Diagnosis not present

## 2020-09-27 DIAGNOSIS — R319 Hematuria, unspecified: Secondary | ICD-10-CM | POA: Diagnosis present

## 2020-09-27 DIAGNOSIS — J449 Chronic obstructive pulmonary disease, unspecified: Secondary | ICD-10-CM | POA: Diagnosis not present

## 2020-09-27 NOTE — ED Triage Notes (Signed)
Pt here from home; was here yesterday for same, states that he is having painful urination. Was given abx. Michela Pitcher it hurts more now

## 2020-09-28 ENCOUNTER — Emergency Department (HOSPITAL_COMMUNITY): Payer: No Typology Code available for payment source

## 2020-09-28 LAB — CBC WITH DIFFERENTIAL/PLATELET
Abs Immature Granulocytes: 0.04 10*3/uL (ref 0.00–0.07)
Basophils Absolute: 0 10*3/uL (ref 0.0–0.1)
Basophils Relative: 0 %
Eosinophils Absolute: 0.5 10*3/uL (ref 0.0–0.5)
Eosinophils Relative: 5 %
HCT: 41.8 % (ref 39.0–52.0)
Hemoglobin: 14.4 g/dL (ref 13.0–17.0)
Immature Granulocytes: 0 %
Lymphocytes Relative: 28 %
Lymphs Abs: 2.8 10*3/uL (ref 0.7–4.0)
MCH: 34.7 pg — ABNORMAL HIGH (ref 26.0–34.0)
MCHC: 34.4 g/dL (ref 30.0–36.0)
MCV: 100.7 fL — ABNORMAL HIGH (ref 80.0–100.0)
Monocytes Absolute: 0.8 10*3/uL (ref 0.1–1.0)
Monocytes Relative: 8 %
Neutro Abs: 5.9 10*3/uL (ref 1.7–7.7)
Neutrophils Relative %: 59 %
Platelets: 166 10*3/uL (ref 150–400)
RBC: 4.15 MIL/uL — ABNORMAL LOW (ref 4.22–5.81)
RDW: 12.2 % (ref 11.5–15.5)
WBC: 10 10*3/uL (ref 4.0–10.5)
nRBC: 0 % (ref 0.0–0.2)

## 2020-09-28 LAB — BASIC METABOLIC PANEL
Anion gap: 9 (ref 5–15)
BUN: 26 mg/dL — ABNORMAL HIGH (ref 8–23)
CO2: 22 mmol/L (ref 22–32)
Calcium: 9 mg/dL (ref 8.9–10.3)
Chloride: 109 mmol/L (ref 98–111)
Creatinine, Ser: 1.51 mg/dL — ABNORMAL HIGH (ref 0.61–1.24)
GFR, Estimated: 50 mL/min — ABNORMAL LOW (ref >60)
Glucose, Bld: 118 mg/dL — ABNORMAL HIGH (ref 70–99)
Potassium: 3.5 mmol/L (ref 3.5–5.1)
Sodium: 140 mmol/L (ref 135–145)

## 2020-09-28 LAB — URINE CULTURE

## 2020-09-28 MED ORDER — PHENAZOPYRIDINE HCL 100 MG PO TABS
100.0000 mg | ORAL_TABLET | Freq: Once | ORAL | Status: AC
  Administered 2020-09-28: 100 mg via ORAL
  Filled 2020-09-28: qty 1

## 2020-09-28 MED ORDER — HYDROCODONE-ACETAMINOPHEN 5-325 MG PO TABS
1.0000 | ORAL_TABLET | Freq: Once | ORAL | Status: AC
  Administered 2020-09-28: 1 via ORAL
  Filled 2020-09-28: qty 1

## 2020-09-28 NOTE — ED Provider Notes (Signed)
[facility] EMERGENCY DEPARTMENT Provider Note   CSN: [id number] Arrival date & time: 09/27/20  2025     History Chief Complaint  Patient presents with  . Hematuria    Gerald Ohm. is a 70 y.o. male.  Patient returns with pain with urination, dysuria and hematuria since yesterday.  He was seen yesterday and diagnosed with hemorrhagic cystitis.  He was given antibiotics which she has been taking.  He comes in today because he has persistent pain when he urinates and gross hematuria.  He denies any back or flank pain.  No abdominal pain.  No fever or chills.  No testicular pain.  He has had kidney stones in the past but has had flank pain with those.  He does feel like he is able to empty his bladder completely but has pain when he tries to urinate.  He says the pain is worse than yesterday and is only there when he tries to pee.  He denies any black or bloody stools.  The history is provided by the patient.  Hematuria Pertinent negatives include no chest pain, no abdominal pain and no headaches.       Past Medical History:  Diagnosis Date  . Adenomatous colon polyp 10/2007  . Angina   . Arthritis   . COPD (chronic obstructive pulmonary disease) ([place])   . Gallstones   . GERD (gastroesophageal reflux disease)   . Headache(784.0)   . High cholesterol   . Hyperthyroidism     Patient Active Problem List   Diagnosis Date Noted  . Chronic neck pain 02/27/2015  . Hyperthyroidism 02/27/2015  . TIA (transient ischemic attack) 02/27/2015  . COPD (chronic obstructive pulmonary disease) ([place])   . Tobacco use disorder   . Cholecystitis with cholelithiasis. Lap chole 09/28/2011. 09/28/2011  . GERD (gastroesophageal reflux disease) 07/29/2011    Past Surgical History:  Procedure Laterality Date  . ANTERIOR CERVICAL DECOMP/DISCECTOMY FUSION  1994   S/P neck fracture  . BLADDER NECK RECONSTRUCTION    . CARDIAC CATHETERIZATION    . [place]   left  .  CHOLECYSTECTOMY  09/28/2011   Procedure: LAPAROSCOPIC CHOLECYSTECTOMY WITH INTRAOPERATIVE CHOLANGIOGRAM;  Surgeon: Shann Medal, MD;  Location: [place];  Service: General;  Laterality: N/A;  . ESOPHAGOGASTRODUODENOSCOPY  03/02/2011   Procedure: ESOPHAGOGASTRODUODENOSCOPY (EGD);  Surgeon: Jamesetta So;  [facility];  Service: Gastroenterology;  Laterality: N/A;  . HAND SURGERY  1980's   "got it crushed"  . HERNIA REPAIR    . TONSILLECTOMY AND ADENOIDECTOMY  1960's       Family History  Problem Relation Age of Onset  . Cancer Mother   . Malignant hyperthermia Mother   . Cancer Father        brain tumor/?prostate  . Malignant hyperthermia Father   . Cancer Sister   . Malignant hyperthermia Sister   . Cancer Son        breast  . Cancer Paternal Aunt        breast    Social History   Tobacco Use  . Smoking status: Current Every Day Smoker    Packs/day: 1.00    Years: 34.00    Pack years: 34.00    Types: Cigarettes  . Smokeless tobacco: Former Systems developer    Types: [place] date: 06/27/1970  Vaping Use  . Vaping Use: Never used  Substance Use Topics  . Alcohol use: No  . Drug use: No  Home Medications Prior to Admission medications   Medication Sig Start Date End Date Taking? Authorizing Provider  acetaminophen (TYLENOL) 500 MG tablet Take 1,000 mg by mouth every 6 (six) hours as needed. For pain     [provider]  aspirin 325 MG tablet Take 1 tablet (325 mg total) by mouth daily. 02/27/15   Samuella Cota, MD  atorvastatin (LIPITOR) 40 MG tablet Take 1 tablet (40 mg total) by mouth daily at 6 PM. 02/27/15   Samuella Cota, MD  cephALEXin (KEFLEX) 500 MG capsule Take 1 capsule (500 mg total) by mouth 4 (four) times daily. 09/26/20   Lajean Saver, MD  cyclobenzaprine (FLEXERIL) 10 MG tablet Take 1 tablet (10 mg total) by mouth 2 (two) times daily as needed for muscle spasms. 05/18/19   Dorie Rank, MD  doxycycline (VIBRAMYCIN) 100 MG capsule Take 1  capsule (100 mg total) by mouth 2 (two) times daily. One po bid x 7 days Patient not taking: Reported on 06/13/2019 07/12/18   Milton Ferguson, MD  HYDROcodone-acetaminophen (NORCO/VICODIN) 5-325 MG tablet Take 1 tablet by mouth every 6 (six) hours as needed. Patient not taking: Reported on 06/13/2019 05/18/19   Dorie Rank, MD  ibuprofen (ADVIL,MOTRIN) 600 MG tablet Take 1 tablet (600 mg total) by mouth every 6 (six) hours as needed. Patient not taking: Reported on 07/12/2018 05/06/18   Evalee Jefferson, PA-C  meloxicam (MOBIC) 7.5 MG tablet Take 1 tablet (7.5 mg total) by mouth daily. Patient taking differently: Take 7.5 mg by mouth daily as needed for pain.  05/18/19   Dorie Rank, MD  methimazole (TAPAZOLE) 10 MG tablet Take 10 mg by mouth daily.     [provider]  naproxen (NAPROSYN) 500 MG tablet Take 1 tablet (500 mg total) by mouth 2 (two) times daily with a meal. Patient not taking: Reported on 06/13/2019 11/02/18   Veryl Speak, MD    Allergies    Codeine, Tylox [oxycodone-acetaminophen], and Oxycodone-acetaminophen  Review of Systems   Review of Systems  Constitutional: Negative for activity change, appetite change and fever.  HENT: Negative for congestion and rhinorrhea.   Cardiovascular: Negative for chest pain.  Gastrointestinal: Negative for abdominal pain, nausea and vomiting.  Genitourinary: Positive for difficulty urinating, dysuria and hematuria. Negative for flank pain.  Neurological: Negative for dizziness, weakness and headaches.   all other systems are negative except as noted in the HPI and PMH.    Physical Exam Updated Vital Signs BP (!) 148/74 (BP Location: Right Arm)   Pulse 74   Temp 98.2 F (36.8 C)   Resp 17   SpO2 95%   Physical Exam Vitals and nursing note reviewed.  Constitutional:      General: He is not in acute distress.    Appearance: He is well-developed.  HENT:     Head: Normocephalic and atraumatic.     Mouth/Throat:     Pharynx: No  oropharyngeal exudate.  Eyes:     Conjunctiva/sclera: Conjunctivae normal.     Pupils: Pupils are equal, round, and reactive to light.  Neck:     Comments: No meningismus. Cardiovascular:     Rate and Rhythm: Normal rate and regular rhythm.     Heart sounds: Normal heart sounds. No murmur heard.   Pulmonary:     Effort: Pulmonary effort is normal. No respiratory distress.     Breath sounds: Normal breath sounds.  Abdominal:     Palpations: Abdomen is soft.     Tenderness:  There is no abdominal tenderness. There is no guarding or rebound.  Genitourinary:    Comments: Uncircumcised, no testicular tenderness, no gross blood at meatus. Musculoskeletal:        General: No tenderness. Normal range of motion.     Cervical back: Normal range of motion and neck supple.  Skin:    General: Skin is warm.  Neurological:     Mental Status: He is alert and oriented to person, place, and time.     Cranial Nerves: No cranial nerve deficit.     Motor: No abnormal muscle tone.     Coordination: Coordination normal.     Comments:  5/5 strength throughout. CN 2-12 intact.Equal grip strength.   Psychiatric:        Behavior: Behavior normal.     ED Results / Procedures / Treatments   Labs (all labs ordered are listed, but only abnormal results are displayed) Labs Reviewed  CBC WITH DIFFERENTIAL/PLATELET - Abnormal; Notable for the following components:      Result Value   RBC 4.15 (*)    MCV 100.7 (*)    MCH 34.7 (*)    All other components within normal limits  BASIC METABOLIC PANEL - Abnormal; Notable for the following components:   Glucose, Bld 118 (*)    BUN 26 (*)    Creatinine, Ser 1.51 (*)    GFR, Estimated 50 (*)    All other components within normal limits    EKG None  Radiology CT Renal Stone Study  Result Date: 09/28/2020 CLINICAL DATA:  Hematuria and painful urination. EXAM: CT ABDOMEN AND PELVIS WITHOUT CONTRAST TECHNIQUE: Multidetector CT imaging of the abdomen and  pelvis was performed following the standard protocol without IV contrast. COMPARISON:  06/13/2019 FINDINGS: Lower chest: Small nodules demonstrated in both lung bases, largest measuring 4 mm diameter. Similar nodules were present on the previous study. Hepatobiliary: No focal liver abnormality is seen. Status post cholecystectomy. No biliary dilatation. Pancreas: Unremarkable. No pancreatic ductal dilatation or surrounding inflammatory changes. Spleen: Normal in size without focal abnormality. Adrenals/Urinary Tract: No adrenal gland nodules. Cyst on the left kidney is unchanged since previous study. No renal or ureteral stones. No hydronephrosis or hydroureter. Multiple stones are demonstrated in the bladder. The bladder wall is thickened with stranding in the fat around the bladder suggesting cystitis. Stomach/Bowel: Stomach is within normal limits. Appendix appears normal. No evidence of bowel wall thickening, distention, or inflammatory changes. Vascular/Lymphatic: Aortic atherosclerosis. No enlarged abdominal or pelvic lymph nodes. Reproductive: Prostate gland is enlarged, measuring 5.9 cm diameter. Other: No free air or free fluid in the abdomen. Small bilateral inguinal hernias containing fat. Postoperative changes consistent with mesh hernia repair in the anterior abdominal wall. Musculoskeletal: Degenerative changes in the spine. No destructive bone lesions. IMPRESSION: 1. Multiple stones in the bladder. Diffuse bladder wall thickening and stranding in the fat around the bladder suggesting cystitis. 2. Enlarged prostate gland. 3. Small bilateral inguinal hernias containing fat. 4. Small nodules in both lung bases, largest measuring 4 mm diameter. Similar nodules were present on the previous study. No follow-up needed if patient is low-risk (and has no known or suspected primary neoplasm). Non-contrast chest CT can be considered in 12 months if patient is high-risk. This recommendation follows the consensus  statement: Guidelines for Management of Incidental Pulmonary Nodules Detected on CT Images: From the Fleischner Society 2017; Radiology 2017; 284:228-243. Aortic Atherosclerosis (ICD10-I70.0). Electronically Signed   By: Lucienne Capers M.D.   On: 09/28/2020 00:38  Procedures Procedures   Medications Ordered in ED Medications - No data to display  ED Course  I have reviewed the triage vital signs and the nursing notes.  Pertinent labs & imaging results that were available during my care of the patient were reviewed by me and considered in my medical decision making (see chart for details).    MDM Rules/Calculators/A&P                         Hematuria with frequency, urgency and dysuria.  No abdominal pain or flank pain.  Put on Keflex yesterday for suspected hemorrhagic cystitis.  Urine culture is pending  Bladder scan shows 28 mL.  No evidence of retention.  CT imaging shows diffuse bladder wall thickening with some stones in the bladder.  Enlarged prostate gland.  Lung nodules that patient is aware of.  Creatinine 1.5.  Baseline seems to be about 1.2.  Patient able to empty his bladder.  No evidence of urinary retention.  Suspect hemorrhagic cystitis.  Recommend continue antibiotics Follow-up with PCP as well as urology. Return precautions discussed Final Clinical Impression(s) / ED Diagnoses Final diagnoses:  Hemorrhagic cystitis    Rx / DC Orders ED Discharge Orders    None       Seab Axel, Annie Main, MD 09/28/20 [phone]

## 2020-09-28 NOTE — Discharge Instructions (Addendum)
Take the antibiotics as prescribed and follow-up with the urologist.  Your CT scan showed a lung nodule that needs follow-up in 1 year peer return to the ED with difficulty urinating, pain, fever, vomiting, other concerns.

## 2020-12-23 ENCOUNTER — Other Ambulatory Visit: Payer: Self-pay

## 2020-12-23 ENCOUNTER — Encounter: Payer: Self-pay | Admitting: Urology

## 2020-12-23 ENCOUNTER — Ambulatory Visit (INDEPENDENT_AMBULATORY_CARE_PROVIDER_SITE_OTHER): Payer: No Typology Code available for payment source | Admitting: Urology

## 2020-12-23 VITALS — BP 142/76 | HR 88 | Ht 71.0 in | Wt 194.0 lb

## 2020-12-23 DIAGNOSIS — R310 Gross hematuria: Secondary | ICD-10-CM

## 2020-12-23 DIAGNOSIS — N401 Enlarged prostate with lower urinary tract symptoms: Secondary | ICD-10-CM | POA: Diagnosis not present

## 2020-12-23 DIAGNOSIS — N210 Calculus in bladder: Secondary | ICD-10-CM

## 2020-12-23 NOTE — Progress Notes (Signed)
12/23/2020 2:14 PM   Gerald Salas [birth year] [id number]  Referring provider: Center, [facility] [address] [place],  [place] [address]  Chief Complaint  Patient presents with   Hematuria    HPI: Gerald Salas. [name], [name]. is a 70 y.o. male referred from the [facility] for evaluation and treatment of BPH and bladder calculi due to prolonged wait time at the [place]  Initially evaluated May 2022 for total gross painless hematuria which started April 2022 Complaints of urinary frequency, urgency and urinary hesitancy IPSS 16/35 Had CT which showed multiple bladder calculi and punctate right upper pole renal calculi Flexible cystoscopy performed at [facility] noted with no strictures, lateral lobe enlargement and multiple bladder calculi ranging in size from 0.5-2 cm estimated at ~ 10 calculi No recent gross hematuria On finasteride and tamsulosin Prior history nephrolithiasis >25 years ago Has recently passed for bladder calculi   PMH: Past Medical History:  Diagnosis Date   Adenomatous colon polyp 10/2007   Angina    Arthritis    COPD (chronic obstructive pulmonary disease) ([place])    Gallstones    GERD (gastroesophageal reflux disease)    Headache(784.0)    High cholesterol    Hyperthyroidism     Surgical History: Past Surgical History:  Procedure Laterality Date   ANTERIOR CERVICAL DECOMP/DISCECTOMY FUSION  1994   S/P neck fracture   BLADDER NECK [place]   left   CHOLECYSTECTOMY  09/28/2011   Procedure: LAPAROSCOPIC CHOLECYSTECTOMY WITH INTRAOPERATIVE CHOLANGIOGRAM;  Surgeon: Shann Medal, MD;  Location: [place];  Service: General;  Laterality: N/A;   ESOPHAGOGASTRODUODENOSCOPY  03/02/2011   Procedure: ESOPHAGOGASTRODUODENOSCOPY (EGD);  Surgeon: Jamesetta So;  [facility];  Service: Gastroenterology;  Laterality: N/A;   HAND SURGERY  1980's   "got it crushed"   HERNIA REPAIR     TONSILLECTOMY  AND ADENOIDECTOMY  1960's    Home Medications:  Allergies as of 12/23/2020       Reactions   Codeine Other (See Comments), Itching, Rash   Veins pop out real bad Veins pop out real bad   Tylox [oxycodone-acetaminophen] Itching   Oxycodone-acetaminophen Itching        Medication List        Accurate as of December 23, 2020  2:14 PM. If you have any questions, ask your nurse or doctor.          STOP taking these medications    acetaminophen 500 MG tablet Commonly known as: TYLENOL Stopped by: Abbie Sons, MD   cephALEXin 500 MG capsule Commonly known as: Keflex Stopped by: Abbie Sons, MD   cyclobenzaprine 10 MG tablet Commonly known as: FLEXERIL Stopped by: Abbie Sons, MD   doxycycline 100 MG capsule Commonly known as: VIBRAMYCIN Stopped by: Abbie Sons, MD   HYDROcodone-acetaminophen 5-325 MG tablet Commonly known as: NORCO/VICODIN Stopped by: Abbie Sons, MD   ibuprofen 600 MG tablet Commonly known as: ADVIL Stopped by: Abbie Sons, MD   meloxicam 7.5 MG tablet Commonly known as: Mobic Stopped by: Abbie Sons, MD   naproxen 500 MG tablet Commonly known as: NAPROSYN Stopped by: Abbie Sons, MD       TAKE these medications    aspirin 325 MG tablet Take 1 tablet (325 mg total) by mouth daily.   atorvastatin 40 MG tablet Commonly known as: LIPITOR Take 1  tablet (40 mg total) by mouth daily at 6 PM.   finasteride 5 MG tablet Commonly known as: PROSCAR Take 5 mg by mouth daily.   methimazole 10 MG tablet Commonly known as: TAPAZOLE Take 10 mg by mouth daily.   tamsulosin 0.4 MG Caps capsule Commonly known as: FLOMAX Take 0.4 mg by mouth.        Allergies:  Allergies  Allergen Reactions   Codeine Other (See Comments), Itching and Rash    Veins pop out real bad Veins pop out real bad   Tylox [Oxycodone-Acetaminophen] Itching   Oxycodone-Acetaminophen Itching    Family History: Family History   Problem Relation Age of Onset   Cancer Mother    Malignant hyperthermia Mother    Cancer Father        brain tumor/?prostate   Malignant hyperthermia Father    Cancer Sister    Malignant hyperthermia Sister    Cancer Son        breast   Cancer Paternal Aunt        breast    Social History:  reports that he has been smoking cigarettes. He has a 34.00 pack-year smoking history. He quit smokeless tobacco use about 50 years ago.  His smokeless tobacco use included chew. He reports that he does not drink alcohol and does not use drugs.   Physical Exam: There were no vitals taken for this visit.  Constitutional:  Alert and oriented, No acute distress. HEENT: [place] AT, moist mucus membranes.  Trachea midline, no masses. Cardiovascular: No clubbing, cyanosis, or edema. Respiratory: Normal respiratory effort, no increased work of breathing. GI: Abdomen is soft, nontender, nondistended, no abdominal masses GU: Prostate 35-40 g, smooth without nodules Skin: No rashes, bruises or suspicious lesions. Neurologic: Grossly intact, no focal deficits, moving all 4 extremities. Psychiatric: Normal mood and affect.  Laboratory Data:  Urinalysis UA pending  Pertinent Imaging: CT images personally reviewed and interpreted.  Sagittal view not included but estimated prostate volume ~ at 90 cc  CT Renal Stone Study  Narrative CLINICAL DATA:  Hematuria and painful urination.  EXAM: CT ABDOMEN AND PELVIS WITHOUT CONTRAST  TECHNIQUE: Multidetector CT imaging of the abdomen and pelvis was performed following the standard protocol without IV contrast.  COMPARISON:  06/13/2019  FINDINGS: Lower chest: Small nodules demonstrated in both lung bases, largest measuring 4 mm diameter. Similar nodules were present on the previous study.  Hepatobiliary: No focal liver abnormality is seen. Status post cholecystectomy. No biliary dilatation.  Pancreas: Unremarkable. No pancreatic ductal dilatation  or surrounding inflammatory changes.  Spleen: Normal in size without focal abnormality.  Adrenals/Urinary Tract: No adrenal gland nodules. Cyst on the left kidney is unchanged since previous study. No renal or ureteral stones. No hydronephrosis or hydroureter. Multiple stones are demonstrated in the bladder. The bladder wall is thickened with stranding in the fat around the bladder suggesting cystitis.  Stomach/Bowel: Stomach is within normal limits. Appendix appears normal. No evidence of bowel wall thickening, distention, or inflammatory changes.  Vascular/Lymphatic: Aortic atherosclerosis. No enlarged abdominal or pelvic lymph nodes.  Reproductive: Prostate gland is enlarged, measuring 5.9 cm diameter.  Other: No free air or free fluid in the abdomen. Small bilateral inguinal hernias containing fat. Postoperative changes consistent with mesh hernia repair in the anterior abdominal wall.  Musculoskeletal: Degenerative changes in the spine. No destructive bone lesions.  IMPRESSION: 1. Multiple stones in the bladder. Diffuse bladder wall thickening and stranding in the fat around the bladder suggesting cystitis. 2.  Enlarged prostate gland. 3. Small bilateral inguinal hernias containing fat. 4. Small nodules in both lung bases, largest measuring 4 mm diameter. Similar nodules were present on the previous study. No follow-up needed if patient is low-risk (and has no known or suspected primary neoplasm). Non-contrast chest CT can be considered in 12 months if patient is high-risk. This recommendation follows the consensus statement: Guidelines for Management of Incidental Pulmonary Nodules Detected on CT Images: From the Fleischner Society 2017; Radiology 2017; 284:228-243.  Aortic Atherosclerosis (ICD10-I70.0).   Electronically Signed By: Lucienne Capers M.D. On: 09/28/2020 00:38   Assessment & Plan:    1.  Bladder calculi Multiple bladder calculi We discussed  cystolitholapaxy though based on prostate size and lower urinary tract symptoms I also recommended an outlet procedure and based on size feel HoLEP would be the best option Discussed with Dr. Diamantina Providence and will schedule HoLEP cystolitholapaxy and he will see Dr. Diamantina Providence for a preop visit  2.  BPH with LUTS As above  3. Gross hematuria Secondary to bladder calculi   Abbie Sons, MD  [facility] [address] [place],  [address] [phone]

## 2020-12-23 NOTE — H&P (View-Only) (Signed)
12/23/2020 2:14 PM   Gerald Salas August 02, 1950 [id number]  Referring provider: Center, [facility] [address] [place],  [place] [address]  Chief Complaint  Patient presents with   Hematuria    HPI: Gerald Tickner. Nevaeh, [place]. is a 70 y.o. male referred from the [facility] for evaluation and treatment of BPH and bladder calculi due to prolonged wait time at the [place]  Initially evaluated May 2022 for total gross painless hematuria which started April 2022 Complaints of urinary frequency, urgency and urinary hesitancy IPSS 16/35 Had CT which showed multiple bladder calculi and punctate right upper pole renal calculi Flexible cystoscopy performed at [facility] noted with no strictures, lateral lobe enlargement and multiple bladder calculi ranging in size from 0.5-2 cm estimated at ~ 10 calculi No recent gross hematuria On finasteride and tamsulosin Prior history nephrolithiasis >25 years ago Has recently passed for bladder calculi   PMH: Past Medical History:  Diagnosis Date   Adenomatous colon polyp 10/2007   Angina    Arthritis    COPD (chronic obstructive pulmonary disease) ([place])    Gallstones    GERD (gastroesophageal reflux disease)    Headache(784.0)    High cholesterol    Hyperthyroidism     Surgical History: Past Surgical History:  Procedure Laterality Date   ANTERIOR CERVICAL DECOMP/DISCECTOMY FUSION  1994   S/P neck fracture   BLADDER NECK [place]   left   CHOLECYSTECTOMY  09/28/2011   Procedure: LAPAROSCOPIC CHOLECYSTECTOMY WITH INTRAOPERATIVE CHOLANGIOGRAM;  Surgeon: Shann Medal, MD;  Location: [place];  Service: General;  Laterality: N/A;   ESOPHAGOGASTRODUODENOSCOPY  03/02/2011   Procedure: ESOPHAGOGASTRODUODENOSCOPY (EGD);  Surgeon: Jamesetta So;  [facility];  Service: Gastroenterology;  Laterality: N/A;   HAND SURGERY  1980's   "got it crushed"   HERNIA REPAIR     TONSILLECTOMY  AND ADENOIDECTOMY  1960's    Home Medications:  Allergies as of 12/23/2020       Reactions   Codeine Other (See Comments), Itching, Rash   Veins pop out real bad Veins pop out real bad   Tylox [oxycodone-acetaminophen] Itching   Oxycodone-acetaminophen Itching        Medication List        Accurate as of December 23, 2020  2:14 PM. If you have any questions, ask your nurse or doctor.          STOP taking these medications    acetaminophen 500 MG tablet Commonly known as: TYLENOL Stopped by: Abbie Sons, MD   cephALEXin 500 MG capsule Commonly known as: Keflex Stopped by: Abbie Sons, MD   cyclobenzaprine 10 MG tablet Commonly known as: FLEXERIL Stopped by: Abbie Sons, MD   doxycycline 100 MG capsule Commonly known as: VIBRAMYCIN Stopped by: Abbie Sons, MD   HYDROcodone-acetaminophen 5-325 MG tablet Commonly known as: NORCO/VICODIN Stopped by: Abbie Sons, MD   ibuprofen 600 MG tablet Commonly known as: ADVIL Stopped by: Abbie Sons, MD   meloxicam 7.5 MG tablet Commonly known as: Mobic Stopped by: Abbie Sons, MD   naproxen 500 MG tablet Commonly known as: NAPROSYN Stopped by: Abbie Sons, MD       TAKE these medications    aspirin 325 MG tablet Take 1 tablet (325 mg total) by mouth daily.   atorvastatin 40 MG tablet Commonly known as: LIPITOR Take 1  tablet (40 mg total) by mouth daily at 6 PM.   finasteride 5 MG tablet Commonly known as: PROSCAR Take 5 mg by mouth daily.   methimazole 10 MG tablet Commonly known as: TAPAZOLE Take 10 mg by mouth daily.   tamsulosin 0.4 MG Caps capsule Commonly known as: FLOMAX Take 0.4 mg by mouth.        Allergies:  Allergies  Allergen Reactions   Codeine Other (See Comments), Itching and Rash    Veins pop out real bad Veins pop out real bad   Tylox [Oxycodone-Acetaminophen] Itching   Oxycodone-Acetaminophen Itching    Family History: Family History   Problem Relation Age of Onset   Cancer Mother    Malignant hyperthermia Mother    Cancer Father        brain tumor/?prostate   Malignant hyperthermia Father    Cancer Sister    Malignant hyperthermia Sister    Cancer Son        breast   Cancer Paternal Aunt        breast    Social History:  reports that he has been smoking cigarettes. He has a 34.00 pack-year smoking history. He quit smokeless tobacco use about 50 years ago.  His smokeless tobacco use included chew. He reports that he does not drink alcohol and does not use drugs.   Physical Exam: There were no vitals taken for this visit.  Constitutional:  Alert and oriented, No acute distress. HEENT: [place] AT, moist mucus membranes.  Trachea midline, no masses. Cardiovascular: No clubbing, cyanosis, or edema. Respiratory: Normal respiratory effort, no increased work of breathing. GI: Abdomen is soft, nontender, nondistended, no abdominal masses GU: Prostate 35-40 g, smooth without nodules Skin: No rashes, bruises or suspicious lesions. Neurologic: Grossly intact, no focal deficits, moving all 4 extremities. Psychiatric: Normal mood and affect.  Laboratory Data:  Urinalysis UA pending  Pertinent Imaging: CT images personally reviewed and interpreted.  Sagittal view not included but estimated prostate volume ~ at 90 cc  CT Renal Stone Study  Narrative CLINICAL DATA:  Hematuria and painful urination.  EXAM: CT ABDOMEN AND PELVIS WITHOUT CONTRAST  TECHNIQUE: Multidetector CT imaging of the abdomen and pelvis was performed following the standard protocol without IV contrast.  COMPARISON:  06/13/2019  FINDINGS: Lower chest: Small nodules demonstrated in both lung bases, largest measuring 4 mm diameter. Similar nodules were present on the previous study.  Hepatobiliary: No focal liver abnormality is seen. Status post cholecystectomy. No biliary dilatation.  Pancreas: Unremarkable. No pancreatic ductal dilatation  or surrounding inflammatory changes.  Spleen: Normal in size without focal abnormality.  Adrenals/Urinary Tract: No adrenal gland nodules. Cyst on the left kidney is unchanged since previous study. No renal or ureteral stones. No hydronephrosis or hydroureter. Multiple stones are demonstrated in the bladder. The bladder wall is thickened with stranding in the fat around the bladder suggesting cystitis.  Stomach/Bowel: Stomach is within normal limits. Appendix appears normal. No evidence of bowel wall thickening, distention, or inflammatory changes.  Vascular/Lymphatic: Aortic atherosclerosis. No enlarged abdominal or pelvic lymph nodes.  Reproductive: Prostate gland is enlarged, measuring 5.9 cm diameter.  Other: No free air or free fluid in the abdomen. Small bilateral inguinal hernias containing fat. Postoperative changes consistent with mesh hernia repair in the anterior abdominal wall.  Musculoskeletal: Degenerative changes in the spine. No destructive bone lesions.  IMPRESSION: 1. Multiple stones in the bladder. Diffuse bladder wall thickening and stranding in the fat around the bladder suggesting cystitis. 2.  Enlarged prostate gland. 3. Small bilateral inguinal hernias containing fat. 4. Small nodules in both lung bases, largest measuring 4 mm diameter. Similar nodules were present on the previous study. No follow-up needed if patient is low-risk (and has no known or suspected primary neoplasm). Non-contrast chest CT can be considered in 12 months if patient is high-risk. This recommendation follows the consensus statement: Guidelines for Management of Incidental Pulmonary Nodules Detected on CT Images: From the Fleischner Society 2017; Radiology 2017; 284:228-243.  Aortic Atherosclerosis (ICD10-I70.0).   Electronically Signed By: Lucienne Capers M.D. On: 09/28/2020 00:38   Assessment & Plan:    1.  Bladder calculi Multiple bladder calculi We discussed  cystolitholapaxy though based on prostate size and lower urinary tract symptoms I also recommended an outlet procedure and based on size feel HoLEP would be the best option Discussed with Dr. Diamantina Providence and will schedule HoLEP cystolitholapaxy and he will see Dr. Diamantina Providence for a preop visit  2.  BPH with LUTS As above  3. Gross hematuria Secondary to bladder calculi   Abbie Sons, MD  [facility] [address] [place], [place] [address] [phone]

## 2020-12-24 LAB — URINALYSIS, COMPLETE
Bilirubin, UA: NEGATIVE
Ketones, UA: NEGATIVE
Nitrite, UA: NEGATIVE
Specific Gravity, UA: 1.025 (ref 1.005–1.030)
Urobilinogen, Ur: 1 mg/dL (ref 0.2–1.0)
pH, UA: 5 (ref 5.0–7.5)

## 2020-12-24 LAB — MICROSCOPIC EXAMINATION: WBC, UA: >30 /hpf — AB (ref 0–5)

## 2020-12-25 ENCOUNTER — Telehealth: Payer: Self-pay | Admitting: Urology

## 2020-12-25 NOTE — Telephone Encounter (Signed)
LMOM for pt. To call me back and schedule a preop visit with Dr. Diamantina Providence to discuss Holep. (Per Dr. Bernardo Heater) I made an appointment for Dr. Doristine Counter first available in our [place] office. We can change the appointment if this does not work for pt.

## 2021-01-07 ENCOUNTER — Encounter: Payer: Self-pay | Admitting: Urology

## 2021-01-07 ENCOUNTER — Other Ambulatory Visit: Payer: Medicare PPO

## 2021-01-07 ENCOUNTER — Other Ambulatory Visit: Payer: Self-pay

## 2021-01-07 ENCOUNTER — Ambulatory Visit (INDEPENDENT_AMBULATORY_CARE_PROVIDER_SITE_OTHER): Payer: Medicare PPO | Admitting: Urology

## 2021-01-07 ENCOUNTER — Other Ambulatory Visit: Payer: Self-pay | Admitting: Urology

## 2021-01-07 VITALS — BP 132/71 | HR 86 | Ht 71.0 in | Wt 187.7 lb

## 2021-01-07 DIAGNOSIS — N210 Calculus in bladder: Secondary | ICD-10-CM

## 2021-01-07 DIAGNOSIS — N138 Other obstructive and reflux uropathy: Secondary | ICD-10-CM | POA: Diagnosis not present

## 2021-01-07 DIAGNOSIS — N401 Enlarged prostate with lower urinary tract symptoms: Secondary | ICD-10-CM | POA: Diagnosis not present

## 2021-01-07 DIAGNOSIS — Z01818 Encounter for other preprocedural examination: Secondary | ICD-10-CM

## 2021-01-07 NOTE — Progress Notes (Signed)
   01/07/2021 10:07 AM   Gerald Salas. 1951-01-18 [id number]  Reason for visit: BPH, bladder stones  HPI: 70 year old male with BPH and multiple bladder stones who was previously seen by Dr. Bernardo Heater.  IPSS score was 16, with complaints of urinary frequency, urgency, and incontinence.  CT showed multiple bladder stones, and flexible cystoscopy at the [facility] showed no strictures, lateral lobe enlargement, and multiple bladder stones with around 10 stones in maximum size of 2 cm.  He is on maximal medical therapy with finasteride and Flomax.  Prostate measured 90 g on CT.  He was referred to discuss cystolitholapaxy and HOLEP.  We discussed the risks and benefits of HoLEP at length.  The procedure requires general anesthesia and takes 1 to 2 hours, and a holmium laser is used to enucleate the prostate and push this tissue into the bladder.  A morcellator is then used to remove this tissue, which is sent for pathology.  The vast majority(>95%) of patients are able to discharge the same day with a catheter in place for 2 to 3 days, and will follow-up in clinic for a voiding trial.  We specifically discussed the risks of bleeding, infection, retrograde ejaculation, temporary urgency and urge incontinence, very low risk of long-term incontinence, urethral stricture/bladder neck contracture, pathologic evaluation of prostate tissue and possible detection of prostate cancer or other malignancy, and possible need for additional procedures.  Schedule cystolitholapaxy and [place], MD  [facility] [address] [place], [place] [address] [phone]

## 2021-01-07 NOTE — Patient Instructions (Signed)

## 2021-01-08 ENCOUNTER — Telehealth: Payer: Self-pay

## 2021-01-08 LAB — URINALYSIS, ROUTINE W REFLEX MICROSCOPIC
Bilirubin, UA: NEGATIVE
Ketones, UA: NEGATIVE
Nitrite, UA: NEGATIVE
Protein,UA: NEGATIVE
Specific Gravity, UA: 1.015 (ref 1.005–1.030)
Urobilinogen, Ur: 1 mg/dL (ref 0.2–1.0)
pH, UA: 5.5 (ref 5.0–7.5)

## 2021-01-08 LAB — MICROSCOPIC EXAMINATION
Renal Epithel, UA: NONE SEEN /hpf
WBC, UA: >30 /hpf — AB (ref 0–5)

## 2021-01-08 MED ORDER — SULFAMETHOXAZOLE-TRIMETHOPRIM 800-160 MG PO TABS: 1.0000 | ORAL_TABLET | Freq: Two times a day (BID) | ORAL | 0 refills | Status: DC

## 2021-01-08 NOTE — Telephone Encounter (Signed)
Left patient a detailed message notifying him that medication was sent into pharmacy. Mychart message was also sent as notification

## 2021-01-08 NOTE — Telephone Encounter (Signed)
-----   Message from Billey Co, MD sent at 01/08/2021  3:05 PM EDT ----- Start bactrim DS BID x 5 days to sterilize urine before holep, we will call if needs to be adjusted after culture results  Nickolas Madrid, MD 01/08/2021

## 2021-01-09 LAB — URINE CULTURE: Organism ID, Bacteria: NO GROWTH

## 2021-01-11 NOTE — Progress Notes (Signed)
Results sent via my chart 

## 2021-01-11 NOTE — Telephone Encounter (Signed)
[place] and confirmed patient did pick up Bactrim on Friday

## 2021-01-11 NOTE — Telephone Encounter (Signed)
Pt wife calling to advise that pt picked up abx on Friday and started taking then.   ** I have deactivated pt's mychart due to her not being able to "get into it"

## 2021-01-11 NOTE — Telephone Encounter (Signed)
Called and left patient a detailed message notifying him, also called wife's cell number notifying of message per DPR, left detailed message

## 2021-01-12 ENCOUNTER — Inpatient Hospital Stay: Admission: RE | Admit: 2021-01-12 | Payer: Medicare PPO | Source: Ambulatory Visit

## 2021-01-13 ENCOUNTER — Encounter
Admission: RE | Admit: 2021-01-13 | Discharge: 2021-01-13 | Disposition: A | Discharge status: Home / Self Care | Payer: Medicare PPO | Source: Ambulatory Visit | Attending: Urology | Admitting: Urology

## 2021-01-13 ENCOUNTER — Other Ambulatory Visit: Payer: Self-pay

## 2021-01-13 DIAGNOSIS — Z01812 Encounter for preprocedural laboratory examination: Secondary | ICD-10-CM | POA: Diagnosis not present

## 2021-01-13 NOTE — Patient Instructions (Signed)
Your procedure is scheduled on: January 15, 2021 FRIDAY Report to the Registration Desk on the 1st floor of the [facility]. To find out your arrival time, please call [phone] between 1PM - 3PM on: January 14, 2021 Thursday   REMEMBER: Instructions that are not followed completely may result in serious medical risk, up to and including death; or upon the discretion of your surgeon and anesthesiologist your surgery may need to be rescheduled.  DO NOT EAT OR DRINK after midnight the night before surgery.  No gum chewing, lozengers or hard candies.  TAKE THESE MEDICATIONS THE MORNING OF SURGERY WITH A SIP OF WATER: FINASTERIDE TAMSULOSIN  STOP ASPIRIN-LAST DOSE 01/07/2021  One week prior to surgery: Stop Anti-inflammatories (NSAIDS) such as Advil, Aleve, Ibuprofen, Motrin, Naproxen, Naprosyn and Aspirin based products such as Excedrin, Goodys Powder, BC Powder. Stop ANY OVER THE COUNTER supplements until after surgery. You may however, continue to take Tylenol if needed for pain up until the day of surgery.  No Alcohol for 24 hours before or after surgery.  No Smoking including e-cigarettes for 24 hours prior to surgery.  No chewable tobacco products for at least 6 hours prior to surgery.  No nicotine patches on the day of surgery.  Do not use any "recreational" drugs for at least a week prior to your surgery.  Please be advised that the combination of cocaine and anesthesia may have negative outcomes, up to and including death. If you test positive for cocaine, your surgery will be cancelled.  On the morning of surgery brush your teeth with toothpaste and water, you may rinse your mouth with mouthwash if you wish. Do not swallow any toothpaste or mouthwash.  Do not wear jewelry, make-up, hairpins, clips or nail polish.  Do not wear lotions, powders, or perfumes OR DEODORANT   Do not shave body from the neck down 48 hours prior to surgery just in case you cut yourself which could  leave a site for infection.  Also, freshly shaved skin may become irritated if using the CHG soap.  Contact lenses, hearing aids and dentures may not be worn into surgery.  Do not bring valuables to the hospital. [facility] is not responsible for any missing/lost belongings or valuables.   Use CHG Soap as directed on instruction sheet.  Notify your doctor if there is any change in your medical condition (cold, fever, infection).  Wear comfortable clothing (specific to your surgery type) to the hospital.  After surgery, you can help prevent lung complications by doing breathing exercises.  Take deep breaths and cough every 1-2 hours. Your doctor may order a device called an Incentive Spirometer to help you take deep breaths. When coughing or sneezing, hold a pillow firmly against your incision with both hands. This is called "splinting." Doing this helps protect your incision. It also decreases belly discomfort.  If you are being discharged the day of surgery, you will not be allowed to drive home. You will need a responsible adult (18 years or older) to drive you home and stay with you that night.   If you are taking public transportation, you will need to have a responsible adult (18 years or older) with you. Please confirm with your physician that it is acceptable to use public transportation.   Please call the [place] Dept. at [phone] if you have any questions about these instructions.  Surgery Visitation Policy:  Patients undergoing a surgery or procedure may have one family member or  support person with them as long as that person is not COVID-19 positive or experiencing its symptoms.  That person may remain in the waiting area during the procedure.  Inpatient Visitation:    Visiting hours are 7 a.m. to 8 p.m. Inpatients will be allowed two visitors daily. The visitors may change each day during the patient's stay. No visitors under the age of 61. Any  visitor under the age of 44 must be accompanied by an adult. The visitor must pass COVID-19 screenings, use hand sanitizer when entering and exiting the patient's room and wear a mask at all times, including in the patient's room. Patients must also wear a mask when staff or their visitor are in the room. Masking is required regardless of vaccination status.

## 2021-01-15 ENCOUNTER — Ambulatory Visit: Payer: No Typology Code available for payment source | Admitting: Anesthesiology

## 2021-01-15 ENCOUNTER — Encounter
Admission: RE | Disposition: A | Discharge status: Home / Self Care | Payer: Self-pay | Source: Home / Self Care | Attending: Urology

## 2021-01-15 ENCOUNTER — Other Ambulatory Visit: Payer: Self-pay

## 2021-01-15 ENCOUNTER — Ambulatory Visit
Admission: RE | Admit: 2021-01-15 | Discharge: 2021-01-15 | Disposition: A | Discharge status: Home / Self Care | Payer: No Typology Code available for payment source | Attending: Urology | Admitting: Urology

## 2021-01-15 ENCOUNTER — Encounter: Payer: Self-pay | Admitting: Urology

## 2021-01-15 DIAGNOSIS — Z7982 Long term (current) use of aspirin: Secondary | ICD-10-CM | POA: Diagnosis not present

## 2021-01-15 DIAGNOSIS — N3081 Other cystitis with hematuria: Secondary | ICD-10-CM | POA: Insufficient documentation

## 2021-01-15 DIAGNOSIS — N401 Enlarged prostate with lower urinary tract symptoms: Secondary | ICD-10-CM | POA: Insufficient documentation

## 2021-01-15 DIAGNOSIS — Z885 Allergy status to narcotic agent status: Secondary | ICD-10-CM | POA: Insufficient documentation

## 2021-01-15 DIAGNOSIS — N138 Other obstructive and reflux uropathy: Secondary | ICD-10-CM | POA: Insufficient documentation

## 2021-01-15 DIAGNOSIS — Z886 Allergy status to analgesic agent status: Secondary | ICD-10-CM | POA: Insufficient documentation

## 2021-01-15 DIAGNOSIS — F17210 Nicotine dependence, cigarettes, uncomplicated: Secondary | ICD-10-CM | POA: Insufficient documentation

## 2021-01-15 DIAGNOSIS — Z79899 Other long term (current) drug therapy: Secondary | ICD-10-CM | POA: Diagnosis not present

## 2021-01-15 DIAGNOSIS — N342 Other urethritis: Secondary | ICD-10-CM | POA: Insufficient documentation

## 2021-01-15 DIAGNOSIS — N210 Calculus in bladder: Secondary | ICD-10-CM | POA: Insufficient documentation

## 2021-01-15 DIAGNOSIS — N200 Calculus of kidney: Secondary | ICD-10-CM | POA: Insufficient documentation

## 2021-01-15 HISTORY — PX: HOLEP-LASER ENUCLEATION OF THE PROSTATE WITH MORCELLATION: SHX6641

## 2021-01-15 HISTORY — PX: CYSTOSCOPY WITH LITHOLAPAXY: SHX1425

## 2021-01-15 SURGERY — ENUCLEATION, PROSTATE, USING LASER, WITH MORCELLATION
Anesthesia: General

## 2021-01-15 MED ORDER — FENTANYL CITRATE (PF) 100 MCG/2ML IJ SOLN
INTRAMUSCULAR | Status: AC
  Filled 2021-01-15: qty 2

## 2021-01-15 MED ORDER — FAMOTIDINE 20 MG PO TABS
ORAL_TABLET | ORAL | Status: AC
  Filled 2021-01-15: qty 1

## 2021-01-15 MED ORDER — CEFAZOLIN SODIUM-DEXTROSE 2-4 GM/100ML-% IV SOLN
INTRAVENOUS | Status: AC
  Filled 2021-01-15: qty 100

## 2021-01-15 MED ORDER — LACTATED RINGERS IV SOLN: INTRAVENOUS | Status: DC

## 2021-01-15 MED ORDER — TRAMADOL HCL 50 MG PO TABS: 50.0000 mg | ORAL_TABLET | Freq: Four times a day (QID) | ORAL | 0 refills | Status: AC | PRN

## 2021-01-15 MED ORDER — FAMOTIDINE 20 MG PO TABS
20.0000 mg | ORAL_TABLET | Freq: Once | ORAL | Status: AC
  Administered 2021-01-15: 20 mg via ORAL

## 2021-01-15 MED ORDER — FENTANYL CITRATE (PF) 100 MCG/2ML IJ SOLN: 25.0000 ug | INTRAMUSCULAR | Status: DC | PRN

## 2021-01-15 MED ORDER — ONDANSETRON HCL 4 MG/2ML IJ SOLN
INTRAMUSCULAR | Status: DC | PRN
  Administered 2021-01-15: 4 mg via INTRAVENOUS

## 2021-01-15 MED ORDER — MEPERIDINE HCL 25 MG/ML IJ SOLN: 6.2500 mg | INTRAMUSCULAR | Status: DC | PRN

## 2021-01-15 MED ORDER — TRAMADOL HCL 50 MG PO TABS
ORAL_TABLET | ORAL | Status: AC
  Administered 2021-01-15: 50 mg via ORAL
  Filled 2021-01-15: qty 1

## 2021-01-15 MED ORDER — MIDAZOLAM HCL 2 MG/2ML IJ SOLN
INTRAMUSCULAR | Status: AC
  Filled 2021-01-15: qty 2

## 2021-01-15 MED ORDER — PROPOFOL 10 MG/ML IV BOLUS
INTRAVENOUS | Status: AC
  Filled 2021-01-15: qty 80

## 2021-01-15 MED ORDER — SUGAMMADEX SODIUM 200 MG/2ML IV SOLN
INTRAVENOUS | Status: DC | PRN
  Administered 2021-01-15: 200 mg via INTRAVENOUS

## 2021-01-15 MED ORDER — PROPOFOL 10 MG/ML IV BOLUS
INTRAVENOUS | Status: DC | PRN
  Administered 2021-01-15: 200 mg via INTRAVENOUS

## 2021-01-15 MED ORDER — 0.9 % SODIUM CHLORIDE (POUR BTL) OPTIME
TOPICAL | Status: DC | PRN
  Administered 2021-01-15: 39000 mL

## 2021-01-15 MED ORDER — CEFAZOLIN SODIUM-DEXTROSE 2-4 GM/100ML-% IV SOLN
2.0000 g | INTRAVENOUS | Status: AC
  Administered 2021-01-15: 2 g via INTRAVENOUS

## 2021-01-15 MED ORDER — PHENYLEPHRINE HCL (PRESSORS) 10 MG/ML IV SOLN
INTRAVENOUS | Status: AC
  Filled 2021-01-15: qty 1

## 2021-01-15 MED ORDER — FENTANYL CITRATE (PF) 100 MCG/2ML IJ SOLN
INTRAMUSCULAR | Status: DC | PRN
  Administered 2021-01-15: 100 ug via INTRAVENOUS

## 2021-01-15 MED ORDER — LIDOCAINE HCL (CARDIAC) PF 100 MG/5ML IV SOSY
PREFILLED_SYRINGE | INTRAVENOUS | Status: DC | PRN
  Administered 2021-01-15: 100 mg via INTRAVENOUS

## 2021-01-15 MED ORDER — CHLORHEXIDINE GLUCONATE 0.12 % MT SOLN
OROMUCOSAL | Status: AC
  Filled 2021-01-15: qty 15

## 2021-01-15 MED ORDER — BELLADONNA ALKALOIDS-OPIUM 16.2-60 MG RE SUPP
RECTAL | Status: AC
  Filled 2021-01-15: qty 1

## 2021-01-15 MED ORDER — ONDANSETRON HCL 4 MG/2ML IJ SOLN: 4.0000 mg | Freq: Once | INTRAMUSCULAR | Status: DC | PRN

## 2021-01-15 MED ORDER — ROCURONIUM BROMIDE 100 MG/10ML IV SOLN
INTRAVENOUS | Status: DC | PRN
  Administered 2021-01-15: 10 mg via INTRAVENOUS
  Administered 2021-01-15: 50 mg via INTRAVENOUS
  Administered 2021-01-15: 5 mg via INTRAVENOUS

## 2021-01-15 MED ORDER — TRAMADOL HCL 50 MG PO TABS: 50.0000 mg | ORAL_TABLET | Freq: Four times a day (QID) | ORAL | Status: DC | PRN

## 2021-01-15 MED ORDER — ORAL CARE MOUTH RINSE: 15.0000 mL | Freq: Once | OROMUCOSAL | Status: AC

## 2021-01-15 MED ORDER — CHLORHEXIDINE GLUCONATE 0.12 % MT SOLN
15.0000 mL | Freq: Once | OROMUCOSAL | Status: AC
  Administered 2021-01-15: 15 mL via OROMUCOSAL

## 2021-01-15 SURGICAL SUPPLY — 43 items
ADAPTER IRRIG TUBE 2 SPIKE SOL (ADAPTER) ×4 IMPLANT
ADPR TBG 2 SPK PMP STRL ASCP (ADAPTER) ×2
BAG DRAIN CYSTO-URO LG1000N (MISCELLANEOUS) ×2 IMPLANT
BAG DRN LRG CPC RND TRDRP CNTR (MISCELLANEOUS) ×1
BAG URO DRAIN 4000ML (MISCELLANEOUS) ×2 IMPLANT
CATH FOLEY 3WAY 30CC 24FR (CATHETERS) ×2
CATH URETL 5X70 OPEN END (CATHETERS) ×2 IMPLANT
CATH URTH STD 24FR FL 3W 2 (CATHETERS) ×1 IMPLANT
CNTNR SPEC 2.5X3XGRAD LEK (MISCELLANEOUS)
CONT SPEC 4OZ STER OR WHT (MISCELLANEOUS)
CONT SPEC 4OZ STRL OR WHT (MISCELLANEOUS)
CONTAINER COLLECT MORCELLATR (MISCELLANEOUS) ×1 IMPLANT
CONTAINER SPEC 2.5X3XGRAD LEK (MISCELLANEOUS) IMPLANT
DRAPE UTILITY 15X26 TOWEL STRL (DRAPES) ×1 IMPLANT
ELECT BIVAP BIPO 22/24 DONUT (ELECTROSURGICAL)
ELECTRD BIVAP BIPO 22/24 DONUT (ELECTROSURGICAL) IMPLANT
FIBER LASER FLEXIVA PULSE 550 (Laser) ×2 IMPLANT
FILTER OVERFLOW MORCELLATOR (FILTER) ×1 IMPLANT
GAUZE 4X4 16PLY ~~LOC~~+RFID DBL (SPONGE) ×4 IMPLANT
GLOVE SURG UNDER POLY LF SZ7.5 (GLOVE) ×2 IMPLANT
GOWN STRL REUS W/ TWL LRG LVL3 (GOWN DISPOSABLE) ×1 IMPLANT
GOWN STRL REUS W/ TWL XL LVL3 (GOWN DISPOSABLE) ×1 IMPLANT
GOWN STRL REUS W/TWL LRG LVL3 (GOWN DISPOSABLE) ×2
GOWN STRL REUS W/TWL XL LVL3 (GOWN DISPOSABLE) ×2
HOLDER FOLEY CATH W/STRAP (MISCELLANEOUS) ×2 IMPLANT
IV NS IRRIG 3000ML ARTHROMATIC (IV SOLUTION) ×17 IMPLANT
KIT PROBE TRILOGY 3.9X350 (MISCELLANEOUS) IMPLANT
KIT TURNOVER CYSTO (KITS) ×2 IMPLANT
MANIFOLD NEPTUNE II (INSTRUMENTS) ×1 IMPLANT
MBRN O SEALING YLW 17 FOR INST (MISCELLANEOUS) ×2
MEMBRANE SLNG YLW 17 FOR INST (MISCELLANEOUS) ×1 IMPLANT
MORCELLATOR COLLECT CONTAINER (MISCELLANEOUS) ×2
MORCELLATOR OVERFLOW FILTER (FILTER) ×2
MORCELLATOR ROTATION 4.75 335 (MISCELLANEOUS) ×2 IMPLANT
PACK CYSTO AR (MISCELLANEOUS) ×2 IMPLANT
SET CYSTO W/LG BORE CLAMP LF (SET/KITS/TRAYS/PACK) ×2 IMPLANT
SET IRRIG Y TYPE TUR BLADDER L (SET/KITS/TRAYS/PACK) ×2 IMPLANT
SLEEVE PROTECTION STRL DISP (MISCELLANEOUS) ×4 IMPLANT
SURGILUBE 2OZ TUBE FLIPTOP (MISCELLANEOUS) ×2 IMPLANT
SYR TOOMEY IRRIG 70ML (MISCELLANEOUS) ×2
SYRINGE TOOMEY IRRIG 70ML (MISCELLANEOUS) ×1 IMPLANT
TUBE PUMP MORCELLATOR PIRANHA (TUBING) ×2 IMPLANT
WATER STERILE IRR 1000ML POUR (IV SOLUTION) ×2 IMPLANT

## 2021-01-15 NOTE — Discharge Instructions (Signed)

## 2021-01-15 NOTE — Interval H&P Note (Signed)
UROLOGY H&P UPDATE  Agree with prior H&P dated 12/23/20 by Dr Bernardo Heater.  BPH and urinary symptoms, gross hematuria, multiple bladder stones on CT, prostate measured 90 g.  Cardiac: RRR Lungs: CTA bilaterally  Laterality: N/A  Procedure: HoLEP  Urine: No growth  We discussed the risks and benefits of HoLEP at length.  The procedure requires general anesthesia and takes 1 to 2 hours, and a holmium laser is used to enucleate the prostate and push this tissue into the bladder.  A morcellator is then used to remove this tissue, which is sent for pathology.  The vast majority(>95%) of patients are able to discharge the same day with a catheter in place for 2 to 3 days, and will follow-up in clinic for a voiding trial.  We specifically discussed the risks of bleeding, infection, retrograde ejaculation, temporary urgency and urge incontinence, very low risk of long-term incontinence, urethral stricture/bladder neck contracture, pathologic evaluation of prostate tissue and possible detection of prostate cancer or other malignancy, and possible need for additional procedures.    Billey Co, MD 01/15/2021

## 2021-01-15 NOTE — Op Note (Signed)
Date of procedure: 01/15/21  Preoperative diagnosis:  Bladder stones (>3cm) BPH with obstruction  Postoperative diagnosis:  Same  Procedure: Cystolitholapaxy, >3cm HoLEP (Holmium Laser Enucleation of the Prostate)  Surgeon: Gerald Madrid, MD  Anesthesia: General  Complications: None  Intraoperative findings:  ~12 bladder stones fragmented and removed, largest 3 cm Uncomplicated HOLEP with excellent hemostasis, ureteral orifices and verumontanum intact at conclusion of case  EBL: Minimal  Specimens: Prostate chips  Enucleation time: 30 minutes  Morcellation time: 23 minutes  Intra-op weight: 50g  Drains: 24 French three-way, 60 cc in balloon  Indication: Gerald Nip. is a 70 y.o. patient with enlarged prostate with urinary symptoms and gross hematuria and multiple bladder stones on CT.  After reviewing the management options for treatment, they elected to proceed with the above surgical procedure(s). We have discussed the potential benefits and risks of the procedure, side effects of the proposed treatment, the likelihood of the patient achieving the goals of the procedure, and any potential problems that might occur during the procedure or recuperation.  We specifically discussed the risks of bleeding, infection, hematuria and clot retention, need for additional procedures, possible overnight hospital stay, temporary urgency and incontinence, rare long-term incontinence, and retrograde ejaculation.  Informed consent has been obtained.   Description of procedure:  The patient was taken to the operating room and general anesthesia was induced.  The patient was placed in the dorsal lithotomy position, prepped and draped in the usual sterile fashion, and preoperative antibiotics(Ancef) were administered.  SCDs were placed for DVT prophylaxis.  A preoperative time-out was performed.   Gerald Salas sounds were used to gently dilated the urethra up to 2F. The 27 French  continuous flow resectoscope was inserted into the urethra using the visual obturator  The prostate was large with obstructing lateral lobes. The bladder was thoroughly inspected and notable for mild trabeculations and at least a dozen round yellow bladder stones, the largest measuring 3 cm.  The ureteral orifices were located in orthotopic position.    The laser was set to 1.5 J and 15 Hz and used to methodically fragment all the stones into small pieces which were then evacuated free from the bladder.  This took approximately 40 minutes.  All stone pieces were evacuated from the bladder.  The laser was then set to 2 J and 50 Hz and was used to make a lambda incision just proximal to the verumontanum down to the level of the capsule.  A 6 o'clock incision was then made down to the level of the capsule from the bladder neck to the verumontanum.  The lateral lobes were then incised circumferentially until they were disconnected from the surrounding tissue.  The capsule was examined and laser was used for meticulous hemostasis.    The 87 French resectoscope was then switched out for the 67 French nephroscope and the lobes were morcellated and the tissue sent to pathology.  A 24 French three-way catheter was inserted easily, and 60 cc were placed in the balloon.  Urine was light pink.  The catheter irrigated easily with a Toomey syringe.  CBI was initiated. A belladonna suppository was placed.  The patient tolerated the procedure well without any immediate complications and was extubated and transferred to the recovery room in stable condition.  Urine was clear on fast CBI.  Disposition: Stable to PACU  Plan: Wean CBI in PACU, anticipate discharge home today with void trial in clinic in 2-3 days  And later in in  the bladder neck, back and hands and 90 degrees weekly.  Lateral lobe so of the implant in the bladder Gerald Madrid, MD 01/15/2021

## 2021-01-15 NOTE — Anesthesia Preprocedure Evaluation (Addendum)
Anesthesia Evaluation  Patient identified by MRN, date of birth, ID band Patient awake    History of Anesthesia Complications Negative for: history of anesthetic complications  Airway Mallampati: II  TM Distance: >3 FB     Dental  (+) Edentulous Upper, Edentulous Lower   Pulmonary neg shortness of breath, neg sleep apnea, COPD (rare inhaler use;  denies recent dyspnea),  COPD inhaler, neg recent URI, Current Smoker and Patient abstained from smoking.,    breath sounds clear to auscultation + decreased breath sounds      Cardiovascular Exercise Tolerance: Good (-) angina (Denies anginia-  not cardiac related; able to do 4 METS) Rhythm:Regular Rate:Normal - Systolic murmurs    Neuro/Psych  Headaches, TIA (had right sided weakness that has resolved; no etilogy)negative psych ROS   GI/Hepatic negative GI ROS, Neg liver ROS, neg GERD (Deneis- resolved)  ,  Endo/Other  negative endocrine ROS  Renal/GU negative Renal ROS     Musculoskeletal  (+) Arthritis , Osteoarthritis,    Abdominal Normal abdominal exam  (+)   Peds  Hematology   Anesthesia Other Findings   Reproductive/Obstetrics                             Anesthesia Physical Anesthesia Plan  ASA: 3  Anesthesia Plan: General   Post-op Pain Management:    Induction: Intravenous  PONV Risk Score and Plan: Ondansetron and Dexamethasone  Airway Management Planned: LMA  Additional Equipment:   Intra-op Plan:   Post-operative Plan: Extubation in OR  Informed Consent: I have reviewed the patients History and Physical, chart, labs and discussed the procedure including the risks, benefits and alternatives for the proposed anesthesia with the patient or authorized representative who has indicated his/her understanding and acceptance.       Plan Discussed with: CRNA and Surgeon  Anesthesia Plan Comments: (Patient consented for risks of  anesthesia including but not limited to:  - adverse reactions to medications - damage to eyes, teeth, lips or other oral mucosa - nerve damage due to positioning  - sore throat or hoarseness - Damage to heart, brain, nerves, lungs, other parts of body or loss of life  Patient voiced understanding. )       Anesthesia Quick Evaluation

## 2021-01-15 NOTE — Transfer of Care (Signed)
Immediate Anesthesia Transfer of Care Note  Patient: Gerald Salas.  Procedure(s) Performed: HOLEP-LASER ENUCLEATION OF THE PROSTATE WITH MORCELLATION CYSTOSCOPY WITH LITHOLAPAXY  Patient Location: PACU  Anesthesia Type:General  Level of Consciousness: awake, drowsy and patient cooperative  Airway & Oxygen Therapy: Patient Spontanous Breathing and Patient connected to face mask oxygen  Post-op Assessment: Report given to RN and Post -op Vital signs reviewed and stable  Post vital signs: Reviewed and stable  Last Vitals:  Vitals Value Taken Time  BP 156/81 01/15/21 0924  Temp    Pulse 78 01/15/21 0928  Resp 26 01/15/21 0928  SpO2 99 % 01/15/21 0928  Vitals shown include unvalidated device data.  Last Pain:  Vitals:   01/15/21 0600  TempSrc: Oral  PainSc: 0-No pain         Complications: No notable events documented.

## 2021-01-15 NOTE — Anesthesia Procedure Notes (Signed)
Procedure Name: Intubation Date/Time: 01/15/2021 7:38 AM Performed by: Lowry Bowl, CRNA Pre-anesthesia Checklist: Patient identified, Emergency Drugs available, Suction available and Patient being monitored Patient Re-evaluated:Patient Re-evaluated prior to induction Oxygen Delivery Method: Circle system utilized Preoxygenation: Pre-oxygenation with 100% oxygen Induction Type: IV induction Ventilation: Mask ventilation without difficulty Laryngoscope Size: McGraph and 4 Grade View: Grade I Tube type: Oral Tube size: 7.5 mm Number of attempts: 1 Airway Equipment and Method: Stylet and Video-laryngoscopy (Used electively) Placement Confirmation: ETT inserted through vocal cords under direct vision, positive ETCO2 and breath sounds checked- equal and bilateral Secured at: 21 cm Tube secured with: Tape Dental Injury: Teeth and Oropharynx as per pre-operative assessment

## 2021-01-16 NOTE — Anesthesia Postprocedure Evaluation (Signed)
Anesthesia Post Note  Patient: Gerald Salas.  Procedure(s) Performed: HOLEP-LASER ENUCLEATION OF THE PROSTATE WITH MORCELLATION CYSTOSCOPY WITH LITHOLAPAXY  Patient location during evaluation: PACU Anesthesia Type: General Level of consciousness: awake and alert Pain management: pain level controlled Vital Signs Assessment: post-procedure vital signs reviewed and stable Respiratory status: spontaneous breathing, nonlabored ventilation, respiratory function stable and patient connected to nasal cannula oxygen Cardiovascular status: blood pressure returned to baseline and stable Postop Assessment: no apparent nausea or vomiting Anesthetic complications: no   No notable events documented.   Last Vitals:  Vitals:   01/15/21 1101 01/15/21 1138  BP: 139/72 124/73  Pulse: 62 (!) 59  Resp: 20 18  Temp: (!) 36.3 C (!) 36.3 C  SpO2: 100% 99%    Last Pain:  Vitals:   01/15/21 1138  TempSrc: Temporal  PainSc:                  Tonny Bollman

## 2021-01-18 ENCOUNTER — Ambulatory Visit (INDEPENDENT_AMBULATORY_CARE_PROVIDER_SITE_OTHER): Payer: Medicare PPO | Admitting: Physician Assistant

## 2021-01-18 ENCOUNTER — Other Ambulatory Visit: Payer: Self-pay

## 2021-01-18 ENCOUNTER — Ambulatory Visit: Payer: No Typology Code available for payment source | Admitting: Physician Assistant

## 2021-01-18 DIAGNOSIS — N401 Enlarged prostate with lower urinary tract symptoms: Secondary | ICD-10-CM | POA: Diagnosis not present

## 2021-01-18 DIAGNOSIS — N138 Other obstructive and reflux uropathy: Secondary | ICD-10-CM | POA: Diagnosis not present

## 2021-01-18 LAB — BLADDER SCAN AMB NON-IMAGING

## 2021-01-18 NOTE — Progress Notes (Signed)
Afternoon follow-up  Patient returned to clinic this afternoon for repeat PVR. He reports drinking approximately 36oz of fluid. He has been able to urinate. He has had urinary leakage. PVR 11m.  Results for orders placed or performed in visit on 01/18/21  BLADDER SCAN AMB NON-IMAGING  Result Value Ref Range   Scan Result 868m   Voiding trial passed. Counseled patient on normal postoperative findings including dysuria, gross hematuria, and urinary leakage/urgency.  Counseled him to start Kegel exercises 3x10 sets daily to increase urinary control.  Counseled patient to resume normal fluid intake.  Surgical pathology pending, will defer to Dr. SnDiamantina Providenceo share results when available.  Follow up: 10-week postop visit with Dr. SnDiamantina Providencealready scheduled

## 2021-01-18 NOTE — Patient Instructions (Signed)
Congratulations on your recent HOLEP procedure! As discussed in clinic today, there are three main side effects that commonly occur after surgery: Burning or pain with urination: This typically resolves within 1 week of surgery. If you are still having significant pain with urination 10 days after surgery, please call our clinic. We may need to check you for a urinary tract infection at that point, though this is rare. Blood in the urine: This may come and go, but typically resolves completely within 3 weeks of surgery. If you are on blood thinners, it may take longer for the bleeding to resolve. As long as your urine remains thin and runny and you are not passing large clots (around the size of your palm), this is a normal postoperative finding. If you start to pass dark red urine or thick, ketchup-like urine, please call our office immediately. Urinary leakage or urgency: This tends to improve with time, with most patients becoming dry within around 3 months of surgery. You may wear absorbant underwear or liners for security during this time. To help you get dry faster, please make sure you are completing your Kegel exercises as instructed, with a set of 10 exercises completed up to three times daily.  

## 2021-01-18 NOTE — Progress Notes (Signed)
Catheter Removal  Patient is present today for a catheter removal.  84m of water was drained from the balloon. A 24FR three way foley cath was removed from the bladder no complications were noted . Patient tolerated well.  Performed by: SDebroah Loop PA-C   Follow up/ Additional notes: Push fluids and RTC this afternoon for PVR.

## 2021-01-19 LAB — SURGICAL PATHOLOGY

## 2021-01-21 ENCOUNTER — Telehealth: Payer: Self-pay

## 2021-01-21 NOTE — Telephone Encounter (Signed)
Called pt informed him of the information below. Pt voiced understanding.  

## 2021-01-21 NOTE — Telephone Encounter (Signed)
-----   Message from Billey Co, MD sent at 01/21/2021 12:29 PM EDT ----- Good news, no prostate cancer on HOLEP tissue, keep follow up as scheduled  Nickolas Madrid, MD 01/21/2021

## 2021-03-25 ENCOUNTER — Other Ambulatory Visit: Payer: Self-pay

## 2021-03-25 ENCOUNTER — Encounter: Payer: Self-pay | Admitting: Urology

## 2021-03-25 ENCOUNTER — Ambulatory Visit (INDEPENDENT_AMBULATORY_CARE_PROVIDER_SITE_OTHER): Payer: No Typology Code available for payment source | Admitting: Urology

## 2021-03-25 VITALS — BP 132/77 | HR 77 | Ht 71.0 in | Wt 184.0 lb

## 2021-03-25 DIAGNOSIS — N401 Enlarged prostate with lower urinary tract symptoms: Secondary | ICD-10-CM

## 2021-03-25 DIAGNOSIS — N138 Other obstructive and reflux uropathy: Secondary | ICD-10-CM

## 2021-03-25 LAB — BLADDER SCAN AMB NON-IMAGING

## 2021-03-25 NOTE — Progress Notes (Signed)
   03/25/2021 4:21 PM   Joya Gaskins. Feb 03, 1951 [id number]  Reason for visit: BPH status post HOLEP  HPI: 70 year old male with multiple bladder stones and significant BPH symptoms of weak stream as well as recurrent gross hematuria who underwent an uncomplicated HOLEP on 10/18/9530 with removal of only benign tissue, and multiple bladder stones.  He has been doing well since that time and is urinating with an excellent stream.  PVR is normal at 35 mL.  He is not having any gross hematuria.  He is having mild urge incontinence that continues to improve and is rare.  He is very satisfied with his symptoms at this time.  Postop expectations discussed, RTC 9 months with PVR, can follow-up as needed if doing well at that time   Billey Co, [place] [address], [place], [place] [address] [phone]

## 2021-07-19 IMAGING — DX DG LUMBAR SPINE COMPLETE 4+V
5 series · 5 of 5 positions shown · non-contrast
Comparison: 09/20/2003

CLINICAL DATA: 2 week history of back pain.

EXAM:
LUMBAR SPINE - COMPLETE 4+ VIEW

[l-spine ap]
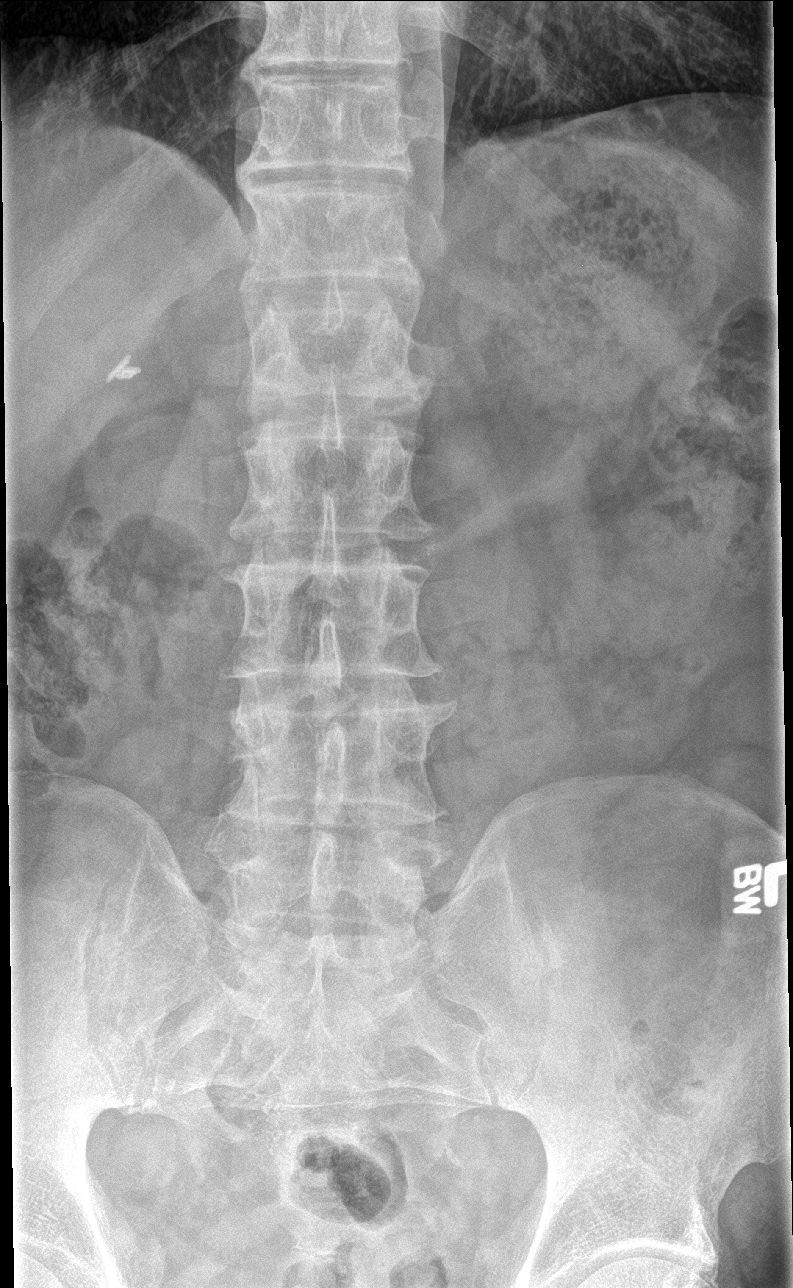

[l-spine obl (1 of 2)]
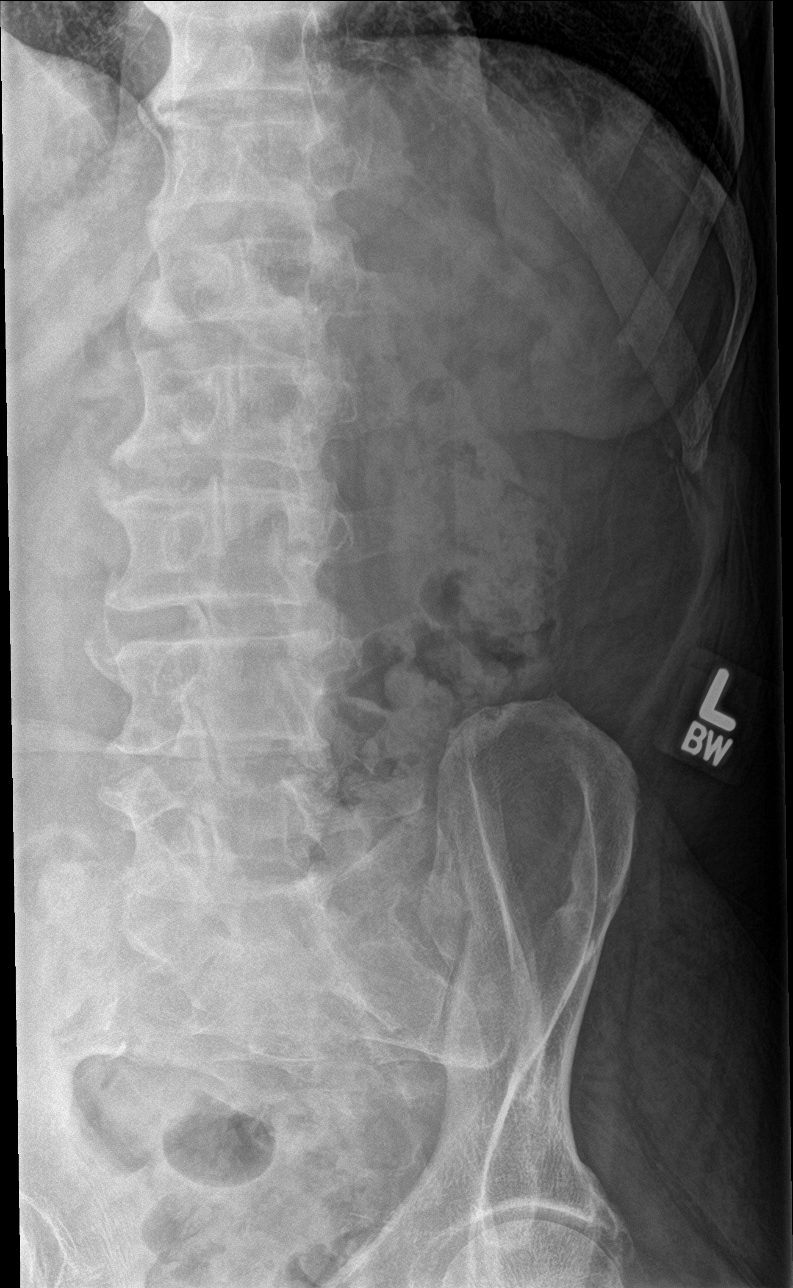

[l-spine obl (2 of 2)]
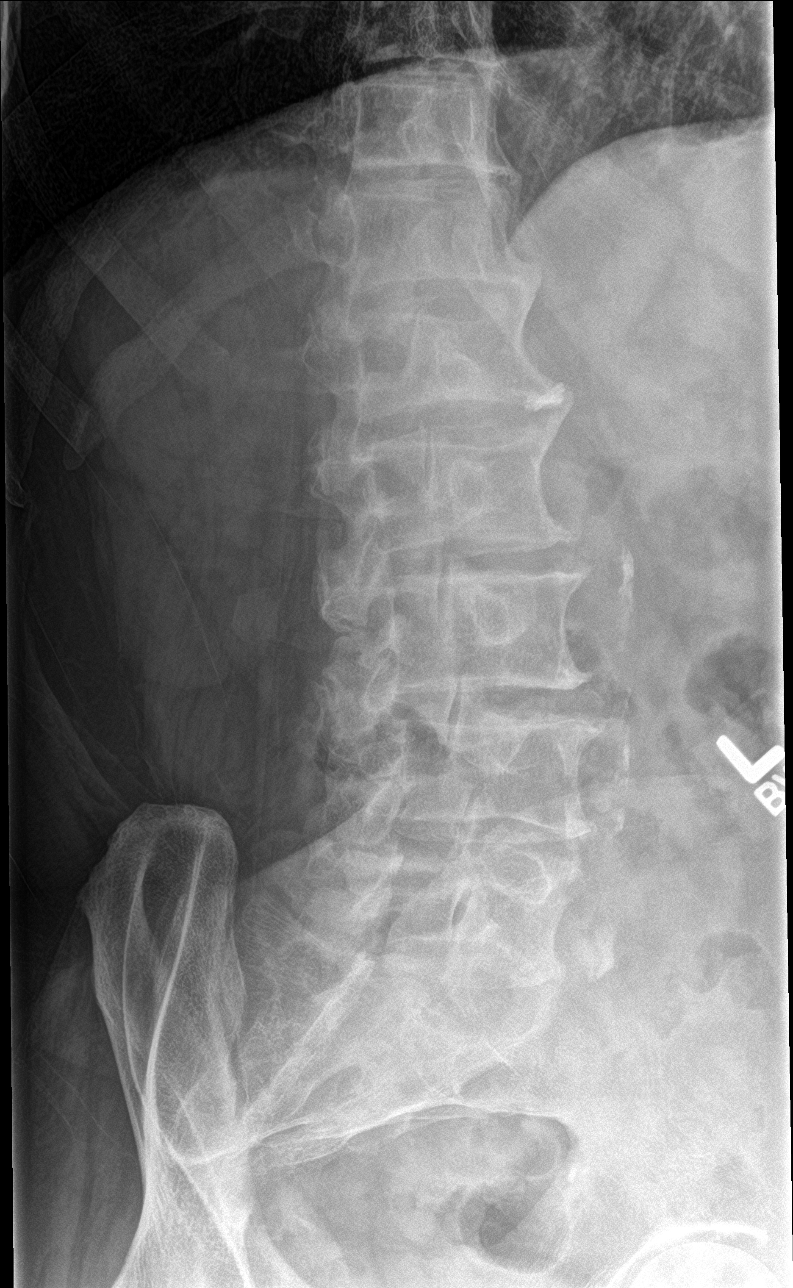

[l-spine lat]
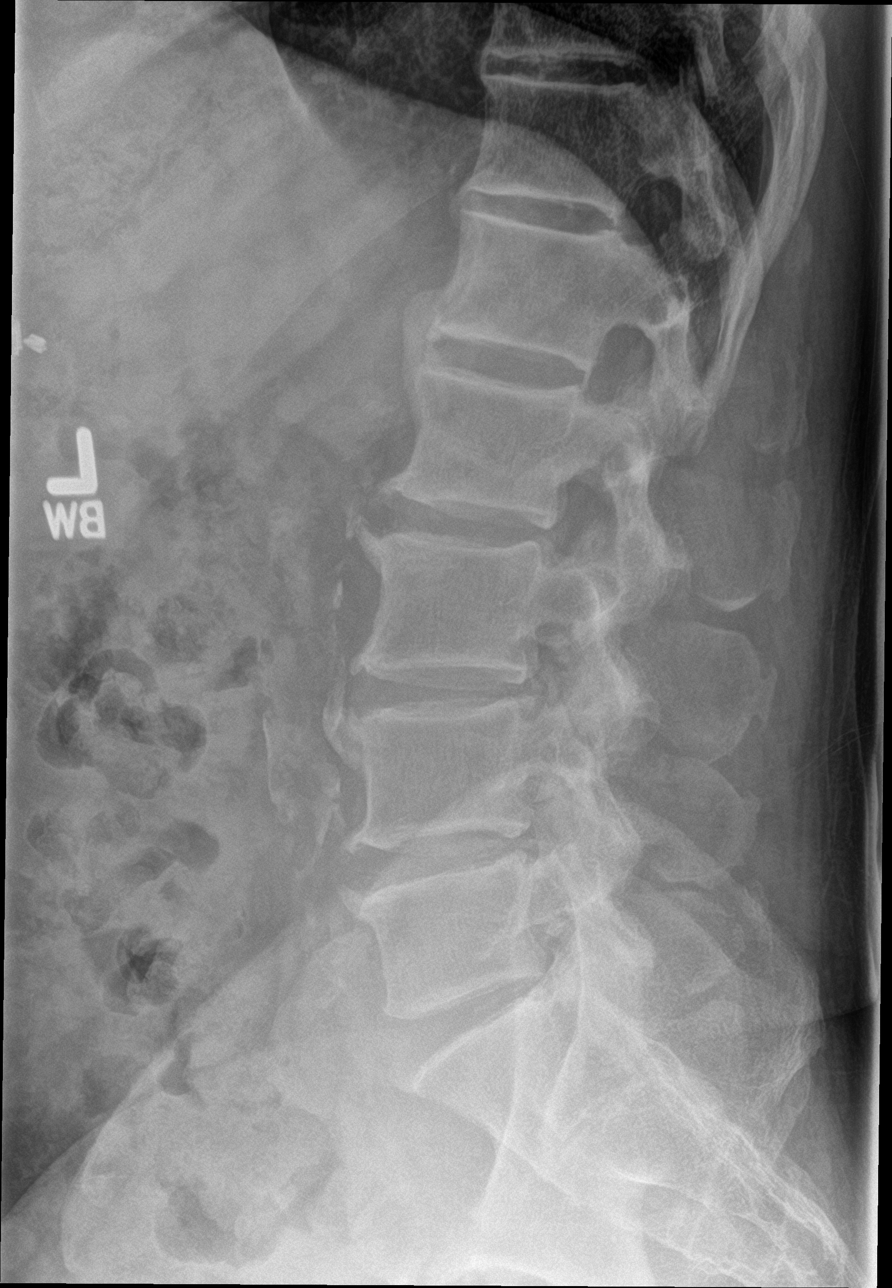

[l-spine spot]
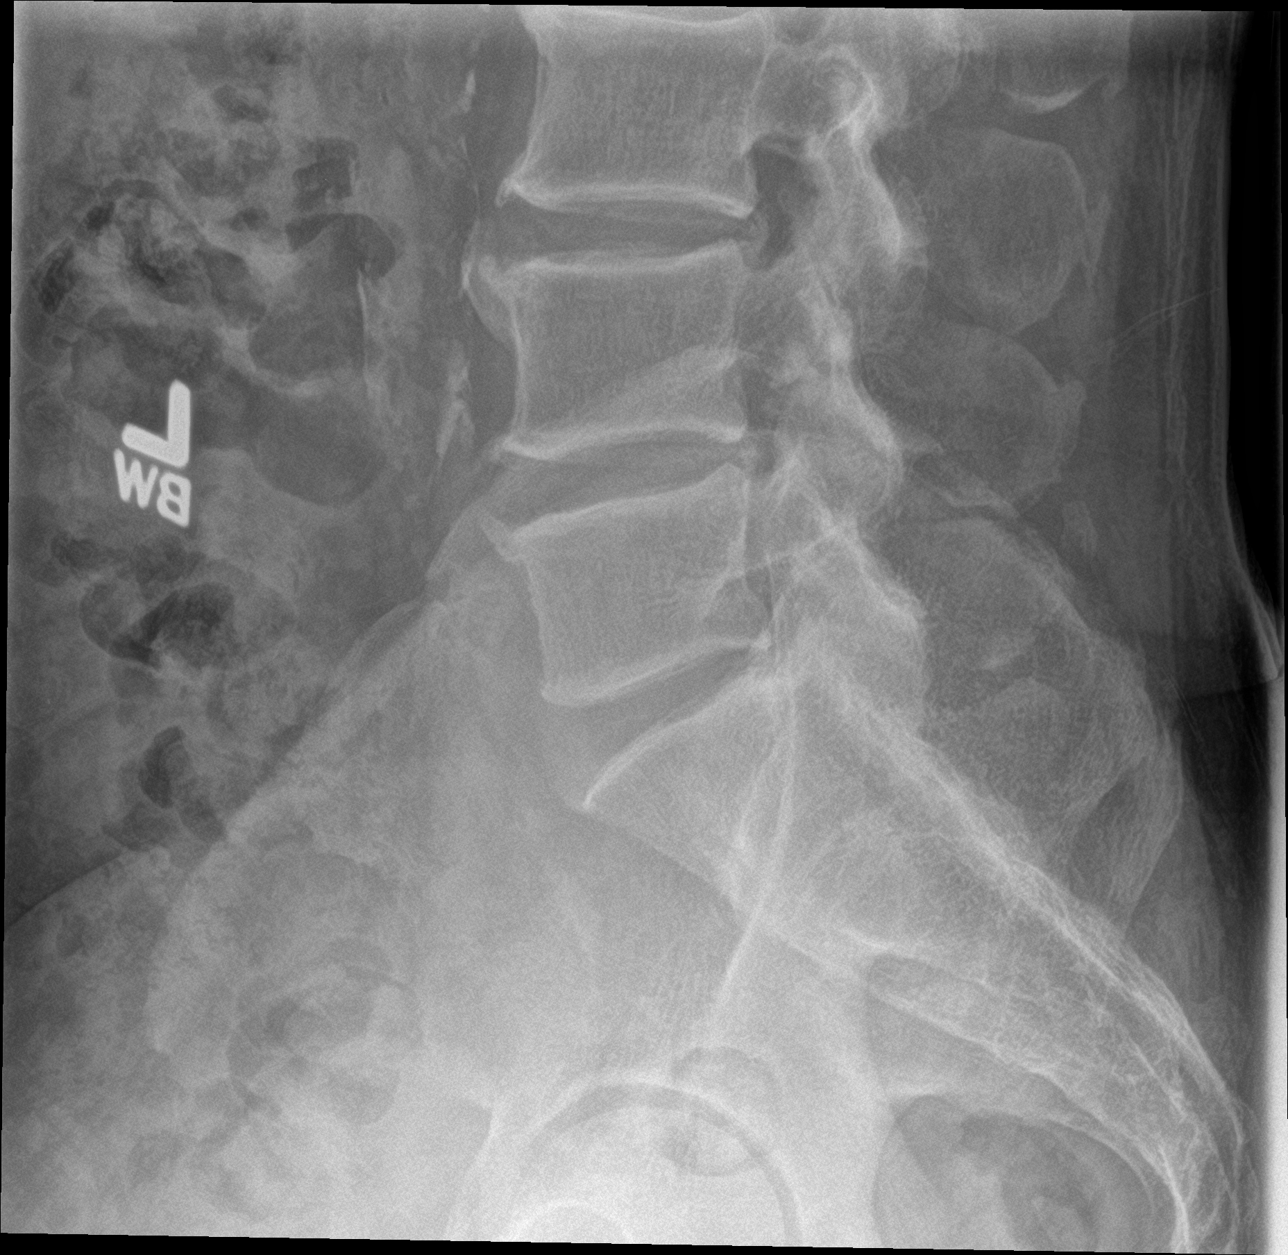

[5 of 5 positions shown; findings below may reference images not displayed]

FINDINGS: There is no evidence of lumbar spine fracture. Alignment is normal.
Intervertebral disc spaces are maintained. Aortic atherosclerosis
noted in the abdominal aorta.
IMPRESSION: No acute bony abnormality.

Aortic Atherosclerois ([id number])

## 2021-12-22 ENCOUNTER — Ambulatory Visit: Payer: No Typology Code available for payment source | Admitting: Urology

## 2021-12-23 ENCOUNTER — Ambulatory Visit: Payer: Medicare PPO | Admitting: Urology

## 2021-12-23 ENCOUNTER — Encounter: Payer: Self-pay | Admitting: Urology

## 2021-12-23 VITALS — BP 121/72 | HR 73 | Ht 71.0 in | Wt 184.0 lb

## 2021-12-23 DIAGNOSIS — N138 Other obstructive and reflux uropathy: Secondary | ICD-10-CM

## 2021-12-23 DIAGNOSIS — N401 Enlarged prostate with lower urinary tract symptoms: Secondary | ICD-10-CM

## 2021-12-23 LAB — BLADDER SCAN AMB NON-IMAGING

## 2021-12-23 NOTE — Progress Notes (Signed)
   12/23/2021 4:31 PM   Gerald Salas. June 24, 1951 [id number]  Reason for visit: BPH status post HOLEP  HPI: 71 year old male with multiple bladder stones and significant BPH symptoms of weak stream as well as recurrent gross hematuria who underwent an uncomplicated HOLEP on 0/90/3014 with removal of only benign tissue, and multiple bladder stones.  He has been doing well since that time and is urinating with an excellent stream.  PVR is normal at 100 mL.  He is not having any gross hematuria, incontinence, urgency/frequency, or weak stream  He prefers follow up as needed, return precautions discussed   Billey Co, [place] [address], [place], [place] [address] [phone]

## 2023-04-11 NOTE — Progress Notes (Signed)
 Ophthalmology Department Clinical Visit Note     CHIEF COMPLAINT Patient presents for Cataract   HISTORY OF PRESENT ILLNESS: Gerald Salas. is a 72 y.o. male who presents to the clinic today for a follow up cataract evaluation. The patient is s/p Super K OS 03/30/2020 and has a history of filamentary keratitis OU and EBMD OU.   Gerald Salas reports he reports he puts gel artificial tears in once every morning.  Of note, pt is a diabetic.  HPI     Cataract   The patient is here for a return visit.  The patient reports blurred vision, glare, haloes, starbursts and double vision.  In both eyes.  Activities of daily living affected include tv, driving and night driving.  The patient is bothered by vision and interested in pursuing surgical correction.  The patient does not request a new glasses prescription.  The patient does not need an eye medication refill.        Comments    72 y.o. male who presents to the clinic today for a follow up cataract evaluation. The patient is s/p Super K OS 03/30/2020 and has a history of filamentary keratitis OU and EBMD OU. Vision seems to be worse since last visit and interested in having surgery.      Last edited by Gerald Salas, COA on 04/12/2023  2:23 PM.      HISTORICAL INFORMATION:   CURRENT MEDICATIONS: Current Outpatient Medications (Ophthalmic Drugs)  Medication Sig  . polyvinyl alcohol (LIQUIFILM TEARS) 1.4 % ophthalmic solution Administer 1 drop into each eyes as needed.   No current facility-administered medications for this visit. (Ophthalmic Drugs)   Current Outpatient Medications (Other)  Medication Sig  . acetaminophen  (TYLENOL ) 500 mg tablet Take 1,000 mg by mouth every 4 (four) hours as needed for headaches or fever 100.4 F or GREATER (pain).  . albuterol  HFA (Ventolin  HFA) 90 mcg/actuation inhaler Inhale 2 puffs every 6 (six) hours as needed (wheezing).  . aspirin  325 mg tablet Take 325 mg by mouth Once Daily.  .  atorvastatin  (LIPITOR) 40 mg tablet Take 40 mg by mouth Once Daily.   No current facility-administered medications for this visit. (Other)    ALLERGIES Allergies  Allergen Reactions  . Oxycodone -Acetaminophen  Itching  . Codeine Itching and Rash    Veins pop out real bad    PAST MEDICAL HISTORY Past Medical History:  Diagnosis Date  . Arthritis    post-traumatic neck  . Bilateral dry eyes   . Carotid stenosis    bilateral,mild with estimated plaque proximal ICA < 50% in 02/2015  . COPD (chronic obstructive pulmonary disease) (CMD)    mild  . Filamentary keratitis of both eyes   . Graves disease   . Hypercholesteremia   . Hyperthyroidism   . Keratitis   . Sjogren's syndrome (CMD)   . Stenosis of lumbosacral spine   . TIA (transient ischemic attack) 02/2015   right side weakness without residual deficit   Past Surgical History:  Procedure Laterality Date  . BELPHAROPTOSIS REPAIR Bilateral 03/29/2017   Procedure: PTOSIS REPAIR EYE with Epperley skin reduction, CPT 67904, 50; 15823, 50; 67840 left upper lid;  Surgeon: Hadassah Marca Falconer, MD;  Location: [facility] OUTPATIENT OR;  Service: Ophthalmology;  Laterality: Bilateral;  . CARDIAC CATHETERIZATION  1990'   Procedure: CARDIAC CATHETERIZATION; no blockage per patient  . CARPAL TUNNEL RELEASE Bilateral    Procedure: CARPAL TUNNEL RELEASE  . CHOLECYSTECTOMY     Procedure: CHOLECYSTECTOMY  .  COLONOSCOPY     Procedure: COLONOSCOPY  . NECK SURGERY     Procedure: NECK SURGERY  . OTHER SURGICAL HISTORY     Procedure: OTHER SURGICAL HISTORY (history steel pieces removed eyes)  . OTHER SURGICAL HISTORY Bilateral 11/28/13   Procedure: OTHER SURGICAL HISTORY (Puntal Occlusion); Dr McClintic upper and lower  . SKIN TAG REMOVAL Left 03/29/2017   Procedure: SKIN TAG EXCISION;  Surgeon: Hadassah Marca Falconer, MD;  Location: [facility] OUTPATIENT OR;  Service: Ophthalmology;  Laterality: Left;  Will be done at the same  time of the upper lid ptosis repair  . TONSILLECTOMY     Procedure: TONSILLECTOMY    FAMILY HISTORY Family History  Problem Relation Name Age of Onset  . Cancer Mother    . Cancer Father    . Cataracts Father    . Cancer Sister    . Thyroid disease Sister    . Diabetes Brother    . Stroke Maternal Uncle    . Cataracts Paternal Uncle    . Glaucoma Neg Hx    . Macular degeneration Neg Hx    . Retinal detachment Neg Hx    . Strabismus Neg Hx    . Amblyopia Neg Hx    . Blindness Neg Hx    . Anesthesia problems Neg Hx      SOCIAL HISTORY Social History   Tobacco Use  . Smoking status: Every Day    Current packs/day: 0.75    Types: Cigarettes  . Smokeless tobacco: Never  Substance Use Topics  . Alcohol use: No         OPHTHALMIC EXAM:   Base Eye Exam     Visual Acuity (Snellen - Linear)       Right Left   Dist cc 20/30 20/40   Dist ph cc NI NI    Correction: Glasses         Tonometry (Tonopen: Tetracaine OU, 2:32 PM)       Right Left   Pressure 14 12         Pupils       APD   Right None   Left None         Neuro/Psych     Oriented x3: Yes   Mood/Affect: Normal         Dilation     Both eyes: 2.5% Phenylephrine , 1.0% Tropicamide @ 2:32 PM           Additional Tests     Glare Testing       High   Right 20/400   Left 20/400           Slit Lamp and Fundus Exam     External Exam       Right Left   External Normal Normal         Slit Lamp Exam       Right Left   Lids/Lashes Ptosis Ptosis   Conjunctiva/Sclera Nasal pterygium White and quiet   Cornea EBMD, PEE - improving, no filaments Trace peripherial EBMD, PEE - improving, no filaments   Anterior Chamber Deep and quiet Deep and quiet   Iris Round and reactive Round and reactive   Lens 2-3+ NS 3+ NS   Anterior Vitreous Normal Normal         Fundus Exam       Right Left   Disc Normal Normal   C/D Ratio 0.3 0.3   Macula Normal Normal  Refraction     Wearing Rx       Sphere Cylinder Axis Add   Right +0.25 +0.50 164 +2.50   Left +1.25 +0.50 168 +2.50    Type: PAL         Manifest Refraction       Sphere   Right tried NI   Left tried NI            IMAGING AND PROCEDURES:    ASSESSMENT/PLAN:  1. Filamentary keratitis of both eyes      2. Age-related nuclear cataract of both eyes      3. 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 Dry eyes, bilateral      5. History of superficial keratectomy      6. History of ptosis repair      7. Graves disease          Cataract OU - The patient complains of painless and progressive decreased vision in the left eye worse than the right since 1 year ago, exacerbated by glare from headlights at night, and interfering with activities of daily living. - The patient expresses a desire for improved vision and is interested in surgical correction if indicated.  - Discussed the option of multifocal lenses, explained that I would not advise having them placed vs monofocal lenses due to decreased overall visual potential. Pt wishes to proceed with monofocal lenses. - Discussed None vs traditional phaco as possible treatment with rba. Pt wishes to proceed with traditional phaco.  - Will schedule phaco OS topical, then phaco OD topical two weeks later  Filamentary keratitis OU - New filaments OU noted 01/12/22, resolved 01/21/22 - Off Acetylcysteine OU   EBMD OU - Dry eyes OU S/p Super K OS 03/30/2020 - Doing well, trace peripheral EBMD OS - AT's 3-4x/day prn    S/p ptosis repair LUL 03/29/2017 - With skin tag removal OS   Graves disease  - W/o evidence of TRIO, however, is causing dry eyes OU   S/p punctal occlusion OU 11/28/2013  - Per Dr. Carolene  DM2 w/o DR   RTC - Phaco OS topical Return for Scheduling surgery.   Patient Instructions  Dr. Marlyce cell phone number: [phone]   Ophthalmic Meds Ordered  this visit:  There were no meds ordered this visit.    Explained the diagnoses, plan, and follow up with the patient and they expressed understanding.  Patient expressed understanding of the importance of proper follow up care.    Abbreviations: M myopia (nearsighted); A astigmatism; H hyperopia (farsighted); P presbyopia; Mrx spectacle prescription;  CTL contact lenses; OD right eye; OS left eye; OU both eyes  XT exotropia; ET esotropia; PEK punctate epithelial keratitis; PEE punctate epithelial erosions; DES dry eye syndrome; MGD meibomian gland dysfunction; ATs artificial tears; PFAT's preservative free artificial tears; NSC nuclear sclerotic cataract; PSC posterior subcapsular cataract; ERM epi-retinal membrane; PVD posterior vitreous detachment; RD retinal detachment; DM diabetes mellitus; DR diabetic retinopathy; NPDR non-proliferative diabetic retinopathy; PDR proliferative diabetic retinopathy; CSME clinically significant macular edema; DME diabetic macular edema; dbh dot blot hemorrhages; CWS cotton wool spot; POAG primary open angle glaucoma; C/D cup-to-disc ratio; HVF humphrey visual field; GVF goldmann visual field; OCT optical coherence tomography; IOP intraocular pressure; BRVO Branch retinal vein occlusion; CRVO central retinal vein occlusion; CRAO central retinal artery occlusion; BRAO branch retinal artery occlusion; RT retinal tear; SB scleral buckle; PPV pars plana vitrectomy; VH Vitreous hemorrhage; PRP panretinal laser  photocoagulation; IVK intravitreal kenalog; VMT vitreomacular traction; MH Macular hole;  NVD neovascularization of the disc; NVE neovascularization elsewhere; AREDS age related eye disease study; ARMD age related macular degeneration; POAG primary open angle glaucoma; EBMD epithelial/anterior basement membrane dystrophy; ACIOL anterior chamber intraocular lens; IOL intraocular lens; PCIOL posterior chamber intraocular lens; Phaco/IOL phacoemulsification with intraocular  lens placement; PRK photorefractive keratectomy; LASIK laser assisted in situ keratomileusis; HTN hypertension; DM diabetes mellitus; COPD chronic obstructive pulmonary disease   This document serves as a record of services personally performed by Donnice Sickles, MD. It was created on their behalf by Jinnie Caldron, Scribe, a trained medical scribe. The creation of this record is the provider's dictation and/or activities during the visit.

## 2023-08-26 ENCOUNTER — Encounter (HOSPITAL_COMMUNITY): Payer: Self-pay | Admitting: *Deleted

## 2023-08-26 ENCOUNTER — Emergency Department (HOSPITAL_COMMUNITY)

## 2023-08-26 ENCOUNTER — Other Ambulatory Visit: Payer: Self-pay

## 2023-08-26 ENCOUNTER — Observation Stay (HOSPITAL_COMMUNITY)
Admission: EM | Admit: 2023-08-26 | Discharge: 2023-08-29 | Disposition: A | Discharge status: Home / Self Care | Attending: Family Medicine | Admitting: Family Medicine

## 2023-08-26 DIAGNOSIS — Z8673 Personal history of transient ischemic attack (TIA), and cerebral infarction without residual deficits: Secondary | ICD-10-CM | POA: Insufficient documentation

## 2023-08-26 DIAGNOSIS — F17210 Nicotine dependence, cigarettes, uncomplicated: Secondary | ICD-10-CM | POA: Insufficient documentation

## 2023-08-26 DIAGNOSIS — E119 Type 2 diabetes mellitus without complications: Secondary | ICD-10-CM

## 2023-08-26 DIAGNOSIS — N182 Chronic kidney disease, stage 2 (mild): Secondary | ICD-10-CM | POA: Insufficient documentation

## 2023-08-26 DIAGNOSIS — E1122 Type 2 diabetes mellitus with diabetic chronic kidney disease: Secondary | ICD-10-CM | POA: Insufficient documentation

## 2023-08-26 DIAGNOSIS — J449 Chronic obstructive pulmonary disease, unspecified: Secondary | ICD-10-CM | POA: Diagnosis not present

## 2023-08-26 DIAGNOSIS — K9423 Gastrostomy malfunction: Secondary | ICD-10-CM | POA: Diagnosis present

## 2023-08-26 DIAGNOSIS — N179 Acute kidney failure, unspecified: Secondary | ICD-10-CM | POA: Diagnosis not present

## 2023-08-26 DIAGNOSIS — E139 Other specified diabetes mellitus without complications: Secondary | ICD-10-CM | POA: Diagnosis not present

## 2023-08-26 DIAGNOSIS — Z8521 Personal history of malignant neoplasm of larynx: Secondary | ICD-10-CM | POA: Insufficient documentation

## 2023-08-26 DIAGNOSIS — J982 Interstitial emphysema: Principal | ICD-10-CM | POA: Diagnosis present

## 2023-08-26 DIAGNOSIS — I2510 Atherosclerotic heart disease of native coronary artery without angina pectoris: Secondary | ICD-10-CM | POA: Diagnosis not present

## 2023-08-26 DIAGNOSIS — Z85828 Personal history of other malignant neoplasm of skin: Secondary | ICD-10-CM | POA: Insufficient documentation

## 2023-08-26 DIAGNOSIS — Z79899 Other long term (current) drug therapy: Secondary | ICD-10-CM | POA: Insufficient documentation

## 2023-08-26 DIAGNOSIS — Z931 Gastrostomy status: Secondary | ICD-10-CM | POA: Diagnosis not present

## 2023-08-26 DIAGNOSIS — C329 Malignant neoplasm of larynx, unspecified: Secondary | ICD-10-CM | POA: Diagnosis not present

## 2023-08-26 DIAGNOSIS — E43 Unspecified severe protein-calorie malnutrition: Secondary | ICD-10-CM | POA: Insufficient documentation

## 2023-08-26 DIAGNOSIS — Z7982 Long term (current) use of aspirin: Secondary | ICD-10-CM | POA: Insufficient documentation

## 2023-08-26 LAB — CBC WITH DIFFERENTIAL/PLATELET
Abs Immature Granulocytes: 0.04 10*3/uL (ref 0.00–0.07)
Basophils Absolute: 0 10*3/uL (ref 0.0–0.1)
Basophils Relative: 0 %
Eosinophils Absolute: 0.2 10*3/uL (ref 0.0–0.5)
Eosinophils Relative: 1 %
HCT: 43.9 % (ref 39.0–52.0)
Hemoglobin: 14.8 g/dL (ref 13.0–17.0)
Immature Granulocytes: 0 %
Lymphocytes Relative: 13 %
Lymphs Abs: 1.4 10*3/uL (ref 0.7–4.0)
MCH: 32.3 pg (ref 26.0–34.0)
MCHC: 33.7 g/dL (ref 30.0–36.0)
MCV: 95.9 fL (ref 80.0–100.0)
Monocytes Absolute: 0.7 10*3/uL (ref 0.1–1.0)
Monocytes Relative: 7 %
Neutro Abs: 8.4 10*3/uL — ABNORMAL HIGH (ref 1.7–7.7)
Neutrophils Relative %: 79 %
Platelets: 268 10*3/uL (ref 150–400)
RBC: 4.58 MIL/uL (ref 4.22–5.81)
RDW: 11.4 % — ABNORMAL LOW (ref 11.5–15.5)
WBC: 10.7 10*3/uL — ABNORMAL HIGH (ref 4.0–10.5)
nRBC: 0 % (ref 0.0–0.2)

## 2023-08-26 LAB — COMPREHENSIVE METABOLIC PANEL
ALT: 82 U/L — ABNORMAL HIGH (ref 0–44)
AST: 41 U/L (ref 15–41)
Albumin: 3.7 g/dL (ref 3.5–5.0)
Alkaline Phosphatase: 24 U/L — ABNORMAL LOW (ref 38–126)
Anion gap: 11 (ref 5–15)
BUN: 42 mg/dL — ABNORMAL HIGH (ref 8–23)
CO2: 27 mmol/L (ref 22–32)
Calcium: 9.9 mg/dL (ref 8.9–10.3)
Chloride: 100 mmol/L (ref 98–111)
Creatinine, Ser: 1.5 mg/dL — ABNORMAL HIGH (ref 0.61–1.24)
GFR, Estimated: 49 mL/min — ABNORMAL LOW (ref >60)
Glucose, Bld: 120 mg/dL — ABNORMAL HIGH (ref 70–99)
Potassium: 4.5 mmol/L (ref 3.5–5.1)
Sodium: 138 mmol/L (ref 135–145)
Total Bilirubin: 0.7 mg/dL (ref 0.0–1.2)
Total Protein: 8.2 g/dL — ABNORMAL HIGH (ref 6.5–8.1)

## 2023-08-26 LAB — MAGNESIUM: Magnesium: 2.2 mg/dL (ref 1.7–2.4)

## 2023-08-26 LAB — LIPASE, BLOOD: Lipase: 60 U/L — ABNORMAL HIGH (ref 11–51)

## 2023-08-26 MED ORDER — PANTOPRAZOLE SODIUM 40 MG IV SOLR
40.0000 mg | Freq: Once | INTRAVENOUS | Status: AC
  Administered 2023-08-26: 40 mg via INTRAVENOUS
  Filled 2023-08-26: qty 10

## 2023-08-26 MED ORDER — INSULIN ASPART 100 UNIT/ML IJ SOLN: 0.0000 [IU] | Freq: Three times a day (TID) | INTRAMUSCULAR | Status: DC

## 2023-08-26 MED ORDER — STERILE WATER FOR INJECTION IJ SOLN
INTRAMUSCULAR | Status: AC
  Administered 2023-08-26: 10 mL
  Filled 2023-08-26: qty 10

## 2023-08-26 MED ORDER — METHIMAZOLE 10 MG PO TABS
10.0000 mg | ORAL_TABLET | Freq: Every day | ORAL | Status: DC
  Administered 2023-08-27 – 2023-08-28 (×2): 10 mg
  Filled 2023-08-26 (×3): qty 1

## 2023-08-26 MED ORDER — ONDANSETRON HCL 4 MG/2ML IJ SOLN: 4.0000 mg | Freq: Four times a day (QID) | INTRAMUSCULAR | Status: DC | PRN

## 2023-08-26 MED ORDER — LACTATED RINGERS IV BOLUS
1000.0000 mL | Freq: Once | INTRAVENOUS | Status: AC
  Administered 2023-08-26: 1000 mL via INTRAVENOUS

## 2023-08-26 MED ORDER — IOHEXOL 300 MG/ML  SOLN
100.0000 mL | Freq: Once | INTRAMUSCULAR | Status: AC | PRN
  Administered 2023-08-26: 100 mL via INTRAVENOUS

## 2023-08-26 MED ORDER — ONDANSETRON HCL 4 MG PO TABS: 4.0000 mg | ORAL_TABLET | Freq: Four times a day (QID) | ORAL | Status: DC | PRN

## 2023-08-26 MED ORDER — POLYETHYLENE GLYCOL 3350 17 G PO PACK: 17.0000 g | PACK | Freq: Every day | ORAL | Status: DC | PRN

## 2023-08-26 MED ORDER — PANTOPRAZOLE SODIUM 40 MG IV SOLR
40.0000 mg | INTRAVENOUS | Status: DC
  Administered 2023-08-27 – 2023-08-28 (×2): 40 mg via INTRAVENOUS
  Filled 2023-08-26 (×2): qty 10

## 2023-08-26 MED ORDER — PIPERACILLIN-TAZOBACTAM 3.375 G IVPB 30 MIN
3.3750 g | Freq: Once | INTRAVENOUS | Status: AC
  Administered 2023-08-26: 3.375 g via INTRAVENOUS
  Filled 2023-08-26: qty 50

## 2023-08-26 MED ORDER — SODIUM CHLORIDE 0.9 % IV SOLN: INTRAVENOUS | Status: AC

## 2023-08-26 MED ORDER — HYDROMORPHONE HCL 1 MG/ML IJ SOLN: 0.5000 mg | INTRAMUSCULAR | Status: DC | PRN

## 2023-08-26 NOTE — ED Triage Notes (Signed)
 Pt with feeding tube and pt concerned that is loose.  Emesis x 1, yellow and burned per pt.

## 2023-08-26 NOTE — H&P (Signed)
 History and Physical    Gerald Salas. NGE:[id number] DOB: [birth year] DOA: 08/26/2023  PCP: [facility]   Patient coming from: Home  I have personally briefly reviewed patient's old medical records in [facility] Link  Chief Complaint: gastric tube problems  HPI: Gerald Salas. is a 73 y.o. male with medical history significant for COPD, diabetes mellitus, head and neck cancer. Patient presented to the ED with reports of problems involving his gastric tube.  Tube was placed at the [facility] about a week ago, as he has not been able to eat or drink status post radiation therapy for head and neck cancer.  He takes water only through his mouth, nutrition and meds through his G-tube Patient reported yesterday evening he vomited, and today after feeding him through his tube, his spouse flushed his tube, and he vomited again.  Spouse also noted some drainage around his G-tube.   He otherwise denies chest or abdominal pain, no difficulty breathing.  No fevers no chills.  ED Course: Stable vitals. CT abdomen and pelvis with contrast-appropriately positioned G-tube, small amount of pneumoperitoneum. CT chest without contrast-pneumomediastinum within the anterior mediastinum. EDP talked to cardiac thoracic surgery, recommended esophagram.  Unfortunately this cannot be done here at [facility] till Monday hence admission to [facility].  Review of Systems: As per HPI all other systems reviewed and negative.  Past Medical History:  Diagnosis Date   Adenomatous colon polyp 10/2007   Angina    Arthritis    COPD (chronic obstructive pulmonary disease) (HCC)    Gallstones    GERD (gastroesophageal reflux disease)    Headache(784.0)    High cholesterol    Hyperthyroidism     Past Surgical History:  Procedure Laterality Date   ANTERIOR CERVICAL DECOMP/DISCECTOMY FUSION  1994   S/P neck fracture   BLADDER NECK RECONSTRUCTION     CARDIAC CATHETERIZATION     CARPAL TUNNEL  RELEASE  1993   left   CHOLECYSTECTOMY  09/28/2011   Procedure: LAPAROSCOPIC CHOLECYSTECTOMY WITH INTRAOPERATIVE CHOLANGIOGRAM;  Surgeon: Kandis Cocking, MD;  Location: [facility] OR;  Service: General;  Laterality: N/A;   CYSTOSCOPY WITH LITHOLAPAXY N/A 01/15/2021   Procedure: CYSTOSCOPY WITH LITHOLAPAXY;  Surgeon: Sondra Come, MD;  Location: [facility] ORS;  Service: Urology;  Laterality: N/A;   ESOPHAGOGASTRODUODENOSCOPY  03/02/2011   Procedure: ESOPHAGOGASTRODUODENOSCOPY (EGD);  Surgeon: Dalia Heading;  Location: [facility] ENDO SUITE;  Service: Gastroenterology;  Laterality: N/A;   HAND SURGERY Left 1980's   "got it crushed"   HERNIA REPAIR Bilateral    INGUINAL   HOLEP-LASER ENUCLEATION OF THE PROSTATE WITH MORCELLATION N/A 01/15/2021   Procedure: HOLEP-LASER ENUCLEATION OF THE PROSTATE WITH MORCELLATION;  Surgeon: Sondra Come, MD;  Location: [facility] ORS;  Service: Urology;  Laterality: N/A;   TONSILLECTOMY AND ADENOIDECTOMY  1960's     reports that he has been smoking cigarettes. He has a 34 pack-year smoking history. He has been exposed to tobacco smoke. He quit smokeless tobacco use about 53 years ago.  His smokeless tobacco use included chew. He reports that he does not drink alcohol and does not use drugs.  Allergies  Allergen Reactions   Codeine Itching, Rash and Other (See Comments)    Veins pop out real bad   Tylox [Oxycodone-Acetaminophen] Itching   Oxycodone-Acetaminophen Itching   Tyloxapol Rash    Family History  Problem Relation Age of Onset   Cancer Mother    Malignant hyperthermia Mother  Cancer Father        brain tumor/?prostate   Malignant hyperthermia Father    Cancer Sister    Malignant hyperthermia Sister    Cancer Son        breast   Cancer Paternal Aunt        breast     Prior to Admission medications   Medication Sig Start Date End Date Taking? Authorizing Provider  albuterol (VENTOLIN HFA) 108 (90 Base) MCG/ACT inhaler Inhale 1-2 puffs into the lungs  every 6 (six) hours as needed for wheezing or shortness of breath. 03/20/17  Yes [provider]  aspirin EC 81 MG tablet Take 81 mg by mouth daily. Swallow whole.   Yes [provider]  atorvastatin (LIPITOR) 80 MG tablet Take 1 tablet by mouth at bedtime. 07/25/23  Yes [provider]  bisacodyl (DULCOLAX) 5 MG EC tablet Take 10 mg by mouth daily. 08/21/23  Yes [provider]  buprenorphine (BUTRANS) 10 MCG/HR PTWK Place 1 patch onto the skin every Thursday. 08/21/23  Yes [provider]  doxepin (SINEQUAN) 10 MG/ML solution Take 25 mg by mouth in the morning and at bedtime. Rinse/Spit twice daily as needed for throat pain 08/21/23  Yes [provider]  empagliflozin (JARDIANCE) 25 MG TABS tablet Take 12.5 mg by mouth. 07/25/23  Yes [provider]  methimazole (TAPAZOLE) 10 MG tablet Take 10 mg by mouth daily. 01/30/23  Yes [provider]  oxyCODONE (ROXICODONE) 5 MG/5ML solution Take 5 mg by mouth every 4 (four) hours as needed for moderate pain (pain score 4-6) or severe pain (pain score 7-10). 08/21/23  Yes [provider]  polyethylene glycol (MIRALAX) 17 g packet 17 g daily as needed for moderate constipation or mild constipation. 08/21/23  Yes [provider]  polyvinyl alcohol (LIQUIFILM TEARS) 1.4 % ophthalmic solution Place 1 drop into both eyes as needed for dry eyes. 11/28/12  Yes [provider]  silver sulfADIAZINE (SILVADENE) 1 % cream Apply 1 Application topically See admin instructions. Apply a moderate amount topically as directed to moist desquamation of anterior neck daily 08/09/23  Yes [provider]    Physical Exam: Vitals:   08/26/23 1900 08/26/23 1930 08/26/23 2000 08/26/23 2030  BP: 106/75 119/72 104/70 (!) 104/48  Pulse: 77 84 71 70  Resp: 18 18 18 18   Temp:  98.6 F (37 C)    TempSrc:      SpO2: 93% 94% 93% 92%  Weight:      Height:        Constitutional: NAD,  calm, comfortable Vitals:   08/26/23 1900 08/26/23 1930 08/26/23 2000 08/26/23 2030  BP: 106/75 119/72 104/70 (!) 104/48  Pulse: 77 84 71 70  Resp: 18 18 18 18   Temp:  98.6 F (37 C)    TempSrc:      SpO2: 93% 94% 93% 92%  Weight:      Height:       Eyes: PERRL, lids and conjunctivae normal ENMT: Mucous membranes are moist.   Neck: Voice hoarse, significant localized crusting to anterior lower neck, per patient from radiation therapy.   Respiratory: clear to auscultation bilaterally, no wheezing, no crackles. Normal respiratory effort. No accessory muscle use.  Cardiovascular: Regular rate and rhythm, no murmurs / rubs / gallops. No extremity edema.  Extremities warm.  Abdomen: no tenderness, no masses palpated. No hepatosplenomegaly. Bowel sounds positive.  Musculoskeletal: no clubbing / cyanosis. No joint deformity upper and lower extremities.  Skin: no rashes, lesions, ulcers. No induration Neurologic: No facial asymmetry, moving extremities spontaneously,.  Psychiatric: Normal judgment and insight. Alert and oriented x 3. Normal mood.     Labs on Admission: I have personally reviewed following labs and imaging studies  CBC: Recent Labs  Lab 08/26/23 1618  WBC 10.7*  NEUTROABS 8.4*  HGB 14.8  HCT 43.9  MCV 95.9  PLT 268   Basic Metabolic Panel: Recent Labs  Lab 08/26/23 1618  NA 138  K 4.5  CL 100  CO2 27  GLUCOSE 120*  BUN 42*  CREATININE 1.50*  CALCIUM 9.9  MG 2.2   GFR: Estimated Creatinine Clearance: 47.4 mL/min (A) (by C-G formula based on SCr of 1.5 mg/dL (H)). Liver Function Tests: Recent Labs  Lab 08/26/23 1618  AST 41  ALT 82*  ALKPHOS 24*  BILITOT 0.7  PROT 8.2*  ALBUMIN 3.7   Recent Labs  Lab 08/26/23 1618  LIPASE 60*   Radiological Exams on Admission: CT Chest Wo Contrast Result Date: 08/26/2023 CLINICAL DATA:  Pneumomediastinum seen on CT abdomen. EXAM: CT CHEST WITHOUT CONTRAST TECHNIQUE: Multidetector CT imaging of the chest  was performed following the standard protocol without IV contrast. RADIATION DOSE REDUCTION: This exam was performed according to the departmental dose-optimization program which includes automated exposure control, adjustment of the mA and/or kV according to patient size and/or use of iterative reconstruction technique. COMPARISON:  CT abdomen and pelvis earlier today FINDINGS: Cardiovascular: Normal heart size. No pericardial effusion. Coronary artery and aortic atherosclerotic calcification. Mediastinum/Nodes: Unchanged pneumomediastinum within the anterior mediastinum similar to same day CT abdomen and pelvis likely related to pneumomediastinum. No additional air or fluid within the mediastinum. Trachea and esophagus are unremarkable. No thoracic adenopathy. Lungs/Pleura: No focal consolidation, pleural effusion, or pneumothorax. Similar focal area of fat attenuation density along the left posterior pleural surface extending into the major fissure (series 2/image 49 several small pulmonary nodules measuring up to 4 mm (in the left lower lobe series 2/image 99). No follow-up is recommended. Vague 8 mm ground-glass nodule in the right upper lobe (series 2/image 67) is unchanged from 12/04/2015 compatible with benign etiology. No follow-up recommended. The airways are patent. Upper Abdomen: Percutaneous gastrostomy tube. Similar pneumoperitoneum in the anterior abdomen Musculoskeletal: No acute fracture. IMPRESSION: 1. Unchanged pneumomediastinum within the anterior mediastinum similar to same day CT abdomen and pelvis likely related to pneumoperitoneum from gastrostomy tube placement. 2. Similar pneumoperitoneum in the anterior abdomen. 3. Aortic Atherosclerosis (ICD10-I70.0). Electronically Signed   By: Minerva Fester M.D.   On: 08/26/2023 20:01   CT ABDOMEN PELVIS W CONTRAST Result Date: 08/26/2023 CLINICAL DATA:  Postop abdominal pain. Vomiting. Concern for feeding tube displacement. EXAM: CT ABDOMEN AND  PELVIS WITH CONTRAST TECHNIQUE: Multidetector CT imaging of the abdomen and pelvis was performed using the standard protocol following bolus administration of intravenous contrast. RADIATION DOSE REDUCTION: This exam was performed according to the departmental dose-optimization program which includes automated exposure control, adjustment of the mA and/or kV according to patient size and/or use of iterative reconstruction technique. CONTRAST:  OMNIPAQUE IOHEXOL 300 MG/ML  SOLN COMPARISON:  None recent. Abdominopelvic CT 09/28/2020 and 06/13/2019. FINDINGS: Lower chest: Clear lung bases. No significant pleural or pericardial effusion. There is no basilar pneumothorax. However, anterior pneumomediastinum noted. There is mild aortic atherosclerosis. Hepatobiliary: The liver is normal in density without suspicious focal abnormality. Stable 9 mm cystic lesion in the left hepatic lobe on image 14/2. No significant biliary dilatation status post cholecystectomy.  Pancreas: Unremarkable. No pancreatic ductal dilatation or surrounding inflammatory changes. Spleen: Normal in size without focal abnormality. Adrenals/Urinary Tract: Stable right adrenal adenoma measuring 1.5 cm on image 21/2. Although this has a nonspecific density on this contrast enhanced study, it measured 4 HU on previous CT consistent with a lipid rich adenoma. No follow-up recommended. The left adrenal gland appears normal. No evidence of urinary tract calculus, suspicious renal lesion or hydronephrosis. There are multiple small renal cystic lesions bilaterally for which no specific follow-up imaging is recommended. The bladder appears unremarkable for its degree of distention. Stomach/Bowel: No enteric contrast administered. There is a percutaneous gastrostomy tube with the retention bulb in the gastric antrum. The stomach is decompressed. There is a small amount of extraluminal air anteriorly in the upper abdomen between the anterior abdominal wall  and the liver. There is no focal fluid collection. No bowel distension, wall thickening or surrounding inflammation. The appendix appears normal. Vascular/Lymphatic: There are no enlarged abdominal or pelvic lymph nodes. Aortic and branch vessel atherosclerosis without evidence of large vessel occlusion or aneurysm. The portal, superior mesenteric and splenic veins are patent. Reproductive: The prostate gland appears unremarkable. Other: Postsurgical changes in the anterior abdominal wall consistent with previous ventral hernia repair. No recurrent hernia identified. Stable prominent fat in both inguinal canals with extension of a small knuckle of small bowel into the right-sided hernia. No evidence of incarceration or obstruction. As above, small amount of pneumoperitoneum in the anterior upper abdomen which may relate to the patient's percutaneous G-tube, especially if recently placed. Musculoskeletal: No acute or significant osseous findings. Multilevel spondylosis. IMPRESSION: 1. The percutaneous gastrostomy tube appears appropriately positioned with the retention bulb in the gastric antrum. 2. Small amount of pneumoperitoneum in the anterior upper abdomen which may relate to the patient's percutaneous G-tube, especially if recently placed. Mild anterior pneumomediastinum may be related. Suggest chest radiographic correlation. 3. No evidence of bowel obstruction or other acute findings. 4. Stable right adrenal adenoma. 5. Stable bilateral inguinal hernias containing fat and a small knuckle of small bowel on the right. No evidence of incarceration or obstruction. 6.  Aortic Atherosclerosis (ICD10-I70.0). Electronically Signed   By: Carey Bullocks M.D.   On: 08/26/2023 17:59   EKG None.   Assessment/Plan Principal Problem:   Pneumomediastinum (HCC) Active Problems:   Laryngeal cancer (HCC)   G tube feedings (HCC)   DM (diabetes mellitus) (HCC)  Assessment and Plan: * Pneumomediastinum (HCC) As seen  on CT chest without contrast.  Presenting with problems with his G-tube, vomiting.  He denies chest pain or difficulty breathing.   - EDP talked to cardiothoracic surgery- Dr. Cliffton Asters, recommended getting esophagram.  Unfortunately this is not available at [facility] over the weekend, hence admission to [facility]. -N.p.o. - 1L bolus given, continue N/s 75cc/hr x 15hrs -IV Protonix 40 daily -Zosyn started for pneumomediastinum, hold further antibiotics for now.  G tube feedings (HCC) G-tube placed after  [facility]. patient unable to swallow due to radiation therapy for his laryngeal cancer.  Laryngeal cancer [facility]) Managed after [facility].  Diagnosed 04/2023, status post 29 days of radiation therapy.  Reports he completed radiation therapy 2 weeks ago.  Did not undergo surgery or chemotherapy.  Records not available in Care Everywhere. -IV Dilaudid 0.5 every 4 hourly  DM (diabetes mellitus) (HCC) - HgbA1c - SSI- S -Hold Jardiance   DVT prophylaxis:  SCDS pending oesophagram results Code Status: FULL Code Family Communication: None at bedside Disposition Plan: ~  2 days Consults called: Pending esophagram results may need to reconsult CTS surgery Admission status: Obs tele  I certify that at the point of admission it is my clinical judgment that the patient will require inpatient hospital care spanning beyond 2 midnights from the point of admission due to high intensity of service, high risk for further deterioration and high frequency of surveillance required.   Author: Onnie Boer, MD 08/26/2023 10:55 PM  For on call review [email or web address].

## 2023-08-26 NOTE — ED Notes (Signed)
 Pt care taken, resting, is going back to ct for chest ct

## 2023-08-26 NOTE — ED Provider Notes (Signed)
 [facility] AT [facility] Provider Note   CSN: [id number] Arrival date & time: 08/26/23  1440     History  No chief complaint on file.   Gerald Whitefield. is a 73 y.o. male.  HPI Patient presents for emesis.  Medical history includes COPD, GERD, HLD, arthritis, TIA, throat cancer.  Due to his cancer and radiation treatments, he was unable to tolerate p.o. intake.  1 week ago, he underwent IR placement of 18 [place] G-tube at [facility].  Although we will take fluids by mouth, food and medicine are typically provided through his G-tube.  Last night, he had an episode of emesis after tube feed.  He did undergo tube feeds overnight without complication.  This morning, his wife flushed his G-tube and he had a subsequent episode of emesis.  Patient and wife of also noticed a drainage around his G-tube insertion site.  Patient denies any physical complaints at this time.    Home Medications Prior to Admission medications   Medication Sig Start Date End Date Taking? Authorizing Provider  albuterol (VENTOLIN HFA) 108 (90 Base) MCG/ACT inhaler Inhale 1-2 puffs into the lungs every 6 (six) hours as needed for wheezing or shortness of breath. 03/20/17  Yes [provider]  aspirin EC 81 MG tablet Take 81 mg by mouth daily. Swallow whole.   Yes [provider]  atorvastatin (LIPITOR) 80 MG tablet Take 1 tablet by mouth at bedtime. 07/25/23  Yes [provider]  bisacodyl (DULCOLAX) 5 MG EC tablet Take 10 mg by mouth daily. 08/21/23  Yes [provider]  buprenorphine (BUTRANS) 10 MCG/HR PTWK Place 1 patch onto the skin every Thursday. 08/21/23  Yes [provider]  doxepin (SINEQUAN) 10 MG/ML solution Take 25 mg by mouth in the morning and at bedtime. Rinse/Spit twice daily as needed for throat pain 08/21/23  Yes [provider]  empagliflozin (JARDIANCE) 25 MG TABS tablet Take 12.5 mg by mouth. 07/25/23  Yes  [provider]  methimazole (TAPAZOLE) 10 MG tablet Take 10 mg by mouth daily. 01/30/23  Yes [provider]  oxyCODONE (ROXICODONE) 5 MG/5ML solution Take 5 mg by mouth every 4 (four) hours as needed for moderate pain (pain score 4-6) or severe pain (pain score 7-10). 08/21/23  Yes [provider]  polyethylene glycol (MIRALAX) 17 g packet 17 g daily as needed for moderate constipation or mild constipation. 08/21/23  Yes [provider]  polyvinyl alcohol (LIQUIFILM TEARS) 1.4 % ophthalmic solution Place 1 drop into both eyes as needed for dry eyes. 11/28/12  Yes [provider]  silver sulfADIAZINE (SILVADENE) 1 % cream Apply 1 Application topically See admin instructions. Apply a moderate amount topically as directed to moist desquamation of anterior neck daily 08/09/23  Yes [provider]      Allergies    Codeine, Tylox [oxycodone-acetaminophen], Oxycodone-acetaminophen, and Tyloxapol    Review of Systems   Review of Systems  Gastrointestinal:  Positive for vomiting.  Skin:  Positive for wound.  All other systems reviewed and are negative.   Physical Exam Updated Vital Signs BP (!) 104/48   Pulse 70   Temp 98.6 F (37 C)   Resp 18   Ht 5\' 11"  (1.803 m)   Wt 83.5 kg   SpO2 92%   BMI 25.67 kg/m  Physical Exam Vitals and nursing note reviewed.  Constitutional:      General: He is not in acute  distress.    Appearance: Normal appearance. He is well-developed. He is not ill-appearing, toxic-appearing or diaphoretic.  HENT:     Head: Normocephalic and atraumatic.     Right Ear: External ear normal.     Left Ear: External ear normal.     Nose: Nose normal.     Mouth/Throat:     Mouth: Mucous membranes are moist.  Eyes:     Extraocular Movements: Extraocular movements intact.     Conjunctiva/sclera: Conjunctivae normal.  Neck:     Comments: Old scarring to anterior neck. Cardiovascular:     Rate and Rhythm: Normal rate and  regular rhythm.  Pulmonary:     Effort: Pulmonary effort is normal. No respiratory distress.  Abdominal:     General: There is no distension.     Palpations: Abdomen is soft.     Tenderness: There is no abdominal tenderness.     Comments: G-tube in place on right side abdomen.  Gastric contents present in tube.  Small amount of drainage present at G-tube insertion site.  Musculoskeletal:        General: No swelling. Normal range of motion.  Skin:    General: Skin is warm and dry.     Coloration: Skin is not jaundiced or pale.  Neurological:     General: No focal deficit present.     Mental Status: He is alert and oriented to person, place, and time.  Psychiatric:        Mood and Affect: Mood normal.        Behavior: Behavior normal.     ED Results / Procedures / Treatments   Labs (all labs ordered are listed, but only abnormal results are displayed) Labs Reviewed  COMPREHENSIVE METABOLIC PANEL - Abnormal; Notable for the following components:      Result Value   Glucose, Bld 120 (*)    BUN 42 (*)    Creatinine, Ser 1.50 (*)    Total Protein 8.2 (*)    ALT 82 (*)    Alkaline Phosphatase 24 (*)    GFR, Estimated 49 (*)    All other components within normal limits  LIPASE, BLOOD - Abnormal; Notable for the following components:   Lipase 60 (*)    All other components within normal limits  CBC WITH DIFFERENTIAL/PLATELET - Abnormal; Notable for the following components:   WBC 10.7 (*)    RDW 11.4 (*)    Neutro Abs 8.4 (*)    All other components within normal limits  MAGNESIUM  HEMOGLOBIN A1C  I-STAT CHEM 8, ED    EKG None  Radiology CT Chest Wo Contrast Result Date: 08/26/2023 CLINICAL DATA:  Pneumomediastinum seen on CT abdomen. EXAM: CT CHEST WITHOUT CONTRAST TECHNIQUE: Multidetector CT imaging of the chest was performed following the standard protocol without IV contrast. RADIATION DOSE REDUCTION: This exam was performed according to the departmental  dose-optimization program which includes automated exposure control, adjustment of the mA and/or kV according to patient size and/or use of iterative reconstruction technique. COMPARISON:  CT abdomen and pelvis earlier today FINDINGS: Cardiovascular: Normal heart size. No pericardial effusion. Coronary artery and aortic atherosclerotic calcification. Mediastinum/Nodes: Unchanged pneumomediastinum within the anterior mediastinum similar to same day CT abdomen and pelvis likely related to pneumomediastinum. No additional air or fluid within the mediastinum. Trachea and esophagus are unremarkable. No thoracic adenopathy. Lungs/Pleura: No focal consolidation, pleural effusion, or pneumothorax. Similar focal area of fat attenuation density along the left posterior pleural surface extending into the  major fissure (series 2/image 49 several small pulmonary nodules measuring up to 4 mm (in the left lower lobe series 2/image 99). No follow-up is recommended. Vague 8 mm ground-glass nodule in the right upper lobe (series 2/image 67) is unchanged from 12/04/2015 compatible with benign etiology. No follow-up recommended. The airways are patent. Upper Abdomen: Percutaneous gastrostomy tube. Similar pneumoperitoneum in the anterior abdomen Musculoskeletal: No acute fracture. IMPRESSION: 1. Unchanged pneumomediastinum within the anterior mediastinum similar to same day CT abdomen and pelvis likely related to pneumoperitoneum from gastrostomy tube placement. 2. Similar pneumoperitoneum in the anterior abdomen. 3. Aortic Atherosclerosis (ICD10-I70.0). Electronically Signed   By: Minerva Fester M.D.   On: 08/26/2023 20:01   CT ABDOMEN PELVIS W CONTRAST Result Date: 08/26/2023 CLINICAL DATA:  Postop abdominal pain. Vomiting. Concern for feeding tube displacement. EXAM: CT ABDOMEN AND PELVIS WITH CONTRAST TECHNIQUE: Multidetector CT imaging of the abdomen and pelvis was performed using the standard protocol following bolus  administration of intravenous contrast. RADIATION DOSE REDUCTION: This exam was performed according to the departmental dose-optimization program which includes automated exposure control, adjustment of the mA and/or kV according to patient size and/or use of iterative reconstruction technique. CONTRAST:  OMNIPAQUE IOHEXOL 300 MG/ML  SOLN COMPARISON:  None recent. Abdominopelvic CT 09/28/2020 and 06/13/2019. FINDINGS: Lower chest: Clear lung bases. No significant pleural or pericardial effusion. There is no basilar pneumothorax. However, anterior pneumomediastinum noted. There is mild aortic atherosclerosis. Hepatobiliary: The liver is normal in density without suspicious focal abnormality. Stable 9 mm cystic lesion in the left hepatic lobe on image 14/2. No significant biliary dilatation status post cholecystectomy. Pancreas: Unremarkable. No pancreatic ductal dilatation or surrounding inflammatory changes. Spleen: Normal in size without focal abnormality. Adrenals/Urinary Tract: Stable right adrenal adenoma measuring 1.5 cm on image 21/2. Although this has a nonspecific density on this contrast enhanced study, it measured 4 HU on previous CT consistent with a lipid rich adenoma. No follow-up recommended. The left adrenal gland appears normal. No evidence of urinary tract calculus, suspicious renal lesion or hydronephrosis. There are multiple small renal cystic lesions bilaterally for which no specific follow-up imaging is recommended. The bladder appears unremarkable for its degree of distention. Stomach/Bowel: No enteric contrast administered. There is a percutaneous gastrostomy tube with the retention bulb in the gastric antrum. The stomach is decompressed. There is a small amount of extraluminal air anteriorly in the upper abdomen between the anterior abdominal wall and the liver. There is no focal fluid collection. No bowel distension, wall thickening or surrounding inflammation. The appendix appears  normal. Vascular/Lymphatic: There are no enlarged abdominal or pelvic lymph nodes. Aortic and branch vessel atherosclerosis without evidence of large vessel occlusion or aneurysm. The portal, superior mesenteric and splenic veins are patent. Reproductive: The prostate gland appears unremarkable. Other: Postsurgical changes in the anterior abdominal wall consistent with previous ventral hernia repair. No recurrent hernia identified. Stable prominent fat in both inguinal canals with extension of a small knuckle of small bowel into the right-sided hernia. No evidence of incarceration or obstruction. As above, small amount of pneumoperitoneum in the anterior upper abdomen which may relate to the patient's percutaneous G-tube, especially if recently placed. Musculoskeletal: No acute or significant osseous findings. Multilevel spondylosis. IMPRESSION: 1. The percutaneous gastrostomy tube appears appropriately positioned with the retention bulb in the gastric antrum. 2. Small amount of pneumoperitoneum in the anterior upper abdomen which may relate to the patient's percutaneous G-tube, especially if recently placed. Mild anterior pneumomediastinum may be related. Suggest chest  radiographic correlation. 3. No evidence of bowel obstruction or other acute findings. 4. Stable right adrenal adenoma. 5. Stable bilateral inguinal hernias containing fat and a small knuckle of small bowel on the right. No evidence of incarceration or obstruction. 6.  Aortic Atherosclerosis (ICD10-I70.0). Electronically Signed   By: Carey Bullocks M.D.   On: 08/26/2023 17:59    Procedures Procedures    Medications Ordered in ED Medications  HYDROmorphone (DILAUDID) injection 0.5 mg (has no administration in time range)  0.9 %  sodium chloride infusion ( Intravenous New Bag/Given 08/26/23 2322)  iohexol (OMNIPAQUE) 300 MG/ML solution 100 mL (100 mLs Intravenous Contrast Given 08/26/23 1658)  lactated ringers bolus 1,000 mL (1,000 mLs  Intravenous New Bag/Given 08/26/23 2149)  pantoprazole (PROTONIX) injection 40 mg (40 mg Intravenous Given 08/26/23 2148)  piperacillin-tazobactam (ZOSYN) IVPB 3.375 g (3.375 g Intravenous New Bag/Given 08/26/23 2149)  sterile water (preservative free) injection (10 mLs  Given 08/26/23 2149)    ED Course/ Medical Decision Making/ A&P                                 Medical Decision Making Amount and/or Complexity of Data Reviewed Labs: ordered. Radiology: ordered.  Risk Prescription drug management. Decision regarding hospitalization.   This patient presents to the ED for concern of emesis, this involves an extensive number of treatment options, and is a complaint that carries with it a high risk of complications and morbidity.  The differential diagnosis includes SBO, postoperative complication, gastritis, GERD   Co morbidities that complicate the patient evaluation  COPD, GERD, HLD, arthritis, TIA, throat cancer   Additional history obtained:  Additional history obtained from patient's wife External records from outside source obtained and reviewed including EMR   Lab Tests:  I Ordered, and personally interpreted labs.  The pertinent results include: Baseline creatinine, slight leukocytosis, slight elevation in lipase   Imaging Studies ordered:  I ordered imaging studies including CT of chest, abdomen, pelvis I independently visualized and interpreted imaging which showed small amount of pneumoperitoneum and pneumomediastinum I agree with the radiologist interpretation   Cardiac Monitoring: / EKG:  The patient was maintained on a cardiac monitor.  I personally viewed and interpreted the cardiac monitored which showed an underlying rhythm of: Sinus rhythm   Consultations Obtained:  I requested consultation with the cardiothoracic surgeon, Dr. Cliffton Asters,  and discussed lab and imaging findings as well as pertinent plan - they recommend: Obtain esophagogram   Problem  List / ED Course / Critical interventions / Medication management  Patient presenting for G-tube concerns.  Due to his history of head and neck cancer s/p radiation, he did have G-tube placed at [facility] a week ago.  He has been undergoing tube feedings since that time.  He had an episode of emesis last night and again this morning.  Patient and wife are concerned about drainage at G-tube insertion site.  On arrival in the ED, patient's vital signs are normal.  He is well-appearing on exam.  He has no abdominal pain or tenderness.  There is no erythema surrounding his G-tube insertion site.  There is a scant amount of drainage which is likely expected postoperative findings.  G-tube itself has yellow fluid present consistent with gastric contents.  Given patient's concerns, will obtain imaging to confirm proper G-tube placement.  On CT of abdomen and pelvis, G-tube is in proper position.  There was a small amount of  pneumoperitoneum and upper abdomen which is likely secondary to his recent procedure.  There was also mild pneumomediastinum identified.  CT of chest was ordered to further characterize.  CT of chest showed redemonstration of pneumomediastinum.  I discussed this with cardiothoracic surgeon on-call, Dr. Cliffton Asters, who recommends obtaining esophagram.  This was ordered but, unfortunately, the study is not available at [facility] on the weekend.  Patient is agreeable for admission to [facility] for observation and swallow study.  Patient was treated with dose of Zosyn.  IV fluids provided.  Patient was admitted for further management. I ordered medication including IV fluids for hydration; Protonix and Zosyn for possible perforated viscus Reevaluation of the patient after these medicines showed that the patient improved I have reviewed the patients home medicines and have made adjustments as needed   Social Determinants of Health:  Has access [facility] outpatient [facility] through [facility]        Final  Clinical Impression(s) / ED Diagnoses Final diagnoses:  Pneumomediastinum [facility])    Rx / DC Orders ED Discharge Orders     None         Gloris Manchester, MD 08/26/23 2336

## 2023-08-26 NOTE — Assessment & Plan Note (Addendum)
 Managed after [facility].  Diagnosed 04/2023, status post 29 days of radiation therapy.  Reports he completed radiation therapy 2 weeks ago.  Did not undergo surgery or chemotherapy.  Records not available in Care Everywhere. -IV Dilaudid 0.5 every 4 hourly

## 2023-08-26 NOTE — Assessment & Plan Note (Addendum)
 As seen on CT chest without contrast.  Presenting with problems with his G-tube, vomiting.  He denies chest pain or difficulty breathing.   - EDP talked to cardiothoracic surgery- Dr. Cliffton Asters, recommended getting esophagram.  Unfortunately this is not available at [facility] over the weekend, hence admission to [facility]. -N.p.o. - 1L bolus given, continue N/s 75cc/hr x 15hrs -IV Protonix 40 daily -Zosyn started for pneumomediastinum, hold further antibiotics for now.

## 2023-08-26 NOTE — Assessment & Plan Note (Signed)
 G-tube placed after  [facility]. patient unable to swallow due to radiation therapy for his laryngeal cancer.

## 2023-08-26 NOTE — Assessment & Plan Note (Signed)
-   HgbA1c - SSI- S -Hold Jardiance

## 2023-08-26 NOTE — ED Notes (Signed)
 Patient transported to CT

## 2023-08-27 ENCOUNTER — Observation Stay (HOSPITAL_COMMUNITY)

## 2023-08-27 DIAGNOSIS — J449 Chronic obstructive pulmonary disease, unspecified: Secondary | ICD-10-CM | POA: Diagnosis not present

## 2023-08-27 DIAGNOSIS — E1122 Type 2 diabetes mellitus with diabetic chronic kidney disease: Secondary | ICD-10-CM | POA: Diagnosis not present

## 2023-08-27 DIAGNOSIS — K9423 Gastrostomy malfunction: Secondary | ICD-10-CM | POA: Diagnosis present

## 2023-08-27 DIAGNOSIS — C329 Malignant neoplasm of larynx, unspecified: Secondary | ICD-10-CM | POA: Diagnosis not present

## 2023-08-27 DIAGNOSIS — N179 Acute kidney failure, unspecified: Secondary | ICD-10-CM | POA: Diagnosis not present

## 2023-08-27 DIAGNOSIS — Z8673 Personal history of transient ischemic attack (TIA), and cerebral infarction without residual deficits: Secondary | ICD-10-CM | POA: Diagnosis not present

## 2023-08-27 DIAGNOSIS — E139 Other specified diabetes mellitus without complications: Secondary | ICD-10-CM | POA: Diagnosis not present

## 2023-08-27 DIAGNOSIS — J982 Interstitial emphysema: Secondary | ICD-10-CM | POA: Diagnosis not present

## 2023-08-27 DIAGNOSIS — N182 Chronic kidney disease, stage 2 (mild): Secondary | ICD-10-CM | POA: Diagnosis not present

## 2023-08-27 DIAGNOSIS — Z8521 Personal history of malignant neoplasm of larynx: Secondary | ICD-10-CM | POA: Diagnosis not present

## 2023-08-27 DIAGNOSIS — F17210 Nicotine dependence, cigarettes, uncomplicated: Secondary | ICD-10-CM | POA: Diagnosis not present

## 2023-08-27 DIAGNOSIS — Z85828 Personal history of other malignant neoplasm of skin: Secondary | ICD-10-CM | POA: Diagnosis not present

## 2023-08-27 DIAGNOSIS — Z7982 Long term (current) use of aspirin: Secondary | ICD-10-CM | POA: Diagnosis not present

## 2023-08-27 DIAGNOSIS — Z931 Gastrostomy status: Secondary | ICD-10-CM | POA: Diagnosis not present

## 2023-08-27 DIAGNOSIS — I2510 Atherosclerotic heart disease of native coronary artery without angina pectoris: Secondary | ICD-10-CM | POA: Diagnosis not present

## 2023-08-27 DIAGNOSIS — Z79899 Other long term (current) drug therapy: Secondary | ICD-10-CM | POA: Diagnosis not present

## 2023-08-27 LAB — I-STAT CHEM 8, ED
BUN: 60 mg/dL — ABNORMAL HIGH (ref 8–23)
Calcium, Ion: 1 mmol/L — ABNORMAL LOW (ref 1.15–1.40)
Chloride: 104 mmol/L (ref 98–111)
Creatinine, Ser: 1.6 mg/dL — ABNORMAL HIGH (ref 0.61–1.24)
Glucose, Bld: 115 mg/dL — ABNORMAL HIGH (ref 70–99)
HCT: 45 % (ref 39.0–52.0)
Hemoglobin: 15.3 g/dL (ref 13.0–17.0)
Potassium: 5.7 mmol/L — ABNORMAL HIGH (ref 3.5–5.1)
Sodium: 136 mmol/L (ref 135–145)
TCO2: 28 mmol/L (ref 22–32)

## 2023-08-27 LAB — CBG MONITORING, ED
Glucose-Capillary: 91 mg/dL (ref 70–99)
Glucose-Capillary: 96 mg/dL (ref 70–99)

## 2023-08-27 LAB — HEMOGLOBIN A1C
Hgb A1c MFr Bld: 5.8 % — ABNORMAL HIGH (ref 4.8–5.6)
Mean Plasma Glucose: 119.76 mg/dL

## 2023-08-27 LAB — GLUCOSE, CAPILLARY
Glucose-Capillary: 107 mg/dL — ABNORMAL HIGH (ref 70–99)
Glucose-Capillary: 85 mg/dL (ref 70–99)

## 2023-08-27 MED ORDER — IOHEXOL 300 MG/ML  SOLN
100.0000 mL | Freq: Once | INTRAMUSCULAR | Status: AC
  Administered 2023-08-27: 100 mL via ORAL

## 2023-08-27 NOTE — Plan of Care (Signed)
  Problem: Education: Goal: Ability to describe self-care measures that may prevent or decrease complications (Diabetes Survival Skills Education) will improve Outcome: Progressing Goal: Individualized Educational Video(s) Outcome: Progressing   Problem: Coping: Goal: Ability to adjust to condition or change in health will improve Outcome: Progressing   Problem: Fluid Volume: Goal: Ability to maintain a balanced intake and output will improve Outcome: Progressing   Problem: Fluid Volume: Goal: Ability to maintain a balanced intake and output will improve Outcome: Progressing   Problem: Health Behavior/Discharge Planning: Goal: Ability to identify and utilize available resources and services will improve Outcome: Progressing Goal: Ability to manage health-related needs will improve Outcome: Progressing   Problem: Metabolic: Goal: Ability to maintain appropriate glucose levels will improve Outcome: Progressing   Problem: Nutritional: Goal: Maintenance of adequate nutrition will improve Outcome: Progressing Goal: Progress toward achieving an optimal weight will improve Outcome: Progressing   Problem: Health Behavior/Discharge Planning: Goal: Ability to manage health-related needs will improve Outcome: Progressing   Problem: Clinical Measurements: Goal: Ability to maintain clinical measurements within normal limits will improve Outcome: Progressing Goal: Will remain free from infection Outcome: Progressing Goal: Diagnostic test results will improve Outcome: Progressing Goal: Respiratory complications will improve Outcome: Progressing Goal: Cardiovascular complication will be avoided Outcome: Progressing   Problem: Activity: Goal: Risk for activity intolerance will decrease Outcome: Progressing

## 2023-08-27 NOTE — Plan of Care (Signed)
  Problem: Coping: Goal: Ability to adjust to condition or change in health will improve Outcome: Progressing   Problem: Education: Goal: Knowledge of General Education information will improve Description: Including pain rating scale, medication(s)/side effects and non-pharmacologic comfort measures Outcome: Progressing   Problem: Pain Managment: Goal: General experience of comfort will improve and/or be controlled Outcome: Progressing   Problem: Safety: Goal: Ability to remain free from injury will improve Outcome: Progressing

## 2023-08-27 NOTE — Progress Notes (Signed)
 Cleansed around the LLQ Abd PEG tube and redressed, cleaned off bottoms of feet.

## 2023-08-27 NOTE — Progress Notes (Signed)
   08/27/23 0841  TOC Brief Assessment  Insurance and Status Reviewed  Patient has primary care physician Yes  Home environment has been reviewed From home with spouse  Prior level of function: Need Assistance (Feeding Tube)  Prior/Current Home Services Current home services  Social Drivers of Health Review SDOH reviewed no interventions necessary  Readmission risk has been reviewed Yes  Transition of care needs transition of care needs identified, TOC will continue to follow   CSW completed [facility] notification.  TOC to follow should there be discharge needs.

## 2023-08-27 NOTE — ED Notes (Signed)
 ED TO INPATIENT HANDOFF REPORT  ED Nurse Name and Phone #: Beverley Fiedler Name/Age/Gender Gerald Salas. 73 y.o. male Room/Bed: APA02/APA02  Code Status   Code Status: Full Code  Home/SNF/Other Home Patient oriented to: self, place, time, and situation Is this baseline? Yes   Triage Complete: Triage complete  Chief Complaint Pneumomediastinum (HCC) [J98.2]  Triage Note Pt with feeding tube and pt concerned that is loose.  Emesis x 1, yellow and burned per pt.    Allergies Allergies  Allergen Reactions   Codeine Itching, Rash and Other (See Comments)    Veins pop out real bad   Tylox [Oxycodone-Acetaminophen] Itching   Oxycodone-Acetaminophen Itching   Tyloxapol Rash    Level of Care/Admitting Diagnosis ED Disposition     ED Disposition  Admit   Condition  --   Comment  Hospital Area: [facility] [[id number]]  Level of Care: Telemetry Medical [104]  May place patient in observation at [facility] or [facility] if equivalent level of care is available:: No  Covid Evaluation: Asymptomatic - no recent exposure (last 10 days) testing not required  Diagnosis: Pneumomediastinum [facility]) [[id number]]  Admitting Physician: Onnie Boer [[id number]]  Attending Physician: Onnie Boer Xenia.Ehsan Corvin          B Medical/Surgery History Past Medical History:  Diagnosis Date   Adenomatous colon polyp 10/2007   Angina    Arthritis    COPD (chronic obstructive pulmonary disease) (HCC)    Gallstones    GERD (gastroesophageal reflux disease)    Headache(784.0)    High cholesterol    Hyperthyroidism    Past Surgical History:  Procedure Laterality Date   ANTERIOR CERVICAL DECOMP/DISCECTOMY FUSION  1994   S/P neck fracture   BLADDER NECK RECONSTRUCTION     CARDIAC CATHETERIZATION     CARPAL TUNNEL RELEASE  1993   left   CHOLECYSTECTOMY  09/28/2011   Procedure: LAPAROSCOPIC CHOLECYSTECTOMY WITH INTRAOPERATIVE CHOLANGIOGRAM;  Surgeon: Kandis Cocking, MD;  Location: [facility] OR;  Service: General;  Laterality: N/A;   CYSTOSCOPY WITH LITHOLAPAXY N/A 01/15/2021   Procedure: CYSTOSCOPY WITH LITHOLAPAXY;  Surgeon: Sondra Come, MD;  Location: [facility] ORS;  Service: Urology;  Laterality: N/A;   ESOPHAGOGASTRODUODENOSCOPY  03/02/2011   Procedure: ESOPHAGOGASTRODUODENOSCOPY (EGD);  Surgeon: Dalia Heading;  Location: [facility] ENDO SUITE;  Service: Gastroenterology;  Laterality: N/A;   HAND SURGERY Left 1980's   "got it crushed"   HERNIA REPAIR Bilateral    INGUINAL   HOLEP-LASER ENUCLEATION OF THE PROSTATE WITH MORCELLATION N/A 01/15/2021   Procedure: HOLEP-LASER ENUCLEATION OF THE PROSTATE WITH MORCELLATION;  Surgeon: Sondra Come, MD;  Location: [facility] ORS;  Service: Urology;  Laterality: N/A;   TONSILLECTOMY AND ADENOIDECTOMY  1960's     A IV Location/Drains/Wounds Patient Lines/Drains/Airways Status     Active Line/Drains/Airways     Name Placement date Placement time Site Days   Peripheral IV 08/26/23 20 G 1" Distal;Posterior;Right Forearm 08/26/23  1645  Forearm  1   Incision (Closed) 01/15/21 Penis 01/15/21  0834  -- 954   Incision - 4 Ports Abdomen 09/28/11  --  -- 4351            Intake/Output Last 24 hours No intake or output data in the 24 hours ending 08/27/23 0930  Labs/Imaging Results for orders placed or performed during the hospital encounter of 08/26/23 (from the past 48 hours)  Comprehensive metabolic panel     Status: Abnormal  Collection Time: 08/26/23  4:18 PM  Result Value Ref Range   Sodium 138 135 - 145 mmol/L   Potassium 4.5 3.5 - 5.1 mmol/L   Chloride 100 98 - 111 mmol/L   CO2 27 22 - 32 mmol/L   Glucose, Bld 120 (H) 70 - 99 mg/dL    Comment: Glucose reference range applies only to samples taken after fasting for at least 8 hours.   BUN 42 (H) 8 - 23 mg/dL   Creatinine, Ser 4.09 (H) 0.61 - 1.24 mg/dL   Calcium 9.9 8.9 - 81.1 mg/dL   Total Protein 8.2 (H) 6.5 - 8.1 g/dL   Albumin 3.7 3.5 - 5.0 g/dL    AST 41 15 - 41 U/L   ALT 82 (H) 0 - 44 U/L   Alkaline Phosphatase 24 (L) 38 - 126 U/L   Total Bilirubin 0.7 0.0 - 1.2 mg/dL   GFR, Estimated 49 (L) >60 mL/min    Comment: (NOTE) Calculated using the CKD-EPI Creatinine Equation (2021)    Anion gap 11 5 - 15    Comment: Performed at [facility], [address], [place], [place] [address]  Lipase, blood     Status: Abnormal   Collection Time: 08/26/23  4:18 PM  Result Value Ref Range   Lipase 60 (H) 11 - 51 U/L    Comment: Performed at [facility], [address], [place], [place] [address]  CBC with Diff     Status: Abnormal   Collection Time: 08/26/23  4:18 PM  Result Value Ref Range   WBC 10.7 (H) 4.0 - 10.5 K/uL   RBC 4.58 4.22 - 5.81 MIL/uL   Hemoglobin 14.8 13.0 - 17.0 g/dL   HCT 13.0 86.5 - 78.4 %   MCV 95.9 80.0 - 100.0 fL   MCH 32.3 26.0 - 34.0 pg   MCHC 33.7 30.0 - 36.0 g/dL   RDW 69.6 (L) 29.5 - 28.4 %   Platelets 268 150 - 400 K/uL   nRBC 0.0 0.0 - 0.2 %   Neutrophils Relative % 79 %   Neutro Abs 8.4 (H) 1.7 - 7.7 K/uL   Lymphocytes Relative 13 %   Lymphs Abs 1.4 0.7 - 4.0 K/uL   Monocytes Relative 7 %   Monocytes Absolute 0.7 0.1 - 1.0 K/uL   Eosinophils Relative 1 %   Eosinophils Absolute 0.2 0.0 - 0.5 K/uL   Basophils Relative 0 %   Basophils Absolute 0.0 0.0 - 0.1 K/uL   Immature Granulocytes 0 %   Abs Immature Granulocytes 0.04 0.00 - 0.07 K/uL    Comment: Performed at [facility], [address] [place], [place] [address]  Magnesium     Status: None   Collection Time: 08/26/23  4:18 PM  Result Value Ref Range   Magnesium 2.2 1.7 - 2.4 mg/dL    Comment: Performed at [facility], [address] [place], [place] [address]  I-Stat Chem 8, ED     Status: Abnormal   Collection Time: 08/26/23  4:45 PM  Result Value Ref Range   Sodium 136 135 - 145 mmol/L   Potassium 5.7 (H) 3.5 - 5.1 mmol/L   Chloride 104 98 - 111 mmol/L   BUN 60 (H) 8 - 23 mg/dL   Creatinine, Ser 2.53 (H) 0.61 - 1.24 mg/dL    Glucose, Bld 664 (H) 70 - 99 mg/dL    Comment: Glucose reference range applies only to samples taken after fasting for at least 8 hours.  Calcium, Ion 1.00 (L) 1.15 - 1.40 mmol/L   TCO2 28 22 - 32 mmol/L   Hemoglobin 15.3 13.0 - 17.0 g/dL   HCT 96.2 95.2 - 84.1 %  CBG monitoring, ED     Status: None   Collection Time: 08/27/23 12:08 AM  Result Value Ref Range   Glucose-Capillary 96 70 - 99 mg/dL    Comment: Glucose reference range applies only to samples taken after fasting for at least 8 hours.  CBG monitoring, ED     Status: None   Collection Time: 08/27/23  6:34 AM  Result Value Ref Range   Glucose-Capillary 91 70 - 99 mg/dL    Comment: Glucose reference range applies only to samples taken after fasting for at least 8 hours.   CT Chest Wo Contrast Result Date: 08/26/2023 CLINICAL DATA:  Pneumomediastinum seen on CT abdomen. EXAM: CT CHEST WITHOUT CONTRAST TECHNIQUE: Multidetector CT imaging of the chest was performed following the standard protocol without IV contrast. RADIATION DOSE REDUCTION: This exam was performed according to the departmental dose-optimization program which includes automated exposure control, adjustment of the mA and/or kV according to patient size and/or use of iterative reconstruction technique. COMPARISON:  CT abdomen and pelvis earlier today FINDINGS: Cardiovascular: Normal heart size. No pericardial effusion. Coronary artery and aortic atherosclerotic calcification. Mediastinum/Nodes: Unchanged pneumomediastinum within the anterior mediastinum similar to same day CT abdomen and pelvis likely related to pneumomediastinum. No additional air or fluid within the mediastinum. Trachea and esophagus are unremarkable. No thoracic adenopathy. Lungs/Pleura: No focal consolidation, pleural effusion, or pneumothorax. Similar focal area of fat attenuation density along the left posterior pleural surface extending into the major fissure (series 2/image 49 several small pulmonary  nodules measuring up to 4 mm (in the left lower lobe series 2/image 99). No follow-up is recommended. Vague 8 mm ground-glass nodule in the right upper lobe (series 2/image 67) is unchanged from 12/04/2015 compatible with benign etiology. No follow-up recommended. The airways are patent. Upper Abdomen: Percutaneous gastrostomy tube. Similar pneumoperitoneum in the anterior abdomen Musculoskeletal: No acute fracture. IMPRESSION: 1. Unchanged pneumomediastinum within the anterior mediastinum similar to same day CT abdomen and pelvis likely related to pneumoperitoneum from gastrostomy tube placement. 2. Similar pneumoperitoneum in the anterior abdomen. 3. Aortic Atherosclerosis (ICD10-I70.0). Electronically Signed   By: Minerva Fester M.D.   On: 08/26/2023 20:01   CT ABDOMEN PELVIS W CONTRAST Result Date: 08/26/2023 CLINICAL DATA:  Postop abdominal pain. Vomiting. Concern for feeding tube displacement. EXAM: CT ABDOMEN AND PELVIS WITH CONTRAST TECHNIQUE: Multidetector CT imaging of the abdomen and pelvis was performed using the standard protocol following bolus administration of intravenous contrast. RADIATION DOSE REDUCTION: This exam was performed according to the departmental dose-optimization program which includes automated exposure control, adjustment of the mA and/or kV according to patient size and/or use of iterative reconstruction technique. CONTRAST:  OMNIPAQUE IOHEXOL 300 MG/ML  SOLN COMPARISON:  None recent. Abdominopelvic CT 09/28/2020 and 06/13/2019. FINDINGS: Lower chest: Clear lung bases. No significant pleural or pericardial effusion. There is no basilar pneumothorax. However, anterior pneumomediastinum noted. There is mild aortic atherosclerosis. Hepatobiliary: The liver is normal in density without suspicious focal abnormality. Stable 9 mm cystic lesion in the left hepatic lobe on image 14/2. No significant biliary dilatation status post cholecystectomy. Pancreas: Unremarkable. No  pancreatic ductal dilatation or surrounding inflammatory changes. Spleen: Normal in size without focal abnormality. Adrenals/Urinary Tract: Stable right adrenal adenoma measuring 1.5 cm on image 21/2. Although this has a nonspecific density on this  contrast enhanced study, it measured 4 HU on previous CT consistent with a lipid rich adenoma. No follow-up recommended. The left adrenal gland appears normal. No evidence of urinary tract calculus, suspicious renal lesion or hydronephrosis. There are multiple small renal cystic lesions bilaterally for which no specific follow-up imaging is recommended. The bladder appears unremarkable for its degree of distention. Stomach/Bowel: No enteric contrast administered. There is a percutaneous gastrostomy tube with the retention bulb in the gastric antrum. The stomach is decompressed. There is a small amount of extraluminal air anteriorly in the upper abdomen between the anterior abdominal wall and the liver. There is no focal fluid collection. No bowel distension, wall thickening or surrounding inflammation. The appendix appears normal. Vascular/Lymphatic: There are no enlarged abdominal or pelvic lymph nodes. Aortic and branch vessel atherosclerosis without evidence of large vessel occlusion or aneurysm. The portal, superior mesenteric and splenic veins are patent. Reproductive: The prostate gland appears unremarkable. Other: Postsurgical changes in the anterior abdominal wall consistent with previous ventral hernia repair. No recurrent hernia identified. Stable prominent fat in both inguinal canals with extension of a small knuckle of small bowel into the right-sided hernia. No evidence of incarceration or obstruction. As above, small amount of pneumoperitoneum in the anterior upper abdomen which may relate to the patient's percutaneous G-tube, especially if recently placed. Musculoskeletal: No acute or significant osseous findings. Multilevel spondylosis. IMPRESSION: 1. The  percutaneous gastrostomy tube appears appropriately positioned with the retention bulb in the gastric antrum. 2. Small amount of pneumoperitoneum in the anterior upper abdomen which may relate to the patient's percutaneous G-tube, especially if recently placed. Mild anterior pneumomediastinum may be related. Suggest chest radiographic correlation. 3. No evidence of bowel obstruction or other acute findings. 4. Stable right adrenal adenoma. 5. Stable bilateral inguinal hernias containing fat and a small knuckle of small bowel on the right. No evidence of incarceration or obstruction. 6.  Aortic Atherosclerosis (ICD10-I70.0). Electronically Signed   By: Carey Bullocks M.D.   On: 08/26/2023 17:59    Pending Labs Unresulted Labs (From admission, onward)     Start     Ordered   08/26/23 2250  Hemoglobin A1c  Add-on,   AD        08/26/23 2250            Vitals/Pain Today's Vitals   08/27/23 0619 08/27/23 0700 08/27/23 0827 08/27/23 0911  BP:  95/66  121/62  Pulse:  (!) 57  64  Resp:    (!) 22  Temp:    97.7 F (36.5 C)  TempSrc:    Oral  SpO2:  93%  96%  Weight:      Height:      PainSc: Asleep  2  0-No pain    Isolation Precautions No active isolations  Medications Medications  HYDROmorphone (DILAUDID) injection 0.5 mg (has no administration in time range)  pantoprazole (PROTONIX) injection 40 mg (has no administration in time range)  methimazole (TAPAZOLE) tablet 10 mg (has no administration in time range)  insulin aspart (novoLOG) injection 0-9 Units ( Subcutaneous Not Given 08/27/23 0636)  ondansetron (ZOFRAN) tablet 4 mg (has no administration in time range)    Or  ondansetron (ZOFRAN) injection 4 mg (has no administration in time range)  polyethylene glycol (MIRALAX / GLYCOLAX) packet 17 g (has no administration in time range)  0.9 %  sodium chloride infusion ( Intravenous New Bag/Given 08/26/23 2322)  iohexol (OMNIPAQUE) 300 MG/ML solution 100 mL (100 mLs Intravenous  Contrast Given  08/26/23 1658)  lactated ringers bolus 1,000 mL (0 mLs Intravenous Stopped 08/27/23 0153)  pantoprazole (PROTONIX) injection 40 mg (40 mg Intravenous Given 08/26/23 2148)  piperacillin-tazobactam (ZOSYN) IVPB 3.375 g (0 g Intravenous Stopped 08/26/23 2219)  sterile water (preservative free) injection (10 mLs  Given 08/26/23 2149)    Mobility walks        R Recommendations: See Admitting Provider Note  Report given to: Noreene Larsson

## 2023-08-27 NOTE — Progress Notes (Signed)
 Pt received to 6N27 from [facility] via [facility] accompanied by wife.

## 2023-08-27 NOTE — Progress Notes (Signed)
 CCMD called to verify monitor placed, box 11.

## 2023-08-27 NOTE — Progress Notes (Signed)
 Pt brought down for esophagram, see report.  Incidentally noted gastrostomy tube balloon has migrated into the pylorus, potentially causing mechanical gastric outlet obstruction. Gastrostomy tube was retracted until balloon snug against the abdominal wall. Bumper secured at the 3-4 cm mark and pt educated on keeping external bumper snug to skin. G-tube can be used without restriction from IR standpoint.  Brayton El PA-C Interventional Radiology 08/27/2023 12:59 PM

## 2023-08-27 NOTE — Progress Notes (Signed)
 Progress Note   Patient: Gerald Salas. RUE:[id number] DOB: [birth year] DOA: 08/26/2023     0 DOS: the patient was seen and examined on 08/27/2023   Brief hospital admission narrative: As per H&P written by Dr. Mariea Clonts on 08/26/23 Gerald Salas. is a 73 y.o. male with medical history significant for COPD, diabetes mellitus, head and neck cancer. Patient presented to the ED with reports of problems involving his gastric tube.  Tube was placed at the [facility] about a week ago, as he has not been able to eat or drink status post radiation therapy for head and neck cancer.  He takes water only through his mouth, nutrition and meds through his G-tube Patient reported yesterday evening he vomited, and today after feeding him through his tube, his spouse flushed his tube, and he vomited again.  Spouse also noted some drainage around his G-tube.   He otherwise denies chest or abdominal pain, no difficulty breathing.  No fevers no chills.   ED Course: Stable vitals. CT abdomen and pelvis with contrast-appropriately positioned G-tube, small amount of pneumoperitoneum. CT chest without contrast-pneumomediastinum within the anterior mediastinum. EDP talked to cardiac thoracic surgery, recommended esophagram.  Unfortunately this cannot be done here at [facility] till Monday hence admission to [facility].  Assessment and Plan: * Pneumomediastinum (HCC) -As seen on CT chest without contrast.  Presenting with problems with his G-tube, vomiting.  He denies chest pain or difficulty breathing.   -Case discussed with cardiothoracic surgery- Dr. Cliffton Asters, with recommendations for patient to be transferred to [facility] for esophagogram and further evaluation by cardiothoracic surgery. -Maintain n.p.o.; continue IV PPI. -Continue Zosyn for pneumomediastinum, follow cardiothoracic surgery further recommendations and esophagogram results.  G tube feedings (HCC) -Unable to swallow secondary to  esophageal radiation -Patient expressed having some trouble with tube feedings; assess stability and if needed involve IR for further management.  Laryngeal cancer (HCC) -Managed at [facility].  Diagnosed 04/2023, status post 29 days of radiation therapy.  Reports he completed radiation therapy 2 weeks ago.  Did not undergo surgery or chemotherapy.  Records not available in Care Everywhere. -Continue IV Dilaudid as needed for pain -Continue patient follow-up with oncology service.  DM (diabetes mellitus) (HCC) -Continue to hold Jardiance at the moment -Follow CBG fluctuation and adjust hypoglycemic regimen as needed -Continue sliding scale insulin -Updating A1c.   Subjective:  Afebrile currently, no CP, no SOB. Reporting intermittent nausea; no vomiting currently.  Physical Exam: Vitals:   08/27/23 0500 08/27/23 0600 08/27/23 0700 08/27/23 0911  BP: (!) 102/59 118/68 95/66 121/62  Pulse: (!) 56 60 (!) 57 64  Resp: 18 18  (!) 22  Temp:  98.3 F (36.8 C)  97.7 F (36.5 C)  TempSrc:    Oral  SpO2: 92% 96% 93% 96%  Weight:      Height:       General exam: Alert, awake, oriented x 3, hoarse on exam, reporting no CP or SOB. Respiratory system: no wheezing or crackles, positive rhonchi bilaterally. Cardiovascular system:RRR. No rubs or gallops; no JVD Gastrointestinal system: Abdomen is nondistended, soft and without guarding. Decreased bu positive BS; peg tube in placed. Central nervous system: No focal neurological deficits. Extremities: No cyanosis, clubbing or edema. Skin: No petechiae; radiation changes in his neck (anterior aspect) appreciated. Psychiatry: flat affect  Data Reviewed: CBG's: 96>> 91 A1C : pending Lipase 60 CBC: WBC's 10.7, Hgb 14.8 and platelets count 268K CMET:sodium 138, potassium 4.5, chloride  100, CO2 27, BUN 42, Cr 1.50, ALT 82, AST 41, alk phos 24 and GFR 49   Family Communication: wife at bedside  Disposition: Status is: Observation The  patient remains OBS appropriate and will d/c before 2 midnights.   Planned Discharge Destination: to be determined.   Time spent: 50 minutes  Author: Vassie Loll, MD 08/27/2023 9:27 AM  For on call review [email or web address].

## 2023-08-28 DIAGNOSIS — J982 Interstitial emphysema: Secondary | ICD-10-CM | POA: Diagnosis not present

## 2023-08-28 LAB — CBC WITH DIFFERENTIAL/PLATELET
Abs Immature Granulocytes: 0.03 10*3/uL (ref 0.00–0.07)
Basophils Absolute: 0 10*3/uL (ref 0.0–0.1)
Basophils Relative: 0 %
Eosinophils Absolute: 0.4 10*3/uL (ref 0.0–0.5)
Eosinophils Relative: 6 %
HCT: 35.9 % — ABNORMAL LOW (ref 39.0–52.0)
Hemoglobin: 12.6 g/dL — ABNORMAL LOW (ref 13.0–17.0)
Immature Granulocytes: 1 %
Lymphocytes Relative: 25 %
Lymphs Abs: 1.6 10*3/uL (ref 0.7–4.0)
MCH: 32.6 pg (ref 26.0–34.0)
MCHC: 35.1 g/dL (ref 30.0–36.0)
MCV: 93 fL (ref 80.0–100.0)
Monocytes Absolute: 0.6 10*3/uL (ref 0.1–1.0)
Monocytes Relative: 9 %
Neutro Abs: 3.8 10*3/uL (ref 1.7–7.7)
Neutrophils Relative %: 59 %
Platelets: 223 10*3/uL (ref 150–400)
RBC: 3.86 MIL/uL — ABNORMAL LOW (ref 4.22–5.81)
RDW: 11.4 % — ABNORMAL LOW (ref 11.5–15.5)
WBC: 6.4 10*3/uL (ref 4.0–10.5)
nRBC: 0 % (ref 0.0–0.2)

## 2023-08-28 LAB — COMPREHENSIVE METABOLIC PANEL
ALT: 49 U/L — ABNORMAL HIGH (ref 0–44)
AST: 25 U/L (ref 15–41)
Albumin: 2.8 g/dL — ABNORMAL LOW (ref 3.5–5.0)
Alkaline Phosphatase: 18 U/L — ABNORMAL LOW (ref 38–126)
Anion gap: 8 (ref 5–15)
BUN: 22 mg/dL (ref 8–23)
CO2: 25 mmol/L (ref 22–32)
Calcium: 9.2 mg/dL (ref 8.9–10.3)
Chloride: 103 mmol/L (ref 98–111)
Creatinine, Ser: 1.09 mg/dL (ref 0.61–1.24)
GFR, Estimated: >60 mL/min (ref >60)
Glucose, Bld: 94 mg/dL (ref 70–99)
Potassium: 4 mmol/L (ref 3.5–5.1)
Sodium: 136 mmol/L (ref 135–145)
Total Bilirubin: 0.7 mg/dL (ref 0.0–1.2)
Total Protein: 6.2 g/dL — ABNORMAL LOW (ref 6.5–8.1)

## 2023-08-28 LAB — GLUCOSE, CAPILLARY
Glucose-Capillary: 79 mg/dL (ref 70–99)
Glucose-Capillary: 95 mg/dL (ref 70–99)
Glucose-Capillary: 92 mg/dL (ref 70–99)
Glucose-Capillary: 95 mg/dL (ref 70–99)

## 2023-08-28 MED ORDER — THIAMINE MONONITRATE 100 MG PO TABS: 100.0000 mg | ORAL_TABLET | Freq: Every day | ORAL | Status: DC

## 2023-08-28 MED ORDER — OSMOLITE 1.5 CAL PO LIQD: 237.0000 mL | Freq: Three times a day (TID) | ORAL | Status: DC

## 2023-08-28 MED ORDER — SILVER SULFADIAZINE 1 % EX CREA
1.0000 | TOPICAL_CREAM | Freq: Every day | CUTANEOUS | Status: DC
  Filled 2023-08-28 (×2): qty 85

## 2023-08-28 MED ORDER — OXYCODONE HCL 5 MG PO TABS: 5.0000 mg | ORAL_TABLET | ORAL | Status: DC | PRN

## 2023-08-28 MED ORDER — ADULT MULTIVITAMIN W/MINERALS CH: 1.0000 | ORAL_TABLET | Freq: Every day | ORAL | Status: DC

## 2023-08-28 MED ORDER — ASPIRIN 81 MG PO TBEC
81.0000 mg | DELAYED_RELEASE_TABLET | Freq: Every day | ORAL | Status: DC
Start: 2023-08-28 — End: 2023-08-29
  Administered 2023-08-28: 81 mg via ORAL
  Filled 2023-08-28: qty 1

## 2023-08-28 MED ORDER — OSMOLITE 1.5 CAL PO LIQD: 355.0000 mL | Freq: Three times a day (TID) | ORAL | Status: DC

## 2023-08-28 MED ORDER — DOXEPIN HCL 10 MG/ML PO CONC: 25.0000 mg | Freq: Two times a day (BID) | ORAL | Status: DC | PRN

## 2023-08-28 MED ORDER — POLYETHYLENE GLYCOL 3350 17 G PO PACK
17.0000 g | PACK | Freq: Every day | ORAL | Status: DC
  Filled 2023-08-28: qty 1

## 2023-08-28 MED ORDER — ATORVASTATIN CALCIUM 80 MG PO TABS
80.0000 mg | ORAL_TABLET | Freq: Every day | ORAL | Status: DC
  Administered 2023-08-28: 80 mg via ORAL
  Filled 2023-08-28: qty 1

## 2023-08-28 MED ORDER — OSMOLITE 1.5 CAL PO LIQD
120.0000 mL | Freq: Three times a day (TID) | ORAL | Status: AC
  Administered 2023-08-28 – 2023-08-29 (×2): 120 mL

## 2023-08-28 MED ORDER — ADULT MULTIVITAMIN W/MINERALS CH
1.0000 | ORAL_TABLET | Freq: Every day | ORAL | Status: DC
  Administered 2023-08-28: 1
  Filled 2023-08-28: qty 1

## 2023-08-28 MED ORDER — THIAMINE MONONITRATE 100 MG PO TABS
100.0000 mg | ORAL_TABLET | Freq: Every day | ORAL | Status: DC
  Administered 2023-08-28: 100 mg
  Filled 2023-08-28: qty 1

## 2023-08-28 MED ORDER — FREE WATER
100.0000 mL | Freq: Three times a day (TID) | Status: DC
  Administered 2023-08-28 – 2023-08-29 (×3): 100 mL

## 2023-08-28 NOTE — Progress Notes (Signed)
   08/28/23 1342  TOC Brief Assessment  Insurance and Status Reviewed  Patient has primary care physician Yes  Home environment has been reviewed spouse  Prior level of function: PEG tube, states he has tube feeding from [facility]  Prior/Current Home Services No current home services  Social Drivers of Health Review SDOH reviewed no interventions necessary  Readmission risk has been reviewed Yes  Transition of care needs no transition of care needs at this time

## 2023-08-28 NOTE — Progress Notes (Signed)
     [address]       [place] [address]             [phone]       No leak on esophagram [facility] for clears No need for CT follow-up  Taylen Wendland O Amaira Safley

## 2023-08-28 NOTE — Progress Notes (Addendum)
 TRH ROUNDING NOTE [name]. ZOX:[id number]  DOB: [birth year]  DOA: 08/26/2023  PCP: [facility]  08/28/2023,7:14 AM  LOS: 0 days    Code Status: full code   From:  home   Current Dispo: likely return home   73 year old white male Nonobstructive CAD 2012 based on heart cath with 30% LAD stenosis, EF 60% Cholelithiasis status post lap chole 2013 Previous TIA with RUE RLE weakness Hyperthyroid prior Graves' disease CKD 2  right vocal cord squamous cell CA diagnosed 04/2023 [not surgical candidate definitive XRT 29 sessions completed 08/11/2023]  [facility] 08/18/2023 because of poor p.o. and mucositis  G-tube was placed 65 [place]--- he is also followed by palliative medicine for pain control symptom management Brought to [facility] ED 3/1 with emesis from tube feed-wife/G-tube subsequent emesis and drainage around G-tube insertion site Workup reveals appropriately positioned G-tube small pneumoperitoneum pneumomediastinum EDP discussed with thoracic surgery at [facility] was started on Zosyn maintain n.p.o. started on PPI 3/2 transferred to [facility], esophagealgram performed:- gastrostomy tube balloon migrated into pylorus on eval causing mechanical gastric outlet obstruction gastrostomy tube retracted on 12 balloon snug against abdominal binder   Plan  Pneumomediastinum Probably related to recent instrumentation PEG tube and for adherence of it which is now corrected based on esophagram 3/2 Appreciate CVTS input 3/3-no oral antibiotics or further workup--as unlikely to be infectious etc. antibiotics withheld  AKI on admission + hyperkalemia Second diet poor p.o. intake etc. Currently is saline locked-reimplement feeds/fluids-Labs AM  Nonobstructive CAD, previous TIA Can resume aspirin 81 atorvastatin 80 and other GDMT  Squamous cell CA vocal cord not surgical candidate with prior XRT and resulting mucositis Resumed full liquid diet and  graduated upward Dietitian consulted to help reinitiate PEG feeds Add back silver sulfadiazine cream to moist desquamation of anterior neck as needed, doxepin 25 twice daily by mouth solution for throat pain Outpatient follow-up at the [place] where it looks like this was managed Can continue oxycodone 5 every 4 as needed for moderate pain  ?  DM TY 2 CBGs are relatively low does not need Jardiance will DC  Possible Graves' disease-hyperthyroid state Resume methimazole 10 by tube   DVT prophylaxis: SCD  Status is: Observation The patient remains OBS appropriate and will d/c before 2 midnights.      Subjective: Looks fair asking to eat tells me he usually is on liquid diet no fever chills no nausea vomiting no further abdominal issues Breathing is good  Objective + exam Vitals:   08/27/23 1629 08/27/23 2124 08/28/23 0011 08/28/23 0500  BP: 106/65 113/66 (!) 104/58 108/68  Pulse: 61 65 60 64  Resp: 20 18 17 17   Temp: 98.1 F (36.7 C) (!) 97.4 F (36.3 C) 98.2 F (36.8 C) 98.3 F (36.8 C)  TempSrc: Oral Oral Oral Oral  SpO2: 96% 99% 97% 96%  Weight:      Height:       Filed Weights   08/26/23 1507  Weight: 83.5 kg    Examination: Chest clear no added sound, 8 to 9 cm anterior neck scar with wound healing and scab Power 5/5 bilaterally Voice strong PEG tube in place abdomen soft no rebound no guarding No lower extremity edema ROM intact Psych euthymic coherent  Data Reviewed: reviewed   CBC    Component Value Date/Time   WBC 10.7 (H) 08/26/2023 1618   RBC 4.58 08/26/2023 1618   HGB 15.3 08/26/2023 1645   HCT  45.0 08/26/2023 1645   PLT 268 08/26/2023 1618   MCV 95.9 08/26/2023 1618   MCH 32.3 08/26/2023 1618   MCHC 33.7 08/26/2023 1618   RDW 11.4 (L) 08/26/2023 1618   LYMPHSABS 1.4 08/26/2023 1618   MONOABS 0.7 08/26/2023 1618   EOSABS 0.2 08/26/2023 1618   BASOSABS 0.0 08/26/2023 1618      Latest Ref Rng & Units 08/26/2023    4:45 PM 08/26/2023    4:18  PM 09/28/2020   12:56 AM  CMP  Glucose 70 - 99 mg/dL 161  096  045   BUN 8 - 23 mg/dL 60  42  26   Creatinine 0.61 - 1.24 mg/dL 4.09  8.11  9.14   Sodium 135 - 145 mmol/L 136  138  140   Potassium 3.5 - 5.1 mmol/L 5.7  4.5  3.5   Chloride 98 - 111 mmol/L 104  100  109   CO2 22 - 32 mmol/L  27  22   Calcium 8.9 - 10.3 mg/dL  9.9  9.0   Total Protein 6.5 - 8.1 g/dL  8.2    Total Bilirubin 0.0 - 1.2 mg/dL  0.7    Alkaline Phos 38 - 126 U/L  24    AST 15 - 41 U/L  41    ALT 0 - 44 U/L  82      Scheduled Meds:  insulin aspart  0-9 Units Subcutaneous Q8H   methimazole  10 mg Per Tube Daily   pantoprazole (PROTONIX) IV  40 mg Intravenous Q24H   Continuous Infusions:  Time  44  Rhetta Mura, MD  [facility]

## 2023-08-28 NOTE — Progress Notes (Signed)
 Mobility Specialist Progress Note:   08/28/23 1441  Mobility  Activity Ambulated with assistance in hallway  Level of Assistance  (MinG)  Assistive Device Front wheel walker  Distance Ambulated (ft) 460 ft  Activity Response Tolerated well  Mobility Referral Yes  Mobility visit 1 Mobility  Mobility Specialist Start Time (ACUTE ONLY) 1430  Mobility Specialist Stop Time (ACUTE ONLY) 1441  Mobility Specialist Time Calculation (min) (ACUTE ONLY) 11 min   Pt received in bed, agreeable to mobility. Denied any dizziness during ambulation, asx throughout. Pt was able to tolerate increased gait distance. Pt returned to bed with call bell in reach and all needs met.   Leory Plowman  Mobility Specialist Please contact via [employer] office at [phone]

## 2023-08-28 NOTE — Progress Notes (Addendum)
 Initial Nutrition Assessment  DOCUMENTATION CODES:   Severe malnutrition in context of chronic illness  INTERVENTION:  Initiate Bolus tube feeding via PEG tube: Goal of 6 cartons Osmolite 1.5  (1440 ml per day) Start with 1/2 carton tube feeds if tolerated increase by 1/2 can every 2 feeds until goal of 1.5 cartons per feed is achieved.  30 ml FWF before and after feeds 100 ml FWF Q8H   Provides 2130  kcal, 90 gm protein, 1086 ml free water daily (1746 ml water TF + FWF rest by PO)  Recommend updated standing weight  100 mg Thiamine daily per tube  MVI with minerals daily per tube  Recommend adding Q4H insulin coverage for tube feeds  Monitor magnesium, potassium, and phosphorus daily for at least 3 days, MD to replete as needed, as pt is at risk for refeeding syndrome.   NUTRITION DIAGNOSIS:   Severe Malnutrition related to cancer and cancer related treatments as evidenced by severe muscle depletion, moderate fat depletion, energy intake < or equal to 75% for > or equal to 1 month.   GOAL:   Patient will meet greater than or equal to 90% of their needs   MONITOR:   TF tolerance, Supplement acceptance, I & O's, Labs, Weight trends  REASON FOR ASSESSMENT:   Consult Assessment of nutrition requirement/status, Enteral/tube feeding initiation and management  ASSESSMENT:  73 y.o male with PMH of COPD, DM, GERD, high cholesterol, head and neck cancer s/p radiation and PEG tube. Presented with vomiting after feedings through PEG tube.  Diagnosed with head and neck cancer in 04/2023, S/P radiation 29 days. Competed radiation 2 weeks ago. Since then patient reports he has not been able to eat solid foods but has been able to drink some fluids. PEG placed 08/21/23. Patient does not know what TF regimen he was following. Called Wife Kathie Rhodes who reports he was given 4 cartons of TwoCal HN with 30 ml FWF before and after feedings.  Has been drinking chicken and beef broth here.   Care  Everywhere notes not available. Patient and spouse endorse weight loss. His UBW has been around 185 lbs but since starting treatment he weighs around 163 lbs. Weight on file is carried over from last known weight, messaged RN to get updated standing weight.   Patient came in with vomiting after feeds and water flushes. PEG tube balloon was found to be mispositioned and was retracted. RD consulted for initiation of Bolus tube feeds through PEG. Unfortunately we do not carry TwoCal HN, will have to use an equivalent. Patient will be at refeeding risk due to minimal nutrition since January. Monitor lytes.   Admit weight: 83.5 kg - Carried dover not accurate  Current weight: 83.5 kg    Nutritionally Relevant Medications: Scheduled Meds:  feeding supplement (OSMOLITE 1.5 CAL)  120 mL Per Tube TID PC & HS   [START ON 08/29/2023] feeding supplement (OSMOLITE 1.5 CAL)  237 mL Per Tube TID AC & HS   [START ON 08/29/2023] feeding supplement (OSMOLITE 1.5 CAL)  355 mL Per Tube TID AC & HS   insulin aspart  0-9 Units Subcutaneous Q8H   multivitamin with minerals  1 tablet Per Tube Daily   thiamine  100 mg Per Tube Daily   Labs Reviewed: ALT 49, Alkaline phosphatase 18 CBG ranges from 79-107 mg/dL over the last 24 hours HgbA1c 5.8  NUTRITION - FOCUSED PHYSICAL EXAM:  Flowsheet Row Most Recent Value  Orbital Region Moderate depletion  Upper  Arm Region Moderate depletion  Thoracic and Lumbar Region Moderate depletion  Buccal Region Moderate depletion  Temple Region Moderate depletion  Clavicle Bone Region Severe depletion  Clavicle and Acromion Bone Region Severe depletion  Scapular Bone Region Moderate depletion  Dorsal Hand Moderate depletion  Patellar Region Moderate depletion  Anterior Thigh Region Moderate depletion  Posterior Calf Region Severe depletion  Edema (RD Assessment) None  Hair Reviewed  Eyes Reviewed  Mouth Reviewed  Skin Reviewed  Nails Reviewed       Diet Order:   Diet  Order             Diet full liquid Room service appropriate? Yes; Fluid consistency: Thin  Diet effective now                   EDUCATION NEEDS:   No education needs have been identified at this time  Skin:  Skin Assessment: Reviewed RN Assessment  Last BM:  08/26/23  Height:   Ht Readings from Last 1 Encounters:  08/26/23 5\' 11"  (1.803 m)    Weight:   Wt Readings from Last 1 Encounters:  08/26/23 83.5 kg    Ideal Body Weight:  78.2 kg  BMI:  Body mass index is 25.67 kg/m.  Estimated Nutritional Needs:   Kcal:  2000-2200 kcal  Protein:  90-110 gm  Fluid:  >2L/day   Elliot Dally, RD Registered Dietitian  See Amion for more information

## 2023-08-28 NOTE — Care Management Obs Status (Signed)
 MEDICARE OBSERVATION STATUS NOTIFICATION   Patient Details  Name: Gerald Salas. MRN: [id number] Date of Birth: [birth year]   Medicare Observation Status Notification Given:  Yes    Kingsley Plan, RN 08/28/2023, 1:40 PM

## 2023-08-28 NOTE — Progress Notes (Signed)
 Mobility Specialist Progress Note:   08/28/23 1128  Mobility  Activity Ambulated with assistance in hallway  Level of Assistance Contact guard assist, steadying assist  Assistive Device Front wheel walker  Distance Ambulated (ft) 275 ft  Activity Response Tolerated well  Mobility Referral Yes  Mobility visit 1 Mobility  Mobility Specialist Start Time (ACUTE ONLY) 1030  Mobility Specialist Stop Time (ACUTE ONLY) 1040  Mobility Specialist Time Calculation (min) (ACUTE ONLY) 10 min   Pre Mobility: 63 HR Post Mobility: 77 HR   Pt received in chair, agreeable to mobility. Pt displayed mild unsteadiness d/t dizziness during ambulation requiring CG. Towards EOS pt stated that this resolved. Pt denied any pain during ambulation, asx throughout. Pt left in chair with call bell in reach and all needs met. Wife present.   Leory Plowman  Mobility Specialist Please contact via [employer] office at [phone]

## 2023-08-29 DIAGNOSIS — J982 Interstitial emphysema: Secondary | ICD-10-CM | POA: Diagnosis not present

## 2023-08-29 DIAGNOSIS — E43 Unspecified severe protein-calorie malnutrition: Secondary | ICD-10-CM | POA: Insufficient documentation

## 2023-08-29 LAB — MAGNESIUM: Magnesium: 2 mg/dL (ref 1.7–2.4)

## 2023-08-29 LAB — PHOSPHORUS: Phosphorus: 4.6 mg/dL (ref 2.5–4.6)

## 2023-08-29 LAB — GLUCOSE, CAPILLARY
Glucose-Capillary: 88 mg/dL (ref 70–99)
Glucose-Capillary: 90 mg/dL (ref 70–99)

## 2023-08-29 LAB — POTASSIUM: Potassium: 4 mmol/L (ref 3.5–5.1)

## 2023-08-29 MED ORDER — FREE WATER: 100.0000 mL | Freq: Three times a day (TID) | Status: AC

## 2023-08-29 NOTE — Plan of Care (Signed)

## 2023-08-29 NOTE — Discharge Summary (Signed)
 Physician Discharge Summary  Gerald Salas. ZOX:[id number] DOB: [birth year] DOA: 08/26/2023  PCP: [facility]  Admit date: 08/26/2023 Discharge date: 08/29/2023  Time spent: 27 minutes  Recommendations for Outpatient Follow-up:  Requires outpatient follow-up with [facility] oncology with regards to underlying vocal cord cancer Get Chem-12 CBC in about 1 week Morning precautions given to patient May require insulin coverage in the outpatient setting can consider metformin if sugars are above 180 on checks-suggest giving patient a glucometer  Discharge Diagnoses:  MAIN problem for hospitalization   Pneumomediastinum likely secondary to malfunctioning G-tube  Please see below for itemized issues addressed in HOpsital- refer to other progress notes for clarity if needed  Discharge Condition: Improved  Diet recommendation: Full liquid diet with PEG tube feeds resumption  Filed Weights   08/26/23 1507 08/29/23 0600  Weight: 83.5 kg 70.8 kg    History of present illness:  73 year old white male Nonobstructive CAD 2012 based on heart cath with 30% LAD stenosis, EF 60% Cholelithiasis status post lap chole 2013 Previous TIA with RUE RLE weakness Hyperthyroid prior Graves' disease CKD 2  right vocal cord squamous cell CA diagnosed 04/2023 [not surgical candidate definitive XRT 29 sessions completed 08/11/2023]   [facility]18/2023 because of poor p.o. and mucositis  G-tube was placed 57 [place]--- he is also followed by palliative medicine for pain control symptom management Brought to [facility] ED 3/1 with emesis from tube feed-wife/G-tube subsequent emesis and drainage around G-tube insertion site Workup reveals appropriately positioned G-tube small pneumoperitoneum pneumomediastinum EDP discussed with thoracic surgery at [facility] was started on Zosyn maintain n.p.o. started on PPI 3/2 transferred to [facility], esophagealgram performed:-  gastrostomy tube balloon migrated into pylorus on eval causing mechanical gastric outlet obstruction gastrostomy tube retracted on 12 balloon snug against abdominal binder     Plan   Pneumomediastinum Probably related to recent instrumentation PEG tube and for adherence of it which is now corrected based on esophagram 3/2 Appreciate CVTS input 3/3-no oral antibiotics or further workup--as unlikely to be infectious etc. antibiotics withheld Patient stabilized during hospital stay and was resumed on usual feeds etc. as below   AKI on admission + hyperkalemia Secondary to decreased diet, poor p.o. intake etc. Transiently IV fluid resumed feeds started free water and this resolved Will need labs in about a week in the outpatient setting   Nonobstructive CAD, previous TIA Can resume aspirin 81 atorvastatin 80 and other GDMT   Squamous cell CA vocal cord not surgical candidate with prior XRT and resulting mucositis Resumed full liquid diet and graduated upward--Dietitian consulted-will reinitiate up to 6 cartons of Osmolite 1440 mL/day with free water Q8 and can be followed outpatient Resumed silver sulfadiazine cream to moist desquamation of anterior neck as needed, doxepin 25 twice daily by mouth solution for throat pain Outpatient follow-up at the [place] where it looks like this was managed Can continue oxycodone 5 every 4 as needed for moderate pain, can continue Butrans as well   ?  DM TY 2 CBGs are relatively low does not need Jardiance will DC-decided ultimately to reinitiate given tube feeds as above   Possible Graves' disease-hyperthyroid state Resume methimazole 10 by tube Outpatient TFTs etc. as per primary     Discharge Exam: Vitals:   08/29/23 0539 08/29/23 0756  BP: (!) 103/58 105/61  Pulse: 62 60  Resp: 17   Temp: 98.1 F (36.7 C) 97.7 F (36.5 C)  SpO2: 99% 94%  Subj on day of d/c   Looks well has been hungry for the past day which is a good sign as was not that  way previously Overall looks well  General Exam on discharge  EOMI NCAT no focal deficit no icterus no pallor-anterior neck is cleaner scab looks better Chest clear no wheeze rales rhonchi ROM intact moving 4 limbs equally Abdomen soft PEG tube well positioned in place no distress Power 5/5   Discharge Instructions   Discharge Instructions     Diet - low sodium heart healthy   Complete by: As directed    Discharge instructions   Complete by: As directed    Continue your feeds as usual.  [facility] If u have further Tube issues or nausea and vomit, please go back tot he Emergency room   Increase activity slowly   Complete by: As directed       Allergies as of 08/29/2023       Reactions   Codeine Itching, Rash, Other (See Comments)   Veins pop out real bad   Tylox [oxycodone-acetaminophen] Itching   Oxycodone-acetaminophen Itching   Tyloxapol Rash        Medication List     TAKE these medications    aspirin EC 81 MG tablet Take 81 mg by mouth daily. Swallow whole.   atorvastatin 80 MG tablet Commonly known as: LIPITOR Take 1 tablet by mouth at bedtime.   bisacodyl 5 MG EC tablet Commonly known as: DULCOLAX Take 10 mg by mouth daily.   buprenorphine 10 MCG/HR Ptwk Commonly known as: BUTRANS Place 1 patch onto the skin every Thursday.   doxepin 10 MG/ML solution Commonly known as: SINEQUAN Take 25 mg by mouth in the morning and at bedtime. Rinse/Spit twice daily as needed for throat pain   empagliflozin 25 MG Tabs tablet Commonly known as: JARDIANCE Take 12.5 mg by mouth.   free water Soln Place 100 mLs into feeding tube every 8 (eight) hours.   methimazole 10 MG tablet Commonly known as: TAPAZOLE Take 10 mg by mouth daily.   MiraLax 17 g packet Generic drug: polyethylene glycol 17 g daily as needed for moderate constipation or mild constipation.   oxyCODONE 5 MG/5ML solution Commonly known as: ROXICODONE Take 5 mg by  mouth every 4 (four) hours as needed for moderate pain (pain score 4-6) or severe pain (pain score 7-10).   polyvinyl alcohol 1.4 % ophthalmic solution Commonly known as: LIQUIFILM TEARS Place 1 drop into both eyes as needed for dry eyes.   silver sulfADIAZINE 1 % cream Commonly known as: SILVADENE Apply 1 Application topically See admin instructions. Apply a moderate amount topically as directed to moist desquamation of anterior neck daily   Ventolin HFA 108 (90 Base) MCG/ACT inhaler Generic drug: albuterol Inhale 1-2 puffs into the lungs every 6 (six) hours as needed for wheezing or shortness of breath.       Allergies  Allergen Reactions   Codeine Itching, Rash and Other (See Comments)    Veins pop out real bad   Tylox [Oxycodone-Acetaminophen] Itching   Oxycodone-Acetaminophen Itching   Tyloxapol Rash      The results of significant diagnostics from this hospitalization (including imaging, microbiology, ancillary and laboratory) are listed below for reference.    Significant Diagnostic Studies: DG ESOPHAGUS W SINGLE CM (SOL OR THIN BA) Result Date: 08/27/2023 CLINICAL DATA:  Pneumomediastinum seen on CT chest and abdomen. Recent history of percutaneous gastrostomy tube placement  nausea and vomiting. Request for esophagram to evaluate for injury/leak. EXAM: ESOPHAGUS/BARIUM SWALLOW/TABLET STUDY TECHNIQUE: Limited single contrast examination was performed using water-soluble contrast. This exam was performed by Brayton El PA-C , and was supervised and interpreted by Dr. Marliss Coots. FLUOROSCOPY: Radiation Exposure Index (as provided by the fluoroscopic device): 12.30 mGy Kerma COMPARISON:  None Available. FINDINGS: Esophagus: Normal appearance. No mucosal lesions or strictures. No evidence of contrast extravasation or evidence of perforation. Esophageal motility: Within normal limits. Hiatal Hernia: None. Gastroesophageal reflux: None visualized. Ingested 13 mm barium tablet: Not  given Other: Incidentally noted gastrostomy tube balloon has migrated into the pylorus, potentially causing mechanical gastric outlet obstruction, as seen on series 8. Gastrostomy tube was retracted until balloon snug against the abdominal wall. Bumper secured at this level. IMPRESSION: Negative esophagram for evidence of contrast extravasation or perforation. Gastrostomy tube now in appropriate position. Electronically Signed   By: Marliss Coots M.D.   On: 08/27/2023 13:12   CT Chest Wo Contrast Result Date: 08/26/2023 CLINICAL DATA:  Pneumomediastinum seen on CT abdomen. EXAM: CT CHEST WITHOUT CONTRAST TECHNIQUE: Multidetector CT imaging of the chest was performed following the standard protocol without IV contrast. RADIATION DOSE REDUCTION: This exam was performed according to the departmental dose-optimization program which includes automated exposure control, adjustment of the mA and/or kV according to patient size and/or use of iterative reconstruction technique. COMPARISON:  CT abdomen and pelvis earlier today FINDINGS: Cardiovascular: Normal heart size. No pericardial effusion. Coronary artery and aortic atherosclerotic calcification. Mediastinum/Nodes: Unchanged pneumomediastinum within the anterior mediastinum similar to same day CT abdomen and pelvis likely related to pneumomediastinum. No additional air or fluid within the mediastinum. Trachea and esophagus are unremarkable. No thoracic adenopathy. Lungs/Pleura: No focal consolidation, pleural effusion, or pneumothorax. Similar focal area of fat attenuation density along the left posterior pleural surface extending into the major fissure (series 2/image 49 several small pulmonary nodules measuring up to 4 mm (in the left lower lobe series 2/image 99). No follow-up is recommended. Vague 8 mm ground-glass nodule in the right upper lobe (series 2/image 67) is unchanged from 12/04/2015 compatible with benign etiology. No follow-up recommended. The airways  are patent. Upper Abdomen: Percutaneous gastrostomy tube. Similar pneumoperitoneum in the anterior abdomen Musculoskeletal: No acute fracture. IMPRESSION: 1. Unchanged pneumomediastinum within the anterior mediastinum similar to same day CT abdomen and pelvis likely related to pneumoperitoneum from gastrostomy tube placement. 2. Similar pneumoperitoneum in the anterior abdomen. 3. Aortic Atherosclerosis (ICD10-I70.0). Electronically Signed   By: Minerva Fester M.D.   On: 08/26/2023 20:01   CT ABDOMEN PELVIS W CONTRAST Result Date: 08/26/2023 CLINICAL DATA:  Postop abdominal pain. Vomiting. Concern for feeding tube displacement. EXAM: CT ABDOMEN AND PELVIS WITH CONTRAST TECHNIQUE: Multidetector CT imaging of the abdomen and pelvis was performed using the standard protocol following bolus administration of intravenous contrast. RADIATION DOSE REDUCTION: This exam was performed according to the departmental dose-optimization program which includes automated exposure control, adjustment of the mA and/or kV according to patient size and/or use of iterative reconstruction technique. CONTRAST:  OMNIPAQUE IOHEXOL 300 MG/ML  SOLN COMPARISON:  None recent. Abdominopelvic CT 09/28/2020 and 06/13/2019. FINDINGS: Lower chest: Clear lung bases. No significant pleural or pericardial effusion. There is no basilar pneumothorax. However, anterior pneumomediastinum noted. There is mild aortic atherosclerosis. Hepatobiliary: The liver is normal in density without suspicious focal abnormality. Stable 9 mm cystic lesion in the left hepatic lobe on image 14/2. No significant biliary dilatation status post cholecystectomy. Pancreas: Unremarkable. No pancreatic  ductal dilatation or surrounding inflammatory changes. Spleen: Normal in size without focal abnormality. Adrenals/Urinary Tract: Stable right adrenal adenoma measuring 1.5 cm on image 21/2. Although this has a nonspecific density on this contrast enhanced study, it measured  4 HU on previous CT consistent with a lipid rich adenoma. No follow-up recommended. The left adrenal gland appears normal. No evidence of urinary tract calculus, suspicious renal lesion or hydronephrosis. There are multiple small renal cystic lesions bilaterally for which no specific follow-up imaging is recommended. The bladder appears unremarkable for its degree of distention. Stomach/Bowel: No enteric contrast administered. There is a percutaneous gastrostomy tube with the retention bulb in the gastric antrum. The stomach is decompressed. There is a small amount of extraluminal air anteriorly in the upper abdomen between the anterior abdominal wall and the liver. There is no focal fluid collection. No bowel distension, wall thickening or surrounding inflammation. The appendix appears normal. Vascular/Lymphatic: There are no enlarged abdominal or pelvic lymph nodes. Aortic and branch vessel atherosclerosis without evidence of large vessel occlusion or aneurysm. The portal, superior mesenteric and splenic veins are patent. Reproductive: The prostate gland appears unremarkable. Other: Postsurgical changes in the anterior abdominal wall consistent with previous ventral hernia repair. No recurrent hernia identified. Stable prominent fat in both inguinal canals with extension of a small knuckle of small bowel into the right-sided hernia. No evidence of incarceration or obstruction. As above, small amount of pneumoperitoneum in the anterior upper abdomen which may relate to the patient's percutaneous G-tube, especially if recently placed. Musculoskeletal: No acute or significant osseous findings. Multilevel spondylosis. IMPRESSION: 1. The percutaneous gastrostomy tube appears appropriately positioned with the retention bulb in the gastric antrum. 2. Small amount of pneumoperitoneum in the anterior upper abdomen which may relate to the patient's percutaneous G-tube, especially if recently placed. Mild anterior  pneumomediastinum may be related. Suggest chest radiographic correlation. 3. No evidence of bowel obstruction or other acute findings. 4. Stable right adrenal adenoma. 5. Stable bilateral inguinal hernias containing fat and a small knuckle of small bowel on the right. No evidence of incarceration or obstruction. 6.  Aortic Atherosclerosis (ICD10-I70.0). Electronically Signed   By: Carey Bullocks M.D.   On: 08/26/2023 17:59    Microbiology: No results found for this or any previous visit (from the past 240 hours).   Labs: Basic Metabolic Panel: Recent Labs  Lab 08/26/23 1618 08/26/23 1645 08/28/23 0927  NA 138 136 136  K 4.5 5.7* 4.0  CL 100 104 103  CO2 27  --  25  GLUCOSE 120* 115* 94  BUN 42* 60* 22  CREATININE 1.50* 1.60* 1.09  CALCIUM 9.9  --  9.2  MG 2.2  --   --    Liver Function Tests: Recent Labs  Lab 08/26/23 1618 08/28/23 0927  AST 41 25  ALT 82* 49*  ALKPHOS 24* 18*  BILITOT 0.7 0.7  PROT 8.2* 6.2*  ALBUMIN 3.7 2.8*   Recent Labs  Lab 08/26/23 1618  LIPASE 60*   No results for input(s): "AMMONIA" in the last 168 hours. CBC: Recent Labs  Lab 08/26/23 1618 08/26/23 1645 08/28/23 0927  WBC 10.7*  --  6.4  NEUTROABS 8.4*  --  3.8  HGB 14.8 15.3 12.6*  HCT 43.9 45.0 35.9*  MCV 95.9  --  93.0  PLT 268  --  223   Cardiac Enzymes: No results for input(s): "CKTOTAL", "CKMB", "CKMBINDEX", "TROPONINI" in the last 168 hours. BNP: BNP (last 3 results) No results for input(s): "  BNP" in the last 8760 hours.  ProBNP (last 3 results) No results for input(s): "PROBNP" in the last 8760 hours.  CBG: Recent Labs  Lab 08/28/23 0750 08/28/23 1415 08/28/23 2057 08/29/23 0541 08/29/23 0758  GLUCAP 95 92 95 88 90    Signed:  Rhetta Mura MD   [facility] 08/29/2023, 8:24 AM

## 2023-08-29 NOTE — Progress Notes (Signed)
 Patient said will take all morning meds when gets home

## 2023-08-29 NOTE — Plan of Care (Signed)
 Problem: Education: Goal: Ability to describe self-care measures that may prevent or decrease complications (Diabetes Survival Skills Education) will improve 08/29/2023 0042 by Oneal Deputy, Rickard Rhymes Chem, RN Outcome: Progressing 08/29/2023 0042 by Corinda Gubler, RN Outcome: Progressing Goal: Individualized Educational Video(s) 08/29/2023 0042 by Corinda Gubler, RN Outcome: Progressing 08/29/2023 0042 by Paralee Cancel Chem, RN Outcome: Progressing   Problem: Coping: Goal: Ability to adjust to condition or change in health will improve 08/29/2023 0042 by Corinda Gubler, RN Outcome: Progressing 08/29/2023 0042 by Paralee Cancel Chem, RN Outcome: Progressing   Problem: Fluid Volume: Goal: Ability to maintain a balanced intake and output will improve 08/29/2023 0042 by Paralee Cancel Chem, RN Outcome: Progressing 08/29/2023 0042 by Paralee Cancel Chem, RN Outcome: Progressing   Problem: Health Behavior/Discharge Planning: Goal: Ability to identify and utilize available resources and services will improve 08/29/2023 0042 by Corinda Gubler, RN Outcome: Progressing 08/29/2023 0042 by Paralee Cancel Chem, RN Outcome: Progressing Goal: Ability to manage health-related needs will improve 08/29/2023 0042 by Corinda Gubler, RN Outcome: Progressing 08/29/2023 0042 by Paralee Cancel Chem, RN Outcome: Progressing   Problem: Metabolic: Goal: Ability to maintain appropriate glucose levels will improve 08/29/2023 0042 by Paralee Cancel Chem, RN Outcome: Progressing 08/29/2023 0042 by Paralee Cancel Chem, RN Outcome: Progressing   Problem: Nutritional: Goal: Maintenance of adequate nutrition will improve 08/29/2023 0042 by Paralee Cancel Chem, RN Outcome: Progressing 08/29/2023 0042 by Paralee Cancel Chem,  RN Outcome: Progressing Goal: Progress toward achieving an optimal weight will improve 08/29/2023 0042 by Paralee Cancel Chem, RN Outcome: Progressing 08/29/2023 0042 by Paralee Cancel Chem, RN Outcome: Progressing   Problem: Skin Integrity: Goal: Risk for impaired skin integrity will decrease 08/29/2023 0042 by Paralee Cancel Chem, RN Outcome: Progressing 08/29/2023 0042 by Paralee Cancel Chem, RN Outcome: Progressing   Problem: Tissue Perfusion: Goal: Adequacy of tissue perfusion will improve 08/29/2023 0042 by Paralee Cancel Chem, RN Outcome: Progressing 08/29/2023 0042 by Paralee Cancel Chem, RN Outcome: Progressing   Problem: Education: Goal: Knowledge of General Education information will improve Description: Including pain rating scale, medication(s)/side effects and non-pharmacologic comfort measures 08/29/2023 0042 by Paralee Cancel Chem, RN Outcome: Progressing 08/29/2023 0042 by Paralee Cancel Chem, RN Outcome: Progressing   Problem: Health Behavior/Discharge Planning: Goal: Ability to manage health-related needs will improve 08/29/2023 0042 by Corinda Gubler, RN Outcome: Progressing 08/29/2023 0042 by Paralee Cancel Chem, RN Outcome: Progressing   Problem: Clinical Measurements: Goal: Ability to maintain clinical measurements within normal limits will improve 08/29/2023 0042 by Corinda Gubler, RN Outcome: Progressing 08/29/2023 0042 by Paralee Cancel Chem, RN Outcome: Progressing Goal: Will remain free from infection 08/29/2023 0042 by Corinda Gubler, RN Outcome: Progressing 08/29/2023 0042 by Paralee Cancel Chem, RN Outcome: Progressing Goal: Diagnostic test results will improve 08/29/2023 0042 by Corinda Gubler, RN Outcome: Progressing 08/29/2023 0042 by Paralee Cancel Chem,  RN Outcome: Progressing Goal: Respiratory complications will improve 08/29/2023 0042 by Corinda Gubler, RN Outcome: Progressing 08/29/2023 0042 by Paralee Cancel Chem, RN Outcome: Progressing Goal: Cardiovascular complication will be avoided 08/29/2023 0042 by Corinda Gubler, RN Outcome: Progressing 08/29/2023 0042 by Paralee Cancel Chem, RN Outcome: Progressing   Problem: Activity: Goal: Risk for activity intolerance will decrease 08/29/2023 0042 by Paralee Cancel Chem, RN Outcome: Progressing 08/29/2023  4332 by Oneal Deputy, Rickard Rhymes Chem, RN Outcome: Progressing   Problem: Nutrition: Goal: Adequate nutrition will be maintained 08/29/2023 0042 by Paralee Cancel Chem, RN Outcome: Progressing 08/29/2023 0042 by Paralee Cancel Chem, RN Outcome: Progressing   Problem: Coping: Goal: Level of anxiety will decrease 08/29/2023 0042 by Oneal Deputy, Rickard Rhymes Chem, RN Outcome: Progressing 08/29/2023 0042 by Paralee Cancel Chem, RN Outcome: Progressing   Problem: Elimination: Goal: Will not experience complications related to bowel motility Outcome: Progressing Goal: Will not experience complications related to urinary retention Outcome: Progressing   Problem: Pain Managment: Goal: General experience of comfort will improve and/or be controlled Outcome: Progressing   Problem: Safety: Goal: Ability to remain free from injury will improve Outcome: Progressing   Problem: Skin Integrity: Goal: Risk for impaired skin integrity will decrease Outcome: Progressing

## 2023-08-29 NOTE — Progress Notes (Addendum)
 DISCHARGE NOTE HOME Gerald Salas. to be discharged Home per MD order. Discussed prescriptions and follow up appointments with the patient. Prescriptions given to patient; medication list explained in detail. Patient verbalized understanding.  Skin clean, dry and intact without evidence of skin break down, no evidence of skin tears noted. IV catheter discontinued intact. Site without signs and symptoms of complications. Dressing and pressure applied. Pt denies pain at the site currently. No complaints noted.  Patient free of lines, drains, and wounds other than noted on LDA  Peg tub abdomen  An After Visit Summary (AVS) was printed and given to the patient. Patient escorted via wheelchair, and discharged home via private auto.  Velia Meyer, RN

## 2024-01-30 ENCOUNTER — Other Ambulatory Visit: Payer: Self-pay

## 2024-01-30 ENCOUNTER — Emergency Department (HOSPITAL_COMMUNITY)
Admission: EM | Admit: 2024-01-30 | Discharge: 2024-01-30 | Disposition: A | Discharge status: Home / Self Care | Attending: Emergency Medicine | Admitting: Emergency Medicine

## 2024-01-30 ENCOUNTER — Emergency Department (HOSPITAL_COMMUNITY)

## 2024-01-30 ENCOUNTER — Encounter (HOSPITAL_COMMUNITY): Payer: Self-pay

## 2024-01-30 DIAGNOSIS — Z7982 Long term (current) use of aspirin: Secondary | ICD-10-CM | POA: Diagnosis not present

## 2024-01-30 DIAGNOSIS — E039 Hypothyroidism, unspecified: Secondary | ICD-10-CM | POA: Diagnosis not present

## 2024-01-30 DIAGNOSIS — Z7984 Long term (current) use of oral hypoglycemic drugs: Secondary | ICD-10-CM | POA: Diagnosis not present

## 2024-01-30 DIAGNOSIS — E119 Type 2 diabetes mellitus without complications: Secondary | ICD-10-CM | POA: Insufficient documentation

## 2024-01-30 DIAGNOSIS — J029 Acute pharyngitis, unspecified: Secondary | ICD-10-CM | POA: Diagnosis present

## 2024-01-30 DIAGNOSIS — J449 Chronic obstructive pulmonary disease, unspecified: Secondary | ICD-10-CM | POA: Diagnosis not present

## 2024-01-30 DIAGNOSIS — R059 Cough, unspecified: Secondary | ICD-10-CM | POA: Diagnosis not present

## 2024-01-30 LAB — CBC WITH DIFFERENTIAL/PLATELET
Abs Immature Granulocytes: 0.02 K/uL (ref 0.00–0.07)
Basophils Absolute: 0 K/uL (ref 0.0–0.1)
Basophils Relative: 0 %
Eosinophils Absolute: 0 K/uL (ref 0.0–0.5)
Eosinophils Relative: 0 %
HCT: 43.6 % (ref 39.0–52.0)
Hemoglobin: 14.9 g/dL (ref 13.0–17.0)
Immature Granulocytes: 0 %
Lymphocytes Relative: 12 %
Lymphs Abs: 0.8 K/uL (ref 0.7–4.0)
MCH: 33 pg (ref 26.0–34.0)
MCHC: 34.2 g/dL (ref 30.0–36.0)
MCV: 96.5 fL (ref 80.0–100.0)
Monocytes Absolute: 0.3 K/uL (ref 0.1–1.0)
Monocytes Relative: 4 %
Neutro Abs: 5.8 K/uL (ref 1.7–7.7)
Neutrophils Relative %: 84 %
Platelets: 186 K/uL (ref 150–400)
RBC: 4.52 MIL/uL (ref 4.22–5.81)
RDW: 12.2 % (ref 11.5–15.5)
WBC: 6.9 K/uL (ref 4.0–10.5)
nRBC: 0 % (ref 0.0–0.2)

## 2024-01-30 LAB — BASIC METABOLIC PANEL WITH GFR
Anion gap: 11 (ref 5–15)
BUN: 28 mg/dL — ABNORMAL HIGH (ref 8–23)
CO2: 19 mmol/L — ABNORMAL LOW (ref 22–32)
Calcium: 9.1 mg/dL (ref 8.9–10.3)
Chloride: 109 mmol/L (ref 98–111)
Creatinine, Ser: 1.11 mg/dL (ref 0.61–1.24)
GFR, Estimated: >60 mL/min (ref >60)
Glucose, Bld: 144 mg/dL — ABNORMAL HIGH (ref 70–99)
Potassium: 4.3 mmol/L (ref 3.5–5.1)
Sodium: 139 mmol/L (ref 135–145)

## 2024-01-30 MED ORDER — ALUM & MAG HYDROXIDE-SIMETH 200-200-20 MG/5ML PO SUSP
30.0000 mL | Freq: Once | ORAL | Status: AC
  Administered 2024-01-30: 30 mL via ORAL
  Filled 2024-01-30: qty 30

## 2024-01-30 MED ORDER — AMOXICILLIN 250 MG PO CAPS
500.0000 mg | ORAL_CAPSULE | Freq: Once | ORAL | Status: AC
  Administered 2024-01-30: 500 mg via ORAL
  Filled 2024-01-30: qty 2

## 2024-01-30 MED ORDER — ONDANSETRON HCL 4 MG/2ML IJ SOLN
4.0000 mg | Freq: Once | INTRAMUSCULAR | Status: AC
  Administered 2024-01-30: 4 mg via INTRAVENOUS
  Filled 2024-01-30: qty 2

## 2024-01-30 MED ORDER — AMOXICILLIN 500 MG PO CAPS: 500.0000 mg | ORAL_CAPSULE | Freq: Three times a day (TID) | ORAL | 0 refills | Status: AC

## 2024-01-30 MED ORDER — LIDOCAINE VISCOUS HCL 2 % MT SOLN: 10.0000 mL | OROMUCOSAL | 0 refills | Status: AC | PRN

## 2024-01-30 MED ORDER — LIDOCAINE VISCOUS HCL 2 % MT SOLN
15.0000 mL | Freq: Once | OROMUCOSAL | Status: AC
  Administered 2024-01-30: 15 mL via ORAL
  Filled 2024-01-30: qty 15

## 2024-01-30 MED ORDER — FENTANYL CITRATE (PF) 100 MCG/2ML IJ SOLN
50.0000 ug | Freq: Once | INTRAMUSCULAR | Status: AC
  Administered 2024-01-30: 50 ug via INTRAVENOUS
  Filled 2024-01-30: qty 2

## 2024-01-30 MED ORDER — IOHEXOL 300 MG/ML  SOLN
75.0000 mL | Freq: Once | INTRAMUSCULAR | Status: AC | PRN
  Administered 2024-01-30: 75 mL via INTRAVENOUS

## 2024-01-30 NOTE — ED Notes (Signed)
 ED Provider at bedside.

## 2024-01-30 NOTE — Discharge Instructions (Signed)
 Your imaging tonight is reassuring, there is no evidence for a return of your mass.  However it does appear you have significant inflammation therefore finish the prednisone you are currently taking.  There is possibility of infection as well therefore you are being started on amoxicillin .  I also recommend using the lidocaine  I have prescribed you, I recommend mixing in equal parts of Maalox with both lidocaine , 1 teaspoon of lidocaine  mixed with 1 teaspoon of Maalox which you can swallow every 3-6 hours as needed for throat pain relief.  Plan follow-up with your primary provider if your symptoms persist or worsen.

## 2024-01-30 NOTE — ED Notes (Signed)
 Patient transported to CT

## 2024-01-30 NOTE — ED Triage Notes (Signed)
 Patient reports pain on left side of throat and in left ear. 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  Went to [facility] and was told he has swelling in his throat.  Patient was prescribed prednisone but not getting better.  Reports he is having a hard time swallowing.

## 2024-01-30 NOTE — ED Notes (Signed)
 The patients throat does not have any redness or white patches. No swelling noted.

## 2024-02-02 NOTE — ED Provider Notes (Signed)
 [place] [facility] AT [facility] Provider Note   CSN: [id number] Arrival date & time: 01/30/24  8666     Patient presents with: Sore Throat   Gerald Salas. is a 73 y.o. male who has a history of GERD, hypothyroidism, COPD, TIA and type 2 diabetes, also history is predominantly significant for prior history of laryngeal cancer who underwent chemotherapy and radiation which ended February of this year.  He has had slowly increasing pain in his throat and states he saw an ENT through the [facility] recently and was told he was having increased swelling in his throat and is pending further evaluation at this time.  In the interim he was placed on a prednisone course which he is currently taking which has not been helpful.  He reports pain with swallowing, also describes episodes where he feels his throat is tightening, especially when he is lying down to sleep.  He denies shortness of breath, fevers or chills,  chest pain.  He does have nonproductive cough.   The history is provided by the patient.       Prior to Admission medications   Medication Sig Start Date End Date Taking? Authorizing Provider  amoxicillin  (AMOXIL ) 500 MG capsule Take 1 capsule (500 mg total) by mouth 3 (three) times daily. 01/30/24  Yes Jacobs Golab, Mliss, PA-C  lidocaine  (XYLOCAINE ) 2 % solution Use as directed 10 mLs in the mouth or throat every 3 (three) hours as needed (throat pain). 01/30/24  Yes Javanna Patin, Mliss, PA-C  albuterol  (VENTOLIN  HFA) 108 (90 Base) MCG/ACT inhaler Inhale 1-2 puffs into the lungs every 6 (six) hours as needed for wheezing or shortness of breath. 03/20/17   [provider]  aspirin  EC 81 MG tablet Take 81 mg by mouth daily. Swallow whole.    [provider]  atorvastatin  (LIPITOR) 80 MG tablet Take 1 tablet by mouth at bedtime. 07/25/23   [provider]  bisacodyl (DULCOLAX) 5 MG EC tablet Take 10 mg by mouth daily. 08/21/23   [provider]   buprenorphine ORVIS) 10 MCG/HR PTWK Place 1 patch onto the skin every Thursday. 08/21/23   [provider]  doxepin  (SINEQUAN ) 10 MG/ML solution Take 25 mg by mouth in the morning and at bedtime. Rinse/Spit twice daily as needed for throat pain 08/21/23   [provider]  empagliflozin (JARDIANCE) 25 MG TABS tablet Take 12.5 mg by mouth. 07/25/23   [provider]  methimazole  (TAPAZOLE ) 10 MG tablet Take 10 mg by mouth daily. 01/30/23   [provider]  oxyCODONE  (ROXICODONE ) 5 MG/5ML solution Take 5 mg by mouth every 4 (four) hours as needed for moderate pain (pain score 4-6) or severe pain (pain score 7-10). 08/21/23   [provider]  polyethylene glycol (MIRALAX ) 17 g packet 17 g daily as needed for moderate constipation or mild constipation. 08/21/23   [provider]  polyvinyl alcohol (LIQUIFILM TEARS) 1.4 % ophthalmic solution Place 1 drop into both eyes as needed for dry eyes. 11/28/12   [provider]  silver  sulfADIAZINE  (SILVADENE ) 1 % cream Apply 1 Application topically See admin instructions. Apply a moderate amount topically as directed to moist desquamation of anterior neck daily 08/09/23   [provider]  Water  For Irrigation, Sterile (FREE WATER ) SOLN Place 100 mLs into feeding tube every 8 (eight) hours. 08/29/23   Samtani, Jai-Gurmukh, MD    Allergies: Codeine, Tylox [oxycodone -acetaminophen ], Oxycodone -acetaminophen , and Tyloxapol    Review of Systems  Constitutional:  Negative for chills and fever.  HENT:  Positive for sore throat and trouble swallowing. Negative for congestion.   Eyes: Negative.   Respiratory:  Positive for cough. Negative for choking, chest tightness, shortness of breath, wheezing and stridor.   Cardiovascular:  Negative for chest pain.  Gastrointestinal:  Negative for abdominal pain and nausea.  Genitourinary: Negative.   Musculoskeletal:  Negative for arthralgias, joint swelling and  neck pain.  Skin: Negative.  Negative for rash and wound.  Neurological:  Negative for dizziness, weakness, light-headedness, numbness and headaches.  Psychiatric/Behavioral: Negative.      Updated Vital Signs BP (!) 112/53   Pulse (!) 55   Temp 98 F (36.7 C)   Resp 17   Ht 5' 11 (1.803 m)   Wt 70.8 kg   SpO2 96%   BMI 21.76 kg/m   Physical Exam Vitals and nursing note reviewed.  Constitutional:      Appearance: He is well-developed.  HENT:     Head: Normocephalic and atraumatic.     Mouth/Throat:     Mouth: Mucous membranes are moist.     Pharynx: Oropharynx is clear. No oropharyngeal exudate or posterior oropharyngeal erythema.     Tonsils: No tonsillar exudate.     Comments: Hoarseness of voice.  Posterior pharynx is widely patent.  Mild erythema noted posterior wall. Eyes:     Conjunctiva/sclera: Conjunctivae normal.  Cardiovascular:     Rate and Rhythm: Normal rate and regular rhythm.     Heart sounds: Normal heart sounds.  Pulmonary:     Effort: Pulmonary effort is normal.     Breath sounds: Normal breath sounds. No stridor. No wheezing.  Musculoskeletal:        General: Normal range of motion.     Cervical back: Normal range of motion.  Skin:    General: Skin is warm and dry.  Neurological:     Mental Status: He is alert.     (all labs ordered are listed, but only abnormal results are displayed) Labs Reviewed  BASIC METABOLIC PANEL WITH GFR - Abnormal; Notable for the following components:      Result Value   CO2 19 (*)    Glucose, Bld 144 (*)    BUN 28 (*)    All other components within normal limits  CBC WITH DIFFERENTIAL/PLATELET    EKG: None  Radiology: No results found. Results for orders placed or performed during the hospital encounter of 01/30/24  CBC with Differential   Collection Time: 01/30/24  3:26 PM  Result Value Ref Range   WBC 6.9 4.0 - 10.5 K/uL   RBC 4.52 4.22 - 5.81 MIL/uL   Hemoglobin 14.9 13.0 - 17.0 g/dL   HCT 56.3  60.9 - 47.9 %   MCV 96.5 80.0 - 100.0 fL   MCH 33.0 26.0 - 34.0 pg   MCHC 34.2 30.0 - 36.0 g/dL   RDW 87.7 88.4 - 84.4 %   Platelets 186 150 - 400 K/uL   nRBC 0.0 0.0 - 0.2 %   Neutrophils Relative % 84 %   Neutro Abs 5.8 1.7 - 7.7 K/uL   Lymphocytes Relative 12 %   Lymphs Abs 0.8 0.7 - 4.0 K/uL   Monocytes Relative 4 %   Monocytes Absolute 0.3 0.1 - 1.0 K/uL   Eosinophils Relative 0 %   Eosinophils Absolute 0.0 0.0 - 0.5 K/uL   Basophils Relative 0 %   Basophils Absolute 0.0 0.0 - 0.1 K/uL  Immature Granulocytes 0 %   Abs Immature Granulocytes 0.02 0.00 - 0.07 K/uL  Basic metabolic panel   Collection Time: 01/30/24  3:26 PM  Result Value Ref Range   Sodium 139 135 - 145 mmol/L   Potassium 4.3 3.5 - 5.1 mmol/L   Chloride 109 98 - 111 mmol/L   CO2 19 (L) 22 - 32 mmol/L   Glucose, Bld 144 (H) 70 - 99 mg/dL   BUN 28 (H) 8 - 23 mg/dL   Creatinine, Ser 8.88 0.61 - 1.24 mg/dL   Calcium  9.1 8.9 - 10.3 mg/dL   GFR, Estimated >39 >39 mL/min   Anion gap 11 5 - 15   CT Soft Tissue Neck W Contrast Result Date: 01/30/2024 CLINICAL DATA:  Palate weakness (CN 9) s/p radiation for laryngeal CA. increased difficulty swallowing over past several weeks. EXAM: CT NECK WITH CONTRAST TECHNIQUE: Multidetector CT imaging of the neck was performed using the standard protocol following the bolus administration of intravenous contrast. RADIATION DOSE REDUCTION: This exam was performed according to the departmental dose-optimization program which includes automated exposure control, adjustment of the mA and/or kV according to patient size and/or use of iterative reconstruction technique. CONTRAST:  75mL OMNIPAQUE  IOHEXOL  300 MG/ML  SOLN COMPARISON:  None Available. FINDINGS: Pharynx and larynx: Mildly edematous appearance of the larynx. Closed glottis. No visible mass lesion. No fluid collection. Unremarkable oropharynx. Salivary glands: No inflammation, mass, or stone. Thyroid: Normal. Lymph nodes: None  enlarged or abnormal density. Vascular: Not well assessed due to non arterial timing. Limited intracranial: Negative. Visualized orbits: Negative. Mastoids and visualized paranasal sinuses: Clear. Skeleton: No acute or aggressive process. Upper chest: Lung apices are clear. IMPRESSION: 1. Mildly edematous appearance of the larynx, which is nonspecific but could relate to post treatment related change given the provided history. Superimposed infection or inflammation is not excluded and there is no prior for direct comparison. No fluid collection. 2. No visible mass lesion; however, recommend routine mucosal surveillance (particularly if there has been a clinical change). Electronically Signed   By: Gilmore GORMAN Molt M.D.   On: 01/30/2024 22:36     Procedures   Medications Ordered in the ED  ondansetron  (ZOFRAN ) injection 4 mg (4 mg Intravenous Given 01/30/24 1527)  fentaNYL  (SUBLIMAZE ) injection 50 mcg (50 mcg Intravenous Given 01/30/24 1531)  iohexol  (OMNIPAQUE ) 300 MG/ML solution 75 mL (75 mLs Intravenous Contrast Given 01/30/24 2020)  alum & mag hydroxide-simeth (MAALOX/MYLANTA) 200-200-20 MG/5ML suspension 30 mL (30 mLs Oral Given 01/30/24 2307)    And  lidocaine  (XYLOCAINE ) 2 % viscous mouth solution 15 mL (15 mLs Oral Given 01/30/24 2307)  amoxicillin  (AMOXIL ) capsule 500 mg (500 mg Oral Given 01/30/24 2308)                                    Medical Decision Making Patient presenting with worsening pharyngitis and globus sensation, particularly when supine, status post laryngeal cancer treatment.  Currently on prednisone under [facility] care [facility] without symptomatic improvement.  Differential diagnosis including recurrence of tumor, postradiation inflammation, infection.  Imaging obtained as outlined below and reassuring with no evidence for recurrence of tumor, there is some inflammation, cannot rule out an infectious process but no abscess is present.  He was given a GI cocktail along  with prescription for viscous lidocaine , he can gargle and spit with this prior to meals, may also consider mixing it  with Maalox, advised to continue his prednisone prescribed by ENT, Amoxil  added to cover him for possible infection.  He was encouraged close follow-up with his specialist at the [facility], return precautions also outlined for worsening symptoms.  Amount and/or Complexity of Data Reviewed Labs:     Details: CBC is normal at with the WBC of 6.9, be met glucose 144, BUN 28, CO2 19, normal anion gap at 11. Radiology: ordered.    Details: CT soft tissue neck with results per above.  Risk OTC drugs. Prescription drug management.        Final diagnoses:  Acute pharyngitis, unspecified etiology    ED Discharge Orders          Ordered    amoxicillin  (AMOXIL ) 500 MG capsule  3 times daily        01/30/24 2301    lidocaine  (XYLOCAINE ) 2 % solution  Every  3 hours PRN        01/30/24 2301               Vanity Larsson, PA-C 02/02/24 1630    Zammit, Joseph, MD 02/03/24 1735

## 2024-02-17 ENCOUNTER — Other Ambulatory Visit: Payer: Self-pay

## 2024-02-17 ENCOUNTER — Emergency Department (HOSPITAL_COMMUNITY)

## 2024-02-17 ENCOUNTER — Emergency Department (HOSPITAL_COMMUNITY): Admission: EM | Admit: 2024-02-17 | Discharge: 2024-02-17 | Disposition: A | Discharge status: Home / Self Care

## 2024-02-17 DIAGNOSIS — S0990XA Unspecified injury of head, initial encounter: Secondary | ICD-10-CM | POA: Diagnosis not present

## 2024-02-17 DIAGNOSIS — S5001XA Contusion of right elbow, initial encounter: Secondary | ICD-10-CM | POA: Diagnosis not present

## 2024-02-17 DIAGNOSIS — W19XXXA Unspecified fall, initial encounter: Secondary | ICD-10-CM

## 2024-02-17 DIAGNOSIS — Z7982 Long term (current) use of aspirin: Secondary | ICD-10-CM | POA: Insufficient documentation

## 2024-02-17 DIAGNOSIS — W01198A Fall on same level from slipping, tripping and stumbling with subsequent striking against other object, initial encounter: Secondary | ICD-10-CM | POA: Insufficient documentation

## 2024-02-17 DIAGNOSIS — S8001XA Contusion of right knee, initial encounter: Secondary | ICD-10-CM | POA: Diagnosis not present

## 2024-02-17 DIAGNOSIS — Z79899 Other long term (current) drug therapy: Secondary | ICD-10-CM | POA: Diagnosis not present

## 2024-02-17 DIAGNOSIS — S8991XA Unspecified injury of right lower leg, initial encounter: Secondary | ICD-10-CM | POA: Diagnosis present

## 2024-02-17 DIAGNOSIS — M542 Cervicalgia: Secondary | ICD-10-CM | POA: Insufficient documentation

## 2024-02-17 DIAGNOSIS — Y92480 Sidewalk as the place of occurrence of the external cause: Secondary | ICD-10-CM | POA: Insufficient documentation

## 2024-02-17 MED ORDER — ONDANSETRON HCL 4 MG/2ML IJ SOLN
4.0000 mg | Freq: Once | INTRAMUSCULAR | Status: AC
  Administered 2024-02-17: 4 mg via INTRAVENOUS
  Filled 2024-02-17: qty 2

## 2024-02-17 MED ORDER — MORPHINE SULFATE (PF) 4 MG/ML IV SOLN
4.0000 mg | Freq: Once | INTRAVENOUS | Status: AC
  Administered 2024-02-17: 4 mg via INTRAVENOUS
  Filled 2024-02-17: qty 1
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
## 2024-02-17 NOTE — Discharge Instructions (Addendum)
 Please follow-up closely with your primary care doctor on an outpatient basis.  Return to emergency department immediately for any new or worsening symptoms.

## 2024-02-17 NOTE — ED Provider Notes (Signed)
 [facility] AT [facility] Provider Note   CSN: [id number] Arrival date & time: 02/17/24  1157     Patient presents with: No chief complaint on file.   Gerald Salas. is a 73 y.o. male.   Patient is a 73 year old male who presents emergency department with chief complaint of headache, neck pain, right elbow pain and right knee pain.  Patient notes that he was walking through a parking lot just prior to arrival when he tripped over a speed bump.  He notes that he is not currently on any anticoagulation but does take a baby aspirin  daily.  There was no associated syncope.  He denies any active dizziness or lightheadedness.  He denies any numbness, paresthesias unilateral weakness.  There is no associated chest pain or abdominal pain.  The fall was mechanical in nature with no preceding symptoms.  He denies any pain to his back at this time.        Prior to Admission medications   Medication Sig Start Date End Date Taking? Authorizing Provider  albuterol  (VENTOLIN  HFA) 108 (90 Base) MCG/ACT inhaler Inhale 1-2 puffs into the lungs every 6 (six) hours as needed for wheezing or shortness of breath. 03/20/17   [provider]  amoxicillin  (AMOXIL ) 500 MG capsule Take 1 capsule (500 mg total) by mouth 3 (three) times daily. 01/30/24   Idol, Julie, PA-C  aspirin  EC 81 MG tablet Take 81 mg by mouth daily. Swallow whole.    [provider]  atorvastatin  (LIPITOR) 80 MG tablet Take 1 tablet by mouth at bedtime. 07/25/23   [provider]  bisacodyl (DULCOLAX) 5 MG EC tablet Take 10 mg by mouth daily. 08/21/23   [provider]  buprenorphine ORVIS) 10 MCG/HR PTWK Place 1 patch onto the skin every Thursday. 08/21/23   [provider]  doxepin  (SINEQUAN ) 10 MG/ML solution Take 25 mg by mouth in the morning and at bedtime. Rinse/Spit twice daily as needed for throat pain 08/21/23   [provider]  empagliflozin  (JARDIANCE) 25 MG TABS tablet Take 12.5 mg by mouth. 07/25/23   [provider]  lidocaine  (XYLOCAINE ) 2 % solution Use as directed 10 mLs in the mouth or throat every 3 (three) hours as needed (throat pain). 01/30/24   Idol, Julie, PA-C  methimazole  (TAPAZOLE ) 10 MG tablet Take 10 mg by mouth daily. 01/30/23   [provider]  oxyCODONE  (ROXICODONE ) 5 MG/5ML solution Take 5 mg by mouth every 4 (four) hours as needed for moderate pain (pain score 4-6) or severe pain (pain score 7-10). 08/21/23   [provider]  polyethylene glycol (MIRALAX ) 17 g packet 17 g daily as needed for moderate constipation or mild constipation. 08/21/23   [provider]  polyvinyl alcohol (LIQUIFILM TEARS) 1.4 % ophthalmic solution Place 1 drop into both eyes as needed for dry eyes. 11/28/12   [provider]  silver  sulfADIAZINE  (SILVADENE ) 1 % cream Apply 1 Application topically See admin instructions. Apply a moderate amount topically as directed to moist desquamation of anterior neck daily 08/09/23   [provider]  Water  For Irrigation, Sterile (FREE WATER ) SOLN Place 100 mLs into feeding tube every 8 (eight) hours. 08/29/23   Samtani, Jai-Gurmukh, MD    Allergies: Codeine, Tylox [oxycodone -acetaminophen ], Oxycodone -acetaminophen , and Tyloxapol    Review of Systems  Musculoskeletal:  Positive for neck pain.       Pain to right elbow and right knee  Neurological:  Positive for headaches.  All other systems reviewed and are negative.   Updated Vital Signs BP 116/73   Pulse 62   Temp 97.8 F (36.6 C) (Oral)   Resp 12   Ht 5' 11 (1.803 m)   Wt 82.3 kg   SpO2 94%   BMI 25.31 kg/m   Physical Exam Vitals and nursing note reviewed.  Constitutional:      General: He is not in acute distress.    Appearance: Normal appearance. He is not ill-appearing.  HENT:     Head: Normocephalic and atraumatic.     Nose: Nose normal.     Mouth/Throat:     Mouth: Mucous  membranes are moist.  Eyes:     Extraocular Movements: Extraocular movements intact.     Conjunctiva/sclera: Conjunctivae normal.     Pupils: Pupils are equal, round, and reactive to light.  Neck:     Comments: Mild midline tenderness, no step-off or deformity Cardiovascular:     Rate and Rhythm: Normal rate and regular rhythm.     Pulses: Normal pulses.     Heart sounds: Normal heart sounds. No murmur heard.    No gallop.  Pulmonary:     Effort: Pulmonary effort is normal. No respiratory distress.     Breath sounds: Normal breath sounds. No stridor. No wheezing, rhonchi or rales.  Chest:     Chest wall: No tenderness.  Abdominal:     General: Abdomen is flat. Bowel sounds are normal. There is no distension.     Palpations: Abdomen is soft.     Tenderness: There is no abdominal tenderness. There is no guarding.  Musculoskeletal:        General: Normal range of motion.     Cervical back: Normal range of motion and neck supple. No rigidity.     Comments: Tender palpation noted over right elbow and right knee, nontender palpation remainder bilateral extremities, full range of motion noted throughout, pelvis stable to AP and lateral compression, radial pulse 2+ upper extremities, DP and PT pulses 2+ in bilateral lower extremities, sensation intact distally, no obvious deformity or bruising, no skin breakdown or ulceration, no lacerations or abrasions, nontender palpation over thoracic and lumbar spine, no step-off or deformity, no CVA tenderness  Skin:    General: Skin is warm and dry.     Findings: No bruising or rash.  Neurological:     General: No focal deficit present.     Mental Status: He is alert and oriented to person, place, and time. Mental status is at baseline.     Cranial Nerves: No cranial nerve deficit.     Sensory: No sensory deficit.     Motor: No weakness.     Coordination: Coordination normal.     Gait: Gait normal.  Psychiatric:        Mood and Affect: Mood  normal.        Behavior: Behavior normal.        Thought Content: Thought content normal.        Judgment: Judgment normal.     (all labs ordered are listed, but only abnormal results are displayed) Labs Reviewed - No data to display  EKG: None  Radiology: CT Head Wo Contrast Result Date: 02/17/2024 CLINICAL DATA:  Ataxia. Cervical trauma. Patient tripped and fell striking head on pavement. Bad headache. EXAM: CT HEAD WITHOUT CONTRAST CT CERVICAL SPINE WITHOUT CONTRAST TECHNIQUE: Multidetector CT imaging of the head and cervical spine was performed following the  standard protocol without intravenous contrast. Multiplanar CT image reconstructions of the cervical spine were also generated. RADIATION DOSE REDUCTION: This exam was performed according to the departmental dose-optimization program which includes automated exposure control, adjustment of the mA and/or kV according to patient size and/or use of iterative reconstruction technique. COMPARISON:  Head CT 02/26/2015.  CT neck with contrast 01/30/2024 FINDINGS: CT HEAD FINDINGS Brain: There is no evidence for acute hemorrhage, hydrocephalus, mass lesion, or abnormal extra-axial fluid collection. No definite CT evidence for acute infarction. Vascular: No hyperdense vessel or unexpected calcification. Skull: No evidence for fracture. No worrisome lytic or sclerotic lesion. Sinuses/Orbits: The visualized paranasal sinuses and mastoid air cells are clear. Visualized portions of the globes and intraorbital fat are unremarkable. Other: None. CT CERVICAL SPINE FINDINGS Alignment: Straightening of normal cervical lordosis. No evidence for traumatic subluxation. Skull base and vertebrae: No acute fracture. No primary bone lesion or focal pathologic process. Soft tissues and spinal canal: No prevertebral fluid or swelling. No visible canal hematoma. Soft tissue thickening in the laryngeal region is similar to prior. Disc levels: Loss of disc height with  endplate degeneration noted C4-5 and C5-6. Patient is chronically fused across the C6-7 interspace. Facets are well aligned bilaterally. No prevertebral soft tissue swelling. Upper chest: Unremarkable. Other: None. IMPRESSION: 1. No acute intracranial abnormality. 2. Degenerative changes in the cervical spine without fracture or traumatic subluxation. 3. Soft tissue thickening noted in the lower into region, similar to recent neck CT with contrast. By report patient has had radiation therapy for laryngeal cancer. Electronically Signed   By: Camellia Candle M.D.   On: 02/17/2024 13:41   CT Cervical Spine Wo Contrast Result Date: 02/17/2024 CLINICAL DATA:  Ataxia. Cervical trauma. Patient tripped and fell striking head on pavement. Bad headache. EXAM: CT HEAD WITHOUT CONTRAST CT CERVICAL SPINE WITHOUT CONTRAST TECHNIQUE: Multidetector CT imaging of the head and cervical spine was performed following the standard protocol without intravenous contrast. Multiplanar CT image reconstructions of the cervical spine were also generated. RADIATION DOSE REDUCTION: This exam was performed according to the departmental dose-optimization program which includes automated exposure control, adjustment of the mA and/or kV according to patient size and/or use of iterative reconstruction technique. COMPARISON:  Head CT 02/26/2015.  CT neck with contrast 01/30/2024 FINDINGS: CT HEAD FINDINGS Brain: There is no evidence for acute hemorrhage, hydrocephalus, mass lesion, or abnormal extra-axial fluid collection. No definite CT evidence for acute infarction. Vascular: No hyperdense vessel or unexpected calcification. Skull: No evidence for fracture. No worrisome lytic or sclerotic lesion. Sinuses/Orbits: The visualized paranasal sinuses and mastoid air cells are clear. Visualized portions of the globes and intraorbital fat are unremarkable. Other: None. CT CERVICAL SPINE FINDINGS Alignment: Straightening of normal cervical lordosis. No  evidence for traumatic subluxation. Skull base and vertebrae: No acute fracture. No primary bone lesion or focal pathologic process. Soft tissues and spinal canal: No prevertebral fluid or swelling. No visible canal hematoma. Soft tissue thickening in the laryngeal region is similar to prior. Disc levels: Loss of disc height with endplate degeneration noted C4-5 and C5-6. Patient is chronically fused across the C6-7 interspace. Facets are well aligned bilaterally. No prevertebral soft tissue swelling. Upper chest: Unremarkable. Other: None. IMPRESSION: 1. No acute intracranial abnormality. 2. Degenerative changes in the cervical spine without fracture or traumatic subluxation. 3. Soft tissue thickening noted in the lower into region, similar to recent neck CT with contrast. By report patient has had radiation therapy for laryngeal cancer. Electronically Signed  By: Camellia Candle M.D.   On: 02/17/2024 13:41   DG Knee Complete 4 Views Right Result Date: 02/17/2024 CLINICAL DATA:  Pain after fall. EXAM: RIGHT KNEE - COMPLETE 4+ VIEW COMPARISON:  11/02/2018. FINDINGS: No acute fracture dislocation. No joint effusion. Mild osteoarthritis with chondrocalcinosis in the medial and lateral femorotibial compartments. Soft tissues are unremarkable. IMPRESSION: 1. No acute osseous abnormality. 2. Mild osteoarthritis with chondrocalcinosis in the medial and lateral femorotibial compartments. Electronically Signed   By: Harrietta Sherry M.D.   On: 02/17/2024 13:24   DG Elbow Complete Right Result Date: 02/17/2024 CLINICAL DATA:  Pain after fall. EXAM: RIGHT ELBOW - COMPLETE 3+ VIEW COMPARISON:  None Available. FINDINGS: There is no evidence of acute fracture, dislocation, or sizable joint effusion. Soft tissues are unremarkable. No radiopaque foreign body. IMPRESSION: Negative. Electronically Signed   By: Harrietta Sherry M.D.   On: 02/17/2024 13:22     Procedures   Medications Ordered in the ED  morphine  (PF) 4  MG/ML injection 4 mg (4 mg Intravenous Given 02/17/24 1237)  ondansetron  (ZOFRAN ) injection 4 mg (4 mg Intravenous Given 02/17/24 1237)    Clinical Course as of 02/17/24 1355  Sat Feb 17, 2024  1354 CT Cervical Spine Wo Contrast [CR]    Clinical Course User Index [CR] Daralene Lonni BIRCH, PA-C                                 Medical Decision Making Amount and/or Complexity of Data Reviewed Radiology: ordered.  Risk Prescription drug management.   This patient presents to the ED for concern of headache, neck pain, right elbow and right knee pain differential diagnosis includes intracranial hemorrhage, vertebral fracture, lumbar and or joint fracture, contusion, sprain, strain    Additional history obtained:  Additional history obtained from none External records from outside source obtained and reviewed including none    Imaging Studies ordered:  I ordered imaging studies including CT scan of head, CT cervical spine, x-ray of right elbow and knee I independently visualized and interpreted imaging which showed no acute intracranial hemorrhage, no vertebral fracture, no acute osseous injury of right elbow or knee I agree with the radiologist interpretation   Medicines ordered and prescription drug management:  I ordered medication including morphine , Zofran  for acute traumatic pain Reevaluation of the patient after these medicines showed that the patient improved I have reviewed the patients home medicines and have made adjustments as needed   Problem List / ED Course:  Patient is doing well at this time and is stable for discharge home.  Pain has greatly improved with treatment in the emergency department.  Discussed with patient that all workup in the emergency department has been unremarkable.  He was nontender palpation of his thoracic or lumbar spine and had no CVA tenderness.  He had no tenderness palpation over chest wall and abdomen.  There was no associated  abdominal distention or bruising.  Patient had no other tenderness noted over bilateral upper and lower extremities.  He was neurovascularly intact throughout.  Do not suspect any further advanced imaging is warranted at this time.  Will place patient in a knee immobilizer.  He already has a walker and a cane at home and will use these.  Did discuss the need for close follow-up with his primary care doctor on an outpatient basis.  Strict turn precautions were discussed for any new or worsening symptoms.  Patient voiced understanding and had no additional questions.   Social Determinants of Health:  None        Final diagnoses:  Closed head injury, initial encounter  Contusion of right knee, initial encounter  Contusion of right elbow, initial encounter  Fall, initial encounter    ED Discharge Orders     None          Daralene Lonni JONETTA DEVONNA 02/17/24 1355    Simon Lavonia SAILOR, MD 02/18/24 [phone]
